# Patient Record
Sex: Female | Born: 1955 | Race: White | Hispanic: No | Smoking: Current some day smoker
Health system: Southern US, Community
[De-identification: ages and names within clinical notes are randomized; demographics above are authoritative.]

## PROBLEM LIST (undated history)

## (undated) DIAGNOSIS — G709 Myoneural disorder, unspecified: Secondary | ICD-10-CM

## (undated) DIAGNOSIS — I2109 ST elevation (STEMI) myocardial infarction involving other coronary artery of anterior wall: Secondary | ICD-10-CM

## (undated) DIAGNOSIS — E8881 Metabolic syndrome: Secondary | ICD-10-CM

## (undated) DIAGNOSIS — E669 Obesity, unspecified: Secondary | ICD-10-CM

## (undated) DIAGNOSIS — M1990 Unspecified osteoarthritis, unspecified site: Secondary | ICD-10-CM

## (undated) DIAGNOSIS — E039 Hypothyroidism, unspecified: Secondary | ICD-10-CM

## (undated) DIAGNOSIS — T7840XA Allergy, unspecified, initial encounter: Secondary | ICD-10-CM

## (undated) DIAGNOSIS — I509 Heart failure, unspecified: Secondary | ICD-10-CM

## (undated) DIAGNOSIS — F17200 Nicotine dependence, unspecified, uncomplicated: Secondary | ICD-10-CM

## (undated) DIAGNOSIS — E785 Hyperlipidemia, unspecified: Secondary | ICD-10-CM

## (undated) DIAGNOSIS — I2510 Atherosclerotic heart disease of native coronary artery without angina pectoris: Secondary | ICD-10-CM

## (undated) DIAGNOSIS — F32A Depression, unspecified: Secondary | ICD-10-CM

## (undated) DIAGNOSIS — R87619 Unspecified abnormal cytological findings in specimens from cervix uteri: Secondary | ICD-10-CM

## (undated) DIAGNOSIS — E119 Type 2 diabetes mellitus without complications: Secondary | ICD-10-CM

## (undated) DIAGNOSIS — I10 Essential (primary) hypertension: Secondary | ICD-10-CM

## (undated) DIAGNOSIS — F329 Major depressive disorder, single episode, unspecified: Secondary | ICD-10-CM

## (undated) HISTORY — DX: Depression, unspecified: F32.A

## (undated) HISTORY — DX: Essential (primary) hypertension: I10

## (undated) HISTORY — DX: Type 2 diabetes mellitus without complications: E11.9

## (undated) HISTORY — PX: TOE SURGERY: SHX1073

## (undated) HISTORY — DX: Unspecified abnormal cytological findings in specimens from cervix uteri: R87.619

## (undated) HISTORY — DX: Hyperlipidemia, unspecified: E78.5

## (undated) HISTORY — DX: Unspecified osteoarthritis, unspecified site: M19.90

## (undated) HISTORY — DX: Metabolic syndrome: E88.810

## (undated) HISTORY — DX: Atherosclerotic heart disease of native coronary artery without angina pectoris: I25.10

## (undated) HISTORY — DX: Major depressive disorder, single episode, unspecified: F32.9

## (undated) HISTORY — PX: CARDIAC CATHETERIZATION: SHX172

## (undated) HISTORY — DX: ST elevation (STEMI) myocardial infarction involving other coronary artery of anterior wall: I21.09

## (undated) HISTORY — PX: OTHER SURGICAL HISTORY: SHX169

## (undated) HISTORY — PX: ACHILLES TENDON SURGERY: SHX542

## (undated) HISTORY — DX: Hypothyroidism, unspecified: E03.9

## (undated) HISTORY — PX: EYE SURGERY: SHX253

## (undated) HISTORY — DX: Nicotine dependence, unspecified, uncomplicated: F17.200

## (undated) HISTORY — PX: HAND SURGERY: SHX662

## (undated) HISTORY — DX: Obesity, unspecified: E66.9

## (undated) HISTORY — DX: Myoneural disorder, unspecified: G70.9

## (undated) HISTORY — DX: Metabolic syndrome: E88.81

## (undated) HISTORY — DX: Allergy, unspecified, initial encounter: T78.40XA

## (undated) HISTORY — PX: TUBAL LIGATION: SHX77

---
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
## 1974-09-28 HISTORY — PX: KNEE ARTHROSCOPY: SUR90

## 1998-03-05 ENCOUNTER — Other Ambulatory Visit: Admission: RE | Admit: 1998-03-05 | Discharge: 1998-03-05 | Payer: Self-pay | Admitting: Gynecology

## 1998-03-19 ENCOUNTER — Other Ambulatory Visit: Admission: RE | Admit: 1998-03-19 | Discharge: 1998-03-19 | Payer: Self-pay | Admitting: Gynecology

## 1998-04-12 ENCOUNTER — Other Ambulatory Visit: Admission: RE | Admit: 1998-04-12 | Discharge: 1998-04-12 | Payer: Self-pay | Admitting: Gynecology

## 1998-05-14 ENCOUNTER — Other Ambulatory Visit: Admission: RE | Admit: 1998-05-14 | Discharge: 1998-05-14 | Payer: Self-pay | Admitting: Gynecology

## 1999-04-16 ENCOUNTER — Other Ambulatory Visit: Admission: RE | Admit: 1999-04-16 | Discharge: 1999-04-16 | Payer: Self-pay | Admitting: Gynecology

## 2000-06-11 ENCOUNTER — Inpatient Hospital Stay (HOSPITAL_COMMUNITY): Admission: EM | Admit: 2000-06-11 | Discharge: 2000-06-21 | Payer: Self-pay | Admitting: Emergency Medicine

## 2000-06-11 ENCOUNTER — Encounter: Payer: Self-pay | Admitting: Emergency Medicine

## 2000-06-11 ENCOUNTER — Encounter: Payer: Self-pay | Admitting: Cardiology

## 2000-06-13 ENCOUNTER — Encounter: Payer: Self-pay | Admitting: *Deleted

## 2000-06-28 ENCOUNTER — Other Ambulatory Visit: Admission: RE | Admit: 2000-06-28 | Discharge: 2000-06-28 | Payer: Self-pay | Admitting: Gynecology

## 2000-07-06 ENCOUNTER — Encounter (HOSPITAL_COMMUNITY): Admission: RE | Admit: 2000-07-06 | Discharge: 2000-10-04 | Payer: Self-pay | Admitting: Cardiology

## 2000-10-05 ENCOUNTER — Encounter (HOSPITAL_COMMUNITY): Admission: RE | Admit: 2000-10-05 | Discharge: 2001-01-03 | Payer: Self-pay | Admitting: Cardiology

## 2001-06-27 ENCOUNTER — Other Ambulatory Visit: Admission: RE | Admit: 2001-06-27 | Discharge: 2001-06-27 | Payer: Self-pay | Admitting: Gynecology

## 2002-02-10 ENCOUNTER — Encounter: Admission: RE | Admit: 2002-02-10 | Discharge: 2002-02-10 | Payer: Self-pay | Admitting: Orthopedic Surgery

## 2002-02-10 ENCOUNTER — Encounter: Payer: Self-pay | Admitting: Orthopedic Surgery

## 2002-07-11 ENCOUNTER — Other Ambulatory Visit: Admission: RE | Admit: 2002-07-11 | Discharge: 2002-07-11 | Payer: Self-pay | Admitting: Gynecology

## 2003-04-30 ENCOUNTER — Encounter: Admission: RE | Admit: 2003-04-30 | Discharge: 2003-07-29 | Payer: Self-pay | Admitting: Cardiology

## 2004-04-08 ENCOUNTER — Encounter (INDEPENDENT_AMBULATORY_CARE_PROVIDER_SITE_OTHER): Payer: Self-pay | Admitting: Specialist

## 2004-04-08 ENCOUNTER — Ambulatory Visit (HOSPITAL_COMMUNITY): Admission: RE | Admit: 2004-04-08 | Discharge: 2004-04-08 | Payer: Self-pay | Admitting: General Surgery

## 2004-08-27 ENCOUNTER — Emergency Department (HOSPITAL_COMMUNITY): Admission: EM | Admit: 2004-08-27 | Discharge: 2004-08-27 | Payer: Self-pay | Admitting: Emergency Medicine

## 2005-02-17 ENCOUNTER — Ambulatory Visit: Payer: Self-pay | Admitting: Cardiology

## 2005-02-17 ENCOUNTER — Ambulatory Visit: Payer: Self-pay

## 2005-05-05 ENCOUNTER — Ambulatory Visit: Payer: Self-pay | Admitting: Cardiology

## 2006-08-11 ENCOUNTER — Ambulatory Visit: Payer: Self-pay | Admitting: Cardiology

## 2007-09-26 ENCOUNTER — Emergency Department (HOSPITAL_COMMUNITY): Admission: EM | Admit: 2007-09-26 | Discharge: 2007-09-26 | Payer: Self-pay | Admitting: Emergency Medicine

## 2009-01-28 ENCOUNTER — Encounter: Payer: Self-pay | Admitting: Cardiology

## 2009-06-24 ENCOUNTER — Encounter: Payer: Self-pay | Admitting: Cardiology

## 2009-12-18 ENCOUNTER — Telehealth: Payer: Self-pay | Admitting: Cardiology

## 2010-02-17 ENCOUNTER — Telehealth: Payer: Self-pay | Admitting: Cardiology

## 2010-02-19 DIAGNOSIS — E119 Type 2 diabetes mellitus without complications: Secondary | ICD-10-CM | POA: Insufficient documentation

## 2010-02-19 DIAGNOSIS — E669 Obesity, unspecified: Secondary | ICD-10-CM | POA: Insufficient documentation

## 2010-02-19 DIAGNOSIS — I2510 Atherosclerotic heart disease of native coronary artery without angina pectoris: Secondary | ICD-10-CM | POA: Insufficient documentation

## 2010-02-19 DIAGNOSIS — I10 Essential (primary) hypertension: Secondary | ICD-10-CM | POA: Insufficient documentation

## 2010-02-19 DIAGNOSIS — E785 Hyperlipidemia, unspecified: Secondary | ICD-10-CM | POA: Insufficient documentation

## 2010-02-19 DIAGNOSIS — I25119 Atherosclerotic heart disease of native coronary artery with unspecified angina pectoris: Secondary | ICD-10-CM | POA: Insufficient documentation

## 2010-02-19 DIAGNOSIS — E8881 Metabolic syndrome: Secondary | ICD-10-CM | POA: Insufficient documentation

## 2010-02-19 DIAGNOSIS — I2109 ST elevation (STEMI) myocardial infarction involving other coronary artery of anterior wall: Secondary | ICD-10-CM | POA: Insufficient documentation

## 2010-02-21 ENCOUNTER — Ambulatory Visit: Payer: Self-pay | Admitting: Cardiology

## 2010-02-21 DIAGNOSIS — F17200 Nicotine dependence, unspecified, uncomplicated: Secondary | ICD-10-CM | POA: Insufficient documentation

## 2010-04-21 ENCOUNTER — Telehealth: Payer: Self-pay | Admitting: Cardiology

## 2010-05-07 ENCOUNTER — Encounter: Payer: Self-pay | Admitting: Cardiology

## 2010-05-07 ENCOUNTER — Telehealth: Payer: Self-pay | Admitting: Cardiology

## 2010-09-01 ENCOUNTER — Telehealth: Payer: Self-pay | Admitting: Cardiology

## 2010-10-30 NOTE — Progress Notes (Signed)
Summary: refill  Phone Note Refill Request Call back at [phone] Message from:  Patient on Feb 17, 2010 1:42 PM  Refills Requested: Medication #1:  PLAVIX 75 MG TABS 1 tab once daily.   Supply Requested: 3 months  Medication #2:  ENALAPRIL MALEATE 2.5 MG TABS 1 tab two times a day   Supply Requested: 3 months  Medication #3:  CARVEDILOL 12.5 MG TABS 1 tab two times a day   Supply Requested: 3 months  Medication #4:  metformin [facility]   Method Requested: Fax to Local Pharmacy Initial call taken by: Migdalia Dk,  Feb 17, 2010 1:43 PM    Prescriptions: ENALAPRIL MALEATE 2.5 MG TABS (ENALAPRIL MALEATE) 1 tab two times a day  #60 Each x 0   Entered by:   Danielle Rankin, CMA   Authorized by:   Gaylord Shih, MD, [facility]   Signed by:   Danielle Rankin, CMA on 02/17/2010   Method used:   Electronically to        [facility] (retail)       [address]       [place], [place]  [address]       Ph: [phone]       Fax: [phone]   RxID:   [id number] PLAVIX 75 MG TABS (CLOPIDOGREL BISULFATE) 1 tab once daily  #30 x 0   Entered by:   Danielle Rankin, CMA   Authorized by:   Gaylord Shih, MD, [facility]   Signed by:   Danielle Rankin, CMA on 02/17/2010   Method used:   Electronically to        [facility] (retail)       [address]       [place], [place]  [address]       Ph: [phone]       Fax: [phone]   RxID:   [id number] CARVEDILOL 12.5 MG TABS (CARVEDILOL) 1 tab two times a day  #60 Each x 0   Entered by:   Danielle Rankin, CMA   Authorized by:   Gaylord Shih, MD, [facility]   Signed by:   Danielle Rankin, CMA on 02/17/2010   Method used:   Electronically to        [facility] (retail)       [address]       [place], [place]  [address]       Ph: [phone]       Fax: [phone]   RxID:   [id number]

## 2010-10-30 NOTE — Progress Notes (Signed)
Summary: NEED SAMPLES OF PLAVIX  Phone Note Call from Patient Call back at [phone]   Caller: Patient Summary of Call: NEED SAMPLES OF PLAVIX Initial call taken by: Judie Grieve,  December 18, 2009 11:10 AM  Follow-up for Phone Call        CMA lmom for pt that samples of plavix at front desk. Danielle Rankin, CMA  December 18, 2009 12:34 PM

## 2010-10-30 NOTE — Progress Notes (Signed)
Summary: pt needs note regarding dental appt sent today  Phone Note Call from Patient Call back at [phone]   Caller: Patient Reason for Call: Talk to Nurse, Talk to Doctor Summary of Call: Dr. Harvel Ricks fax# [phone] needs a note today stating pt needs to be off plavix for 5days due to appt being in the morning to have a tooth pulled Initial call taken by: Omer Jack,  September 01, 2010 11:54 AM  Follow-up for Phone Call        Pt has scheduled 2 teeth to be extracted tomorrow.  Dr. Theora Gianotti office needs to know if she has to be off plavix for 5 days prior.  I will forward to Dr. Daleen Squibb.   Mylo Red RN     Appended Document: pt needs note regarding dental appt sent today ok to stop for 5 days, then restart.  Appended Document: pt needs note regarding dental appt sent today LMOM.  Information faxed to Dr. Harvel Ricks Mylo Red RN

## 2010-10-30 NOTE — Assessment & Plan Note (Signed)
Summary: Anna Noble   Visit Type:  3 yr f/u Primary Provider:  [facility]   CC:  no cardiac complaints today.  History of Present Illness: Miss Otho Ket times a day for evaluation and management of her history of coronary artery disease She has not been to our practice and over 3 and half years.  Her history significant for an anteroapical myocardial infarction A. by V. fib arrest and shock in 2001. At that time she had a stent placed to the LAD.  She had excellent recovery of LV function with her last EF being 58% with apical hypokinesia with mild peri-infarct ischemia. We are treating her medically.  Since I last saw her, she is really not had any symptoms of angina or ischemic equivalence. She has lost her insurance is not working. He is having a lot of trouble affording her medications.  Unfortunately, she's allergic to aspirin with hives. For that reason, she's been on Plavix long-term.  She's able to get her Vytorin by taking samples. She would like me to change her Actos plus to just plain metformin.  I just spoke to Dr. Alwyn Ren at urgent med and her hemoglobin A1c was 6.3% in September. Her creatinine was normal.  She denies orthopnea, PND or peripheral edema. She is gaining substantial weight. She still occasionally smokes but infrequently.    Current Medications (verified): 1)  Vytorin 10-40 Mg Tabs (Ezetimibe-Simvastatin) .Marland Kitchen.. 1 Tab At Bedtime 2)  Enalapril Maleate 2.5 Mg Tabs (Enalapril Maleate) .Marland Kitchen.. 1 Tab Two Times A Day 3)  Carvedilol 12.5 Mg Tabs (Carvedilol) .Marland Kitchen.. 1 Tab Two Times A Day 4)  Plavix 75 Mg Tabs (Clopidogrel Bisulfate) .Marland Kitchen.. 1 Tab Once Daily 5)  Torsemide 10 Mg Tabs (Torsemide) .... As Needed 6)  Levoxyl 75 Mcg Tabs (Levothyroxine Sodium) .... 1/2 Tab Once Daily 7)  Celexa 40 Mg Tabs (Citalopram Hydrobromide) .Marland Kitchen.. 1 Tab Once Daily 8)  Actoplus Met 15-500 Mg Tabs (Pioglitazone Hcl-Metformin Hcl) .Marland Kitchen.. 1 Tab Once Daily 9)  Nitrolingual 0.4 Mg/spray Soln  (Nitroglycerin) .... One Spray Under Tongue Every 5 Minutes As Needed For Chest Pain---May Repeat Times Three  Allergies: 1)  ! Asa 2)  ! Pcn 3)  ! Sulfa 4)  ! Nsaids  Past History:  Past Medical History: Last updated: 02/19/2010 s/p Anterior apical MI hypertension mixed hyperlipidemia type II diabetes obesity metabolic syndrome  Past Surgical History: Last updated: 02/19/2010 Tendinitis surgery Knee Arthroscopy Hand surgery  Family History: Last updated: 02/19/2010 Family History of Coronary Artery Disease:   Social History: Last updated: 02/19/2010 Married  2 children 1 w/Down's syndrome Tobacco Use - Yes.   Risk Factors: Smoking Status: current (02/19/2010)  Review of Systems       negative other than history of present illness  Vital Signs:  Patient profile:   55 year old female Height:      68 inches Weight:      219 pounds BMI:     33.42 Pulse rate:   71 / minute Pulse rhythm:   irregular BP sitting:   156 / 90  (left arm) Cuff size:   large  Vitals Entered By: Danielle Rankin, CMA (Feb 21, 2010 10:33 AM)  Physical Exam  General:  obese.  obese.   Head:  normocephalic and atraumatic Eyes:  PERRLA/EOM intact; conjunctiva and lids normal. Neck:  Neck supple, no JVD. No masses, thyromegaly or abnormal cervical nodes. Chest Timber [place]man:  no deformities or breast masses noted Lungs:  Clear bilaterally to auscultation and percussion.  Heart:  Non-displaced PMI, chest non-tender; regular rate and rhythm, S1, S2 without murmurs, rubs or gallops. Carotid upstroke normal, no bruit. Normal abdominal aortic size, no bruits. Femorals normal pulses, no bruits. Pedals normal pulses. No edema, no varicosities. Abdomen:  Bowel sounds positive; abdomen soft and non-tender without masses, organomegaly, or hernias noted. No hepatosplenomegaly. Msk:  Back normal, normal gait. Muscle strength and tone normal. Pulses:  pulses normal in all 4 extremities Extremities:  No clubbing  or cyanosis. Neurologic:  Alert and oriented x 3. Skin:  Intact without lesions or rashes. Psych:  Normal affect.   Impression & Recommendations:  Problem # 1:  CAD, NATIVE VESSEL (ICD-414.01) Assessment Unchanged  she is asymptomatic. Continue medical therapy. Her updated medication list for this problem includes:    Enalapril Maleate 5 Mg Tabs (Enalapril maleate) .Marland Kitchen... 1 two times a day    Carvedilol 12.5 Mg Tabs (Carvedilol) .Marland Kitchen... 1 tab two times a day    Plavix 75 Mg Tabs (Clopidogrel bisulfate) .Marland Kitchen... 1 tab once daily    Nitrostat 0.4 Mg Subl (Nitroglycerin) .Marland Kitchen... As directed  Orders: EKG w/ Interpretation (93000)  Her updated medication list for this problem includes:    Enalapril Maleate 2.5 Mg Tabs (Enalapril maleate) .Marland Kitchen... 1 tab two times a day    Carvedilol 12.5 Mg Tabs (Carvedilol) .Marland Kitchen... 1 tab two times a day    Plavix 75 Mg Tabs (Clopidogrel bisulfate) .Marland Kitchen... 1 tab once daily    Nitrolingual 0.4 Mg/spray Soln (Nitroglycerin) ..... One spray under tongue every 5 minutes as needed for chest pain---may repeat times three  Problem # 2:  MYOCARDIAL INFARCTION, ANTERIOR Toyna Erisman (ICD-410.10) Assessment: Unchanged  Her updated medication list for this problem includes:    Enalapril Maleate 5 Mg Tabs (Enalapril maleate) .Marland Kitchen... 1 two times a day    Carvedilol 12.5 Mg Tabs (Carvedilol) .Marland Kitchen... 1 tab two times a day    Plavix 75 Mg Tabs (Clopidogrel bisulfate) .Marland Kitchen... 1 tab once daily    Nitrostat 0.4 Mg Subl (Nitroglycerin) .Marland Kitchen... As directed  Her updated medication list for this problem includes:    Enalapril Maleate 2.5 Mg Tabs (Enalapril maleate) .Marland Kitchen... 1 tab two times a day    Carvedilol 12.5 Mg Tabs (Carvedilol) .Marland Kitchen... 1 tab two times a day    Plavix 75 Mg Tabs (Clopidogrel bisulfate) .Marland Kitchen... 1 tab once daily    Nitrolingual 0.4 Mg/spray Soln (Nitroglycerin) ..... One spray under tongue every 5 minutes as needed for chest pain---may repeat times three  Problem # 3:  HYPERTENSION  (ICD-401.9) Assessment: Deteriorated I will increase her enalapril 5 mg p.o. b.i.d. Followup blood work at urgent care per Dr. Alwyn Ren Her updated medication list for this problem includes:    Enalapril Maleate 5 Mg Tabs (Enalapril maleate) .Marland Kitchen... 1 two times a day    Carvedilol 12.5 Mg Tabs (Carvedilol) .Marland Kitchen... 1 tab two times a day    Torsemide 10 Mg Tabs (Torsemide) .Marland Kitchen... As needed  Problem # 4:  HYPERLIPIDEMIA-MIXED (ICD-272.4)  Her updated medication list for this problem includes:    Vytorin 10-40 Mg Tabs (Ezetimibe-simvastatin) .Marland Kitchen... 1 tab at bedtime  Her updated medication list for this problem includes:    Vytorin 10-40 Mg Tabs (Ezetimibe-simvastatin) .Marland Kitchen... 1 tab at bedtime  Problem # 5:  DIABETES MELLITUS, TYPE II (ICD-250.00) Her Hemoglobin A1c was 6.3%. She cannot afford her active plus. After talking to Dr. Alwyn Ren, I changed her metformin 500 mg p.o. b.i.d. Her updated medication list for this problem includes:  Enalapril Maleate 5 Mg Tabs (Enalapril maleate) .Marland Kitchen... 1 two times a day    Metformin Hcl 500 Mg Tabs (Metformin hcl) .Marland Kitchen... 1 two times a day  Problem # 6:  OBESITY (ICD-278.00) Assessment: Deteriorated I have asked her to lose weight. This is hurting her blood pressure control.  Problem # 7:  TOBACCO USER (ICD-305.1) Assessment: Unchanged advised to quit  Patient Instructions: 1)  Your physician recommends that you schedule a follow-up appointment in: YEAR WITH DR Ashaki Frosch 2)  Your physician recommends that you return for lab work in:6 WEEKS URGENT CARE FASTING BMET HGBA1C 250.00 3)  Your physician has recommended you make the following change in your medication: START METFORMIN 500 MG two times a day  4)  INCREASE ENALAPRIL TO 5 MG two times a day 5)  STOP ACTOPLUS Prescriptions: NITROSTAT 0.4 MG SUBL (NITROGLYCERIN) as directed  #25 x 4   Entered by:   Scherrie Bateman, LPN   Authorized by:   Gaylord Shih, MD, [facility]   Signed by:   Scherrie Bateman, LPN on  16/06/9603   Method used:   Electronically to        [facility] (retail)       [address]       [place], [place]  [address]       Ph: [phone]       Fax: [phone]   RxID:   [id number] METFORMIN HCL 500 MG TABS (METFORMIN HCL) 1 two times a day  #60 x 2   Entered by:   Scherrie Bateman, LPN   Authorized by:   Gaylord Shih, MD, [facility]   Signed by:   Scherrie Bateman, LPN on 07/25/2535   Method used:   Electronically to        [facility] (retail)       [address]       [place], [place]  [address]       Ph: [phone]       Fax: [phone]   RxID:   [id number] ENALAPRIL MALEATE 5 MG TABS (ENALAPRIL MALEATE) 1 two times a day  #60 x 11   Entered by:   Scherrie Bateman, LPN   Authorized by:   Gaylord Shih, MD, [facility]   Signed by:   Scherrie Bateman, LPN on 32/35/5732   Method used:   Electronically to        [facility] (retail)       [address]       [place], [place]  [address]       Ph: [phone]       Fax: [phone]   RxID:   [id number]

## 2010-10-30 NOTE — Progress Notes (Signed)
Summary: talk to Dr Daleen Squibb  Phone Note From Other Clinic   Caller: [facility] Summary of Call: Dr Patsy Lager would like to speak to Dr Daleen Squibb concerning pt medication. please call [phone] or [phone]. 1:00 until 4:30 will be in clinic. after that she will be in walk-in clinic Initial call taken by: Edman Circle,  May 07, 2010 11:26 AM  Follow-up for Phone Call        Let try to call Dr Patsy Lager today. Follow-up by: Gaylord Shih, MD, [facility],  May 08, 2010 9:25 AM

## 2010-10-30 NOTE — Progress Notes (Signed)
Summary: rx plavix  Phone Note Refill Request Message from:  Patient on April 21, 2010 4:27 PM  Refills Requested: Medication #1:  PLAVIX 75 MG TABS 1 tab once daily Send [facility]  [phone]  Initial call taken by: Judie Grieve,  April 21, 2010 4:28 PM    Prescriptions: PLAVIX 75 MG TABS (CLOPIDOGREL BISULFATE) 1 tab once daily  #30 x 11   Entered by:   Danielle Rankin, CMA   Authorized by:   Gaylord Shih, MD, [facility]   Signed by:   Danielle Rankin, CMA on 04/21/2010   Method used:   Electronically to        [facility] (retail)       [address]       [place], [place]  [address]       Ph: [phone]       Fax: [phone]   RxID:   [id number]

## 2011-02-13 NOTE — Discharge Summary (Signed)
[place]  Patient:    Anna Noble, Anna Noble                          MRN: [id number] Adm. Date:  [id number] Disc. Date: 06/21/00 Attending:  Mirian Mo Dictator:   Delton See, P.A. CC:         Feliciana Rossetti, M.D.  Esperanza Richters, M.D.   Discharge Summary  HISTORY OF PRESENT ILLNESS:  [name] is a 55 year old married female, mother of two, one with down syndrome who has a history of hypertension and hyperlipidemia, positive family history of coronary artery disease, and a history of tobacco abuse.  She had acute onset of chest pain into both arms around 11 p.m. on the evening of admission when she was lying down to go to bed.  Her husband called EMS.  Prehospital EKG showed an acute anterolateral MI.  The patient was brought to [facility].  She was hypertensive and tachycardic.  She subsequently went into ventricular tachycardia and fibrillated in the emergency room.  She apparently was shocked twice per the emergency room verbal report.  She was given amiodarone a 300 mg bolus and subsequently placed on a drip by Dr. Daleen Squibb.  The patient remained extremely tachycardic in sinus tachycardia with rates 130 to 150 with systolic pressure of 180 to 220 in the emergency room.  She received intravenous Lopressor and was placed on a nitroglycerin drip.  The patient has a history of aspirin allergy which caused hives and a coma according to her husband. She was placed on Plavix in lieu of aspirin therapy.  She is also allergic to penicillin and sulfa.  The patient is placed in the comma study.  Baseline labs showed most studies to be within normal limits except for an elevated white count of 16.8 thousand.  The patient was taken to the cardiac catheterization lab and her cath revealed severe anterior apical and inferior apical hypokinesis.  Her ejection fraction was estimated to be 35 to 40%.  She had a high grade mid  LAD lesion and underwent PTCA stenting of the LAD performed by Dr. Gerri Spore.  PAST MEDICAL HISTORY:  Please see above, as noted, the patient has a history of hypertension and hyperlipidemia as well as a history of tobacco use and positive family history of coronary artery disease.  She also has a true aspirin allergy.  She is status post minor orthopedic surgeries of the right foot and hand as well as knee surgery.  ALLERGIES:  ASPIRIN, PENICILLIN, SULFA.  MEDICATIONS: 1. Lipitor 10 mg daily. 2. Ziac dosage unknown. 3. Hormone replacement therapy.  HOSPITAL COURSE:  As noted this patient presented with an acute anterior MI and was taken emergently to the cardiac catheterization laboratory where she underwent PTCA stenting of the LAD.  She was noted to have some left ventricular dysfunction with an EF of 35 to 40%.  There were no other high grade lesions.  Please see Dr. Wanita Chamberlain dictated report for full details. The patient did require an intra-aortic balloon pump following the procedure. This was discontinued on June 12, 2000.  The patients medications were adjusted and she gradually improved.  She did have a fever which was felt to be due to an IV site infection that was treated with Vancomycin.  The patient was placed on Coumadin therapy secondary to her large MI.  Arrangements were eventually made to discharge the patient on June 21, 2000, in improved condition.  LABORATORY DATA:  On the day of discharge her INR was 2.2.  A CBC on September 23, revealed hemoglobin 13.4, hematocrit 39, WBC 14.5 thousand, platelets 520,000.  Cardiac enzymes peaked on September 14, with a CK of 4740, MB 483, relative index 10.2.  A lipid profile on September 14 revealed cholesterol 169, triglycerides 279, HDL 35, and LDL 78.  A urine culture revealed greater than 100,000 colonies of Enterococcus sensitive to all medications tested. A chest x-ray showed significant improvement in  pulmonary edema with only a minimal interstitial edema persisting.  DISCHARGE MEDICATIONS: 1. Altace 2.5 mg b.i.d. 2. Lipitor 10 mg each day. 3. Plavix 75 mg daily. 4. Coumadin 5 mg each day. 5. Coreg 6.25 mg b.i.d. 6. Lanoxin 0.25 mg daily. 7. Sublingual nitroglycerin p.r.n. for chest pain.  FOLLOW-UP:  The patient is to follow up with Dr. Daleen Squibb.  She is to follow up with Dr. Esperanza Richters in approximately two weeks.  She is also to follow up in the Coumadin Clinic.  DISCHARGE DIAGNOSES:  1. Status post anterior myocardial infarction complicated by ventricular     fibrillation and arrest.  2. History of hypertension with hypotension this admission.  3. Elevated lipids.  4. Positive family history of coronary artery disease.  5. History of tobacco use.  6. Coumadin therapy secondary to the myocardial infarction.  7. Status post percutaneous transluminal coronary angioplasty stenting of     the left anterior descending and subsequent balloon pump therapy.  8. Left ventricular dysfunction with ejection fraction estimated to be     33% at time of catheterization.  9. History of aspirin allergy. 10. Mild anemia. DD:  06/21/00 TD:  06/21/00 Job: [id number]

## 2011-02-13 NOTE — Assessment & Plan Note (Signed)
[facility]                              CARDIOLOGY OFFICE NOTE   NAME:BENNER, LESSIE                          MRN:          [id number]  DATE:08/11/2006                            DOB:          07-Mar-1956    Mrs. Otho Ket returns today for management of the following issues:  1. Coronary artery disease status post anterior apical MI with good      recovery of LV function.  The last stress Myoview on Feb 17, 2006      revealed an EF of 58%, apical hypokinesia, mild peri-infarct ischemia      at the base and also towards the apex.  She is asymptomatic, having no      angina.  2. Hypertension, under good control.  3. Mixed hyperlipidemia.  Blood work April 30, 2006 showed a total      cholesterol of 162, triglycerides of 283, HDL 42, VLDL 57, LDL 63.      Hemoglobin A1C was 6.1%.  TSH normal.  CMP normal.  4. Type 2 diabetes.  5. Obesity.  She has lost 15 pounds in 2 years.   Her exercise is limited by her right knee arthritis.   MEDICATIONS:  1. Plavix 75 mg a day.  2. Foltx one daily.  3. Torsemide 10 mg a day.  4. Enalapril 2.5 b.i.d.  5. Coreg 12.5 b.i.d.  6. Levoxyl 25 mcg two tablets Monday through Friday and one tablet on      Saturday and Sunday.  7. Lexapro 20 mg a day.  8. Actoplus/Met 15/500 b.i.d.  9. Vytorin 10/40 daily.   PHYSICAL EXAMINATION:  VITAL SIGNS:  Her blood pressure is 108/67.  Her  pulse is 70.  EKG shows sinus rhythm, low voltage, no changes.  Her weight  is 215.  HEENT:  Normocephalic and atraumatic.  Pupils equal, round and reactive to  light and accommodation.  Extraocular movements intact.  Facial symmetry is  normal.  Dentition is satisfactory.  NECK:  Carotid upstrokes are equal bilaterally without bruits.  There is no  JVD.  Thyroid is not enlarged.  Trachea is midline.  LUNGS:  Clear.  HEART:  Soft S1 and S2 without gallop, rub or murmur.  ABDOMEN:  Soft with good bowel sounds.  EXTREMITIES:  No edema.   Pulses are intact.  SKIN:  No ecchymosis and no worrisome areas.  MUSCULOSKELETAL:  Fairly benign.  NEUROLOGIC:  Intact.   ASSESSMENT AND PLAN:  Itzell is doing well from a cardiovascular standpoint.  I have encouraged her to continue to lose weight.  I think this will improve  her HDL, VLDL and triglycerides.  I have made no adjustments in her  medicines.  I have renewed her carvedilol and sublingual nitroglycerin.  Will see her back in May of 2008 for a stress Myoview and clinical followup.     Thomas C. Daleen Squibb, MD, [facility]  Electronically Signed    TCW/MedQ  DD: 08/11/2006  DT: 08/11/2006  Job #: [id number]   cc:   Tracey Harries, M.D.

## 2011-02-13 NOTE — H&P (Signed)
[place]  Patient:    Anna Noble, Anna Noble                          MRN: [id number] Adm. Date:  [id number] Attending:  Mirian Mo CC:         Feliciana Rossetti, M.D.             Florinda Marker, [facility]             [facility]                         History and Physical  CHIEF COMPLAINT:  Sudden onset of chest tightness radiating to both arms.  HISTORY OF PRESENT ILLNESS:  [name] is a 55 year old married white female, mother of two, one with Downs syndrome, who has a history of hypertension, hyperlipidemia, family history of coronary disease, and tobacco use.  She had the acute onset of chest pain going into both arms around 11 p.m. when she was lying down to go to bed.  Her husband called EMS. Prehospital EKG diagnosed acute anterolateral MI.  Upon arrival to [facility], she was hypertensive and tachycardic.  She subsequently went into ventricular tachycardia and fibrillated in the emergency room.  She was apparently shocked twice per the emergency room verbal report.  She was given amiodarone 300 mg bolus and subsequently placed on a drip by myself.  She remained extremely tachycardic with sinus tachycardia, rates of 130-150, with systolic pressures of 180-220 in the emergency room.  We gave her intravenous Lopressor 15 mg.  We increased her nitroglycerin drip to 15 drops. She is allergic to ASPIRIN, which causes hives and caused a coma, per her husband, in the past.  We gave her Plavix 375 mg p.o.  She is also apparently allergic to PENICILLIN and SULFA.  She was placed in the COMMA study.  Baseline laboratory data was within normal limits except for a white count of 16.8 thousand.  Platelets were adequate. Serum electrolytes were stable.  Creatinine was 1.0.  Chest x-ray showed no acute disease and no cardiomegaly.  Upon arrival to the catheterization lab, she complained of being  short of breath.  Lasix 40 mg IV was given.  After informed consent, she underwent emergency cardiac catheterization.  This showed severe anterior apical and inferior apical hypokinesia.  Ejection fraction in the range of 35-40%.  In addition, she had a high-grade mid-LAD lesion with TIMI 1 to TIMI 2 flow.  She is in the process of getting intervention at present.  ALLERGIES:  PENICILLIN, SULFA, and ASPIRIN.  Aspirin causes hives and subsequently caused a coma, per the husband, on one occasion.  Therefore, it was avoided by EMS and by [place].  Plavix was substituted.  PAST MEDICAL HISTORY:  There is no previous history of stroke, MI, previous coronary disease, hepatic or renal disease.  She denies any history of bleeding disorders.  PAST SURGICAL HISTORY:  Surgical procedures include minor orthopedic surgeries of her right foot and right hand.  She has had her knee operated on as well.  MEDICATIONS:  Lipitor 10 mg a day, Ziac unknown dose one p.o. q.d., and Activella, which is a hormonal replacement pill.  CARDIAC RISK FACTORS:  Family history with her father having coronary disease in his 72s, hypertension, hyperlipidemia, tobacco use of a pack per day.  REVIEW OF SYSTEMS:  Otherwise noncontributory.  PHYSICAL EXAMINATION:  GENERAL:  She was in acute distress at the time of my arrival in the emergency room.  Skin was mildly clammy.  She was very tachycardic, as mentioned above, and her blood pressure was 180/100.  Saturations were 98% on nonrebreather.  NECK:  There was no obvious JVD.  Carotids could not be heard but were very brisk and full bilaterally.  HEART:  A summation gallop.  There was no obvious murmur or rub.  LUNGS:  Crackles in the bases.  ABDOMEN:  Soft with good bowel sounds.  There was no hepatomegaly.  EXTREMITIES:  Good femoral pulses, with good distal pulses.  There were no obvious femoral bruits.  There was no edema.  There were no venous  cords.  NEUROLOGIC:  Grossly intact.  ASSESSMENT: 1. Acute anterolateral myocardial infarction with electrical instability with    ventricular tachycardia with subsequent ventricular fibrillation arrest.    The patient was cardioverted x 2 and successfully treated with IV    amiodarone. 2. Extreme sinus tachycardia with hypertension. 3. Hyperlipidemia. 4. Tobacco use. 5. History of hypertension. 6. Family history of coronary disease. 7. PENICILLIN, SULFA, and ASPIRIN allergy.  PLAN: 1. Acute catheterization as mentioned above. 2. Cardiac risk factor modification, including no smoking.  As stated above, the patient is in the COMMA study. DD:  06/11/00 TD:  06/11/00 Job: [id number]

## 2011-02-13 NOTE — Cardiovascular Report (Signed)
. [facility]  Patient:    Anna Noble, Anna Noble                          MRN: [id number] Proc. Date: 06/11/00 Adm. Date:  [id number] Attending:  Mirian Mo CC:         Feliciana Rossetti, M.D.  Thomas C. Wall, M.D. [facility]  Cardiac Catheterization Laboratory   Cardiac Catheterization  PROCEDURES PERFORMED: 1. Left heart catheterization with coronary angiography, left    ventriculography, and abdominal aortography. 2. Intra-aortic balloon pump insertion. 3. Percutaneous transluminal coronary angioplasty with stent placement in the    mid left anterior descending artery.  INDICATIONS:  Ms. Otho Ket is a 55 year old woman who presented to the emergency room with an acute extensive anterolateral myocardial infarction.  She suffered ventricular fibrillation in the emergency room requiring defibrillation.  She was stabilized on amiodarone drip and started on intravenous heparin.  She was brought emergently to the catheterization laboratory.  DESCRIPTION OF PROCEDURE:  A 7 French sheath was placed in the right femoral artery.  Standard Judkins 6 French catheters were utilized.  Contrast was Hexabrix.  There were no complications.  RESULTS:  HEMODYNAMICS:  Left ventricular pressure 114/25.  Aortic pressure 116/86. There was no aortic valve gradient.  LEFT VENTRICULOGRAM:  There is severe akinesis of the anterior and apical walls as well as akinesis of the inferior apical wall.  Ejection fraction calculated at 33%.  No mitral regurgitation.  ABDOMINAL AORTOGRAM:  Abdominal aortogram reveals mild atherosclerotic disease of the distal abdominal aorta at the bifurcation, otherwise the abdominal aorta, renal arteries and iliac arteries were normal.  CORONARY ARTERIOGRAPHY:  (Right dominant).  Left main:  Normal.  Left anterior descending:  The left anterior descending has a 99% stenosis in the mid vessel with a linear filling defect consistent with  thrombus.  Beyond this lesion just after a large second diagonal is a diffuse 50% stenosis. There is a normal sized diagonal rising from the proximal LAD which has a 40% stenosis.  There is a large second diagonal arising from the mid LAD just distal to the 99% stenosis.  There is a small third diagonal.  Left circumflex:  The left circumflex gives rise to a large branching ramus intermedius and a small obtuse marginal branch.  The ramus intermedius has a 30% stenosis proximally.  Right coronary artery:  The right coronary artery has a 30% stenosis in the proximal vessel and a 20% stenosis in the distal vessel.  It gives rise to a normal sized posterior descending artery and a normal sized posterolateral branch.  IMPRESSION: 1. Significantly decreased left ventricular systolic function secondary to    extensive apical lateral myocardial infarction. 2. One-vessel coronary artery disease with the culprit lesion being the    99% stenosis in the mid left anterior descending with TIMI-2 flow.  PLAN:  Percutaneous intervention with intra-aortic balloon pump support.  PERCUTANEOUS TRANSLUMINAL CORONARY ANGIOPLASTY PROCEDURE:  Following completion of diagnostic catheterization, we preceded directly with coronary intervention.  We initially placed an 8 French 40 cc intra-aortic balloon pump via the left femoral artery for hemodynamic support.  Heparin and ReoPro were administered per protocol.  We used a 7 [place] JL 3.5 guiding catheter and BMW wire.  The lesion was initially dilated with a 2.5 x 15 mm Maverick balloon, inflated to 6 atmospheres.  We then deployed a 2.5 x 13 mm Bx Velocity stent at 18 atmospheres.  After stent  deployment there is some mild haziness just distal to the stent and we used the stent balloon across this area of haziness distal to the stent and inflated it to 4 atmospheres.  Final angiographic images revealed patency of the LAD with 0% residual stenosis at the  primary lesion site and TIMI-3 flow into the distal vessel.  COMPLICATIONS:  None.  RESULTS:  Successful percutaneous transluminal coronary angioplasty with stent placement in the mid left anterior descending reducing a 99% stenosis with thrombus with TIMI-2 flow to 0% residual with TIMI-3 flow.  PLAN:  ReoPro will be continued for 12 hours.  The patient will be continued in intra-aortic balloon pump support for approximately 36 hours as dictated by her hemodynamics.  We will resume heparin after sheath removal and anticipate starting short-term Coumadin.  The patient will be treated with Plavix indefinitely as she is allergic to ASPIRIN. DD:  06/11/00 TD:  06/11/00 Job: [id number]

## 2011-03-16 ENCOUNTER — Other Ambulatory Visit: Payer: Self-pay | Admitting: *Deleted

## 2011-03-16 ENCOUNTER — Telehealth: Payer: Self-pay | Admitting: Cardiology

## 2011-03-16 MED ORDER — ENALAPRIL MALEATE 5 MG PO TABS: 5.0000 mg | ORAL_TABLET | Freq: Two times a day (BID) | ORAL | Status: DC

## 2011-03-16 NOTE — Telephone Encounter (Signed)
Enalapril 5 mg. [facility] on [address]

## 2011-04-02 ENCOUNTER — Other Ambulatory Visit: Payer: Self-pay | Admitting: Cardiology

## 2011-06-05 ENCOUNTER — Other Ambulatory Visit: Payer: Self-pay | Admitting: Cardiology

## 2011-10-19 ENCOUNTER — Ambulatory Visit: Payer: Self-pay

## 2011-10-19 ENCOUNTER — Other Ambulatory Visit: Payer: Self-pay | Admitting: *Deleted

## 2011-10-19 DIAGNOSIS — IMO0001 Reserved for inherently not codable concepts without codable children: Secondary | ICD-10-CM

## 2011-10-19 DIAGNOSIS — R52 Pain, unspecified: Secondary | ICD-10-CM

## 2011-10-19 DIAGNOSIS — F339 Major depressive disorder, recurrent, unspecified: Secondary | ICD-10-CM

## 2011-10-19 MED ORDER — CARVEDILOL 12.5 MG PO TABS: 12.5000 mg | ORAL_TABLET | Freq: Two times a day (BID) | ORAL | Status: DC

## 2011-10-19 MED ORDER — ENALAPRIL MALEATE 5 MG PO TABS: 5.0000 mg | ORAL_TABLET | Freq: Two times a day (BID) | ORAL | Status: DC

## 2011-10-28 ENCOUNTER — Telehealth: Payer: Self-pay | Admitting: Cardiology

## 2011-10-28 ENCOUNTER — Other Ambulatory Visit: Payer: Self-pay | Admitting: *Deleted

## 2011-10-28 MED ORDER — ENALAPRIL MALEATE 5 MG PO TABS: 5.0000 mg | ORAL_TABLET | Freq: Two times a day (BID) | ORAL | Status: DC

## 2011-10-28 NOTE — Telephone Encounter (Signed)
Pt at pharmacy [facility] [address] needs refill of enalapril

## 2011-10-28 NOTE — Telephone Encounter (Signed)
Pt waiting at pharmacy needing refill enalapril

## 2011-11-05 ENCOUNTER — Other Ambulatory Visit: Payer: Self-pay | Admitting: *Deleted

## 2011-12-30 ENCOUNTER — Other Ambulatory Visit: Payer: Self-pay | Admitting: Cardiology

## 2012-02-02 ENCOUNTER — Other Ambulatory Visit: Payer: Self-pay | Admitting: Cardiology

## 2012-02-03 ENCOUNTER — Telehealth: Payer: Self-pay | Admitting: Cardiology

## 2012-02-03 MED ORDER — CARVEDILOL 12.5 MG PO TABS: 12.5000 mg | ORAL_TABLET | Freq: Two times a day (BID) | ORAL | Status: DC

## 2012-02-03 NOTE — Telephone Encounter (Signed)
Carvedilol and enalapril 90 day supply with refills [facility] [address]

## 2012-03-02 ENCOUNTER — Other Ambulatory Visit: Payer: Self-pay | Admitting: Cardiology

## 2012-03-08 ENCOUNTER — Other Ambulatory Visit: Payer: Self-pay | Admitting: Cardiology

## 2012-03-09 ENCOUNTER — Other Ambulatory Visit: Payer: Self-pay | Admitting: *Deleted

## 2012-03-09 ENCOUNTER — Other Ambulatory Visit: Payer: Self-pay | Admitting: Cardiology

## 2012-03-09 MED ORDER — ENALAPRIL MALEATE 5 MG PO TABS: 5.0000 mg | ORAL_TABLET | Freq: Two times a day (BID) | ORAL | Status: DC

## 2012-03-09 NOTE — Telephone Encounter (Signed)
I spoke with Anna Noble and she is aware enalapril called in. Appt confirmed with Dr. Daleen Squibb  I have placed assistance form at the front desk for Anna Noble to pick up tomorrow. She states she is self pay and worried she could not afford office visit with Dr. Daleen Squibb. She could not see her PCP today and is out of her enalapril. Mylo Red RN

## 2012-03-09 NOTE — Telephone Encounter (Signed)
I spoke with pt about her enalapril. She has not been seen in office in over 2 years. Per Dr. Daleen Squibb refill request, pt needs to be seen before anymore refills can be made or pt needs to see pcp to prescribe enalapril (02/03/12 telephone refill note) Pt is aware. She will see pcp today.  Pt agrees to make appt also with Dr. Daleen Squibb I have made her an appt with Dr. Daleen Squibb for 04/12/12 10:30 am.  LMOVM about appt. Date & time  Mylo Red RN

## 2012-03-09 NOTE — Telephone Encounter (Signed)
New Problem:    Patient called in needing a refill of her enalapril (VASOTEC) 5 MG tablet and it has been 2+ years since her last visit with Dr. Daleen Squibb and would like to know how to proceed.  Please call back.

## 2012-03-09 NOTE — Telephone Encounter (Signed)
Fu call Pt called back and said appt with dr wall would be fine

## 2012-03-09 NOTE — Telephone Encounter (Signed)
Out of pills and she has appt with dr wall on [phone]

## 2012-04-01 ENCOUNTER — Encounter: Payer: Self-pay | Admitting: *Deleted

## 2012-04-02 ENCOUNTER — Emergency Department (HOSPITAL_COMMUNITY)
Admission: EM | Admit: 2012-04-02 | Discharge: 2012-04-02 | Disposition: A | Discharge status: Home / Self Care | Payer: Self-pay | Attending: Emergency Medicine | Admitting: Emergency Medicine

## 2012-04-02 ENCOUNTER — Emergency Department (HOSPITAL_COMMUNITY): Payer: Self-pay

## 2012-04-02 ENCOUNTER — Encounter (HOSPITAL_COMMUNITY): Payer: Self-pay | Admitting: *Deleted

## 2012-04-02 DIAGNOSIS — S99921A Unspecified injury of right foot, initial encounter: Secondary | ICD-10-CM

## 2012-04-02 DIAGNOSIS — I10 Essential (primary) hypertension: Secondary | ICD-10-CM | POA: Insufficient documentation

## 2012-04-02 DIAGNOSIS — E669 Obesity, unspecified: Secondary | ICD-10-CM | POA: Insufficient documentation

## 2012-04-02 DIAGNOSIS — S8990XA Unspecified injury of unspecified lower leg, initial encounter: Secondary | ICD-10-CM | POA: Insufficient documentation

## 2012-04-02 DIAGNOSIS — I252 Old myocardial infarction: Secondary | ICD-10-CM | POA: Insufficient documentation

## 2012-04-02 DIAGNOSIS — F17200 Nicotine dependence, unspecified, uncomplicated: Secondary | ICD-10-CM | POA: Insufficient documentation

## 2012-04-02 DIAGNOSIS — W108XXA Fall (on) (from) other stairs and steps, initial encounter: Secondary | ICD-10-CM | POA: Insufficient documentation

## 2012-04-02 DIAGNOSIS — E785 Hyperlipidemia, unspecified: Secondary | ICD-10-CM | POA: Insufficient documentation

## 2012-04-02 DIAGNOSIS — E119 Type 2 diabetes mellitus without complications: Secondary | ICD-10-CM | POA: Insufficient documentation

## 2012-04-02 DIAGNOSIS — I2510 Atherosclerotic heart disease of native coronary artery without angina pectoris: Secondary | ICD-10-CM | POA: Insufficient documentation

## 2012-04-02 DIAGNOSIS — E8881 Metabolic syndrome: Secondary | ICD-10-CM | POA: Insufficient documentation

## 2012-04-02 MED ORDER — OXYCODONE-ACETAMINOPHEN 5-325 MG PO TABS
2.0000 | ORAL_TABLET | Freq: Once | ORAL | Status: AC
  Administered 2012-04-02: 2 via ORAL
  Filled 2012-04-02 (×2): qty 1

## 2012-04-02 MED ORDER — OXYCODONE-ACETAMINOPHEN 5-325 MG PO TABS: 1.0000 | ORAL_TABLET | Freq: Four times a day (QID) | ORAL | Status: DC | PRN

## 2012-04-02 NOTE — ED Notes (Signed)
Pt fell on steps last night, sustaining injury to R foot. Pt presents w/ bruising, redness, abrasions, and swelling to R great toe.

## 2012-04-02 NOTE — ED Provider Notes (Signed)
History     CSN: [id number]  Arrival date & time 04/02/12  4098   First MD Initiated Contact with Patient 04/02/12 [phone]      Chief Complaint  Patient presents with  . Foot Injury    (Consider location/radiation/quality/duration/timing/severity/associated sxs/prior treatment) HPI Comments: Patient reports that yesterday afternoon she fell walking down a flight of concrete stairs and twisted her right foot and bent her 1st and 2nd toes backwards.  She is currently having pain and swelling of the 1st and 2nd toes.  She is also having pain over the 1st and 2nd metatarsals.   Patient reports that she has fractured her right great toe three times in the past.  She denies any numbness or tingling.  She has not taken anything for pain.    Patient is a 56 y.o. female presenting with foot injury. The history is provided by the patient.  Foot Injury  The incident occurred yesterday. The pain is present in the right foot and right toes. The quality of the pain is described as throbbing. The pain is moderate. The pain has been constant since onset. Pertinent negatives include no numbness, no inability to bear weight, no loss of sensation and no tingling. She reports no foreign bodies present. The symptoms are aggravated by bearing weight and palpation. She has tried nothing for the symptoms.    Past Medical History  Diagnosis Date  . Coronary atherosclerosis of native coronary artery   . Type II or unspecified type diabetes mellitus without mention of complication, not stated as uncontrolled   . Other and unspecified hyperlipidemia   . Unspecified essential hypertension   . Dysmetabolic syndrome X   . Acute myocardial infarction of other anterior wall, episode of care unspecified   . Obesity, unspecified   . Tobacco use disorder     Past Surgical History  Procedure Date  . Tendinitis surgery   . Knee arthroscopy   . Hand surgery     Family History  Problem Relation Age of Onset  .  Coronary artery disease    . Down syndrome      child    History  Substance Use Topics  . Smoking status: Current Everyday Smoker    Types: Cigarettes  . Smokeless tobacco: Never Used  . Alcohol Use: Yes     twice a week.    OB History    Grav Para Term Preterm Abortions TAB SAB Ect Mult Living                  Review of Systems  Constitutional: Negative for fever and chills.  Gastrointestinal: Negative for nausea and vomiting.  Musculoskeletal: Positive for joint swelling.  Skin: Positive for color change and wound.  Neurological: Negative for tingling and numbness.  All other systems reviewed and are negative.    Allergies  Aspirin; Latex; Nsaids; Penicillins; and Sulfonamide derivatives  Home Medications   Current Outpatient Rx  Name Route Sig Dispense Refill  . CARVEDILOL 12.5 MG PO TABS Oral Take 1 tablet (12.5 mg total) by mouth 2 (two) times daily with a meal. 180 tablet 0    Per physician there will be no more refills until  ...  . CITALOPRAM HYDROBROMIDE 40 MG PO TABS Oral Take 40 mg by mouth daily.    . ENALAPRIL MALEATE 5 MG PO TABS  TAKE ONE TABLET BY MOUTH TWICE DAILY 60 tablet 6  . EZETIMIBE-SIMVASTATIN 10-40 MG PO TABS Oral Take 1 tablet by mouth at  bedtime.    Marland Kitchen LEVOTHYROXINE SODIUM 75 MCG PO TABS Oral Take 75 mcg by mouth daily.    Marland Kitchen METFORMIN HCL 500 MG PO TABS Oral Take 1,000 mg by mouth 2 (two) times daily with a meal.     . PLAVIX 75 MG PO TABS  TAKE ONE TABLET BY MOUTH EVERY DAY 90 each 3  . NITROGLYCERIN 0.4 MG SL SUBL Sublingual Place 0.4 mg under the tongue every 5 (five) minutes as needed.      BP 152/76  Pulse 81  Temp 98.1 F (36.7 C) (Oral)  Resp 16  SpO2 97%  Physical Exam  Nursing note and vitals reviewed. Constitutional: She appears well-developed and well-nourished. No distress.  HENT:  Head: Normocephalic and atraumatic.  Neck: Normal range of motion. Neck supple.  Cardiovascular: Normal rate, regular rhythm and normal  heart sounds.   Pulses:      Dorsalis pedis pulses are 2+ on the right side, and 2+ on the left side.  Pulmonary/Chest: Effort normal and breath sounds normal.  Musculoskeletal:       Left shoulder: She exhibits normal range of motion, no tenderness and no bony tenderness.       Left elbow: She exhibits normal range of motion, no swelling and no deformity. no tenderness found.       Left wrist: She exhibits normal range of motion, no bony tenderness, no swelling and no deformity.       Right ankle: Normal. She exhibits normal range of motion, no swelling, no deformity and normal pulse. no tenderness. Achilles tendon normal.       Tenderness to palpation of the dorsal right foot.  Decreased ROM of the 1st and 2nd toe.  Mild swelling of the great toe.  Neurological: She is alert. No sensory deficit.  Skin: She is not diaphoretic.       Small abrasions of the dorsal aspect of the right great toe.  Several superficial abrasions of the left forearm.    Psychiatric: She has a normal mood and affect.    ED Course  Procedures (including critical care time)  Labs Reviewed - No data to display Dg Foot Complete Right  04/02/2012  *RADIOLOGY REPORT*  Clinical Data: Fall.  Mid foot pain.  Great toe pain.  Abrasions.  RIGHT FOOT COMPLETE - 3+ VIEW  Comparison: None.  Findings: Severe degenerative changes with possible ankylosis at the great toe IP joint.  Dystrophic calcifications are present in the calcaneal fat pad.  Large plantar spurs are present.  Soft tissues appear within normal limits.  On the frontal view, there is some lucency in the medial aspect of the proximal phalanx of the great toe base however this is not seen on other views.  This lucency extends beyond the margins of the proximal phalanx and probably represents artifact and summation shadows however clinically correlate for tenderness in this region.  Mild first MTP joint osteoarthritis.  IMPRESSION:  1.  No displaced fracture identified.   Lucency in the base of the proximal phalanx of the great toe medially is favored to be artifactual, associated with osteophyte. 2.  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  Original Report Authenticated By: Andreas Newport, M.D.     1. Injury of foot, right       MDM  No acute findings on foot xray.  Neurovascularly intact.  Patient given post op boot for comfort.  Patient also given short course of pain medication.  Pascal Lux [place], PA-C 04/02/12 [phone]

## 2012-04-03 NOTE — ED Provider Notes (Signed)
Medical screening examination/treatment/procedure(s) were performed by non-physician practitioner and as supervising physician I was immediately available for consultation/collaboration.  Arnol Mcgibbon, MD 04/03/12 0747 

## 2012-04-12 ENCOUNTER — Ambulatory Visit (INDEPENDENT_AMBULATORY_CARE_PROVIDER_SITE_OTHER): Payer: Self-pay | Admitting: Cardiology

## 2012-04-12 ENCOUNTER — Encounter: Payer: Self-pay | Admitting: Cardiology

## 2012-04-12 VITALS — BP 134/78 | HR 74 | Ht 67.0 in | Wt 213.0 lb

## 2012-04-12 DIAGNOSIS — I2109 ST elevation (STEMI) myocardial infarction involving other coronary artery of anterior wall: Secondary | ICD-10-CM

## 2012-04-12 DIAGNOSIS — I2510 Atherosclerotic heart disease of native coronary artery without angina pectoris: Secondary | ICD-10-CM

## 2012-04-12 DIAGNOSIS — E119 Type 2 diabetes mellitus without complications: Secondary | ICD-10-CM

## 2012-04-12 DIAGNOSIS — E785 Hyperlipidemia, unspecified: Secondary | ICD-10-CM

## 2012-04-12 DIAGNOSIS — F17200 Nicotine dependence, unspecified, uncomplicated: Secondary | ICD-10-CM

## 2012-04-12 DIAGNOSIS — E669 Obesity, unspecified: Secondary | ICD-10-CM

## 2012-04-12 DIAGNOSIS — I10 Essential (primary) hypertension: Secondary | ICD-10-CM

## 2012-04-12 MED ORDER — ATORVASTATIN CALCIUM 80 MG PO TABS: 80.0000 mg | ORAL_TABLET | Freq: Every day | ORAL | Status: DC

## 2012-04-12 NOTE — Assessment & Plan Note (Signed)
Check hemoglobin A1c on return.

## 2012-04-12 NOTE — Assessment & Plan Note (Signed)
Check labs in 6 weeks on atorvastatin.

## 2012-04-12 NOTE — Assessment & Plan Note (Signed)
Stable. I've asked her to take atorvastatin 80 mg for secondary prevention. I've asked her stop the fish oil and the garlic tablets.

## 2012-04-12 NOTE — Progress Notes (Signed)
HPI Anna Noble comes in today after 2 years absence from the practice. The time befor she was 3-1/2 years out from her last visit.  She is not taking her Vytorin. She's taking fish oil and garlic tablets. She does not have a primary care physician. She lost her insurance. She does have a job now.  She denies any angina or ischemic symptoms. She denies orthopnea, PND or edema.  She still smoking.  She's not had any blood work in several years.    Past Medical History  Diagnosis Date  . Coronary atherosclerosis of native coronary artery   . Type II or unspecified type diabetes mellitus without mention of complication, not stated as uncontrolled   . Other and unspecified hyperlipidemia   . Unspecified essential hypertension   . Dysmetabolic syndrome X   . Acute myocardial infarction of other anterior Anna Noble, episode of care unspecified   . Obesity, unspecified   . Tobacco use disorder     Current Outpatient Prescriptions  Medication Sig Dispense Refill  . carvedilol (COREG) 12.5 MG tablet Take 1 tablet (12.5 mg total) by mouth 2 (two) times daily with a meal.  180 tablet  0  . citalopram (CELEXA) 40 MG tablet Take 40 mg by mouth daily.      . enalapril (VASOTEC) 5 MG tablet TAKE ONE TABLET BY MOUTH TWICE DAILY  60 tablet  6  . gabapentin (NEURONTIN) 300 MG capsule Take 600 mg by mouth at bedtime.      . Garlic 1250 MG TABS Take by mouth daily.      Marland Kitchen levothyroxine (SYNTHROID, LEVOTHROID) 75 MCG tablet Take 37.5 mcg by mouth daily.       . metFORMIN (GLUCOPHAGE) 500 MG tablet Take 1,000 mg by mouth 2 (two) times daily with a meal.       . nitroGLYCERIN (NITROSTAT) 0.4 MG SL tablet Place 0.4 mg under the tongue every 5 (five) minutes as needed.      . Omega-3 Fatty Acids (FISH OIL) 1200 MG CAPS Take 3,600 mg by mouth daily.      Marland Kitchen PLAVIX 75 MG tablet TAKE ONE TABLET BY MOUTH EVERY DAY  90 each  3    Allergies  Allergen Reactions  . Aspirin   . Latex   . Nsaids   . Penicillins   .  Sulfonamide Derivatives     Family History  Problem Relation Age of Onset  . Coronary artery disease    . Down syndrome      child    History   Social History  . Marital Status: Divorced    Spouse Name: N/A    Number of Children: N/A  . Years of Education: N/A   Occupational History  . Not on file.   Social History Main Topics  . Smoking status: Current Everyday Smoker    Types: Cigarettes  . Smokeless tobacco: Never Used  . Alcohol Use: Yes     twice a week.  . Drug Use: No  . Sexually Active: Yes    Birth Control/ Protection: None   Other Topics Concern  . Not on file   Social History Narrative  . No narrative on file    ROS ALL NEGATIVE EXCEPT THOSE NOTED IN HPI  PE  General Appearance: well developed, well nourished in no acute distress, obese HEENT: symmetrical face, PERRLA, good dentition  Neck: no JVD, thyromegaly, or adenopathy, trachea midline Chest: symmetric without deformity Cardiac: PMI non-displaced, RRR, normal S1, S2, no  gallop or murmur Lung: clear to ausculation and percussion Vascular: all pulses full without bruits  Abdominal: nondistended, nontender, good bowel sounds, no HSM, no bruits Extremities: no cyanosis, clubbing or edema, no sign of DVT, no varicosities  Skin: normal color, no rashes Neuro: alert and oriented x 3, non-focal Pysch: normal affect  EKG Normal sinus rhythm, low-voltage, old anterior septal infarct. BMET No results found for this basename: na, k, cl, co2, glucose, bun, creatinine, calcium, gfrnonaa, gfraa    Lipid Panel  No results found for this basename: chol, trig, hdl, cholhdl, vldl, ldlcalc    CBC No results found for this basename: wbc, rbc, hgb, hct, plt, mcv, mch, mchc, rdw, neutrabs, lymphsabs, monoabs, eosabs, basosabs

## 2012-04-12 NOTE — Assessment & Plan Note (Signed)
Advised once again to quit. 

## 2012-04-12 NOTE — Patient Instructions (Addendum)
Your physician wants you to follow-up in: 1 year with Dr. Daleen Squibb.  You will receive a reminder letter in the mail two months in advance. If you don't receive a letter, please call our office to schedule the follow-up appointment.  Start Atorvastatin 80mg  daily.  Return for fasting lab work in 6 weeks.  Lipids, CMP, TSH, HgbA1c

## 2012-04-26 ENCOUNTER — Telehealth: Payer: Self-pay

## 2012-04-26 NOTE — Telephone Encounter (Signed)
Last seen here 12/2010, last labs 10/2010---can we rx ?  Looks like we should not without labs/OV... Please advise.

## 2012-04-26 NOTE — Telephone Encounter (Signed)
Pt is out of metphormine for and she would like to get a refill for 30 days until she is able to make an appt and do the a1c she doesn't have insurance and her cardiologist want her to do lab work and cant get it done until 6 weeks.

## 2012-04-26 NOTE — Telephone Encounter (Signed)
No she would need an office visit with labs before we can authorize a refill.

## 2012-04-26 NOTE — Telephone Encounter (Signed)
Patient notified and will RTC--was not wanting to come in so soon to have lab work done, but since we have not done this recently--advised she would need this first.

## 2012-04-27 ENCOUNTER — Ambulatory Visit: Payer: Self-pay | Admitting: Emergency Medicine

## 2012-04-27 VITALS — BP 147/64 | HR 77 | Temp 97.8°F | Resp 18 | Ht 67.5 in | Wt 210.0 lb

## 2012-04-27 DIAGNOSIS — I10 Essential (primary) hypertension: Secondary | ICD-10-CM

## 2012-04-27 DIAGNOSIS — E782 Mixed hyperlipidemia: Secondary | ICD-10-CM

## 2012-04-27 DIAGNOSIS — E119 Type 2 diabetes mellitus without complications: Secondary | ICD-10-CM

## 2012-04-27 MED ORDER — CYCLOBENZAPRINE HCL 5 MG PO TABS: 5.0000 mg | ORAL_TABLET | Freq: Three times a day (TID) | ORAL | Status: DC | PRN

## 2012-04-27 MED ORDER — METFORMIN HCL 500 MG PO TABS: 1000.0000 mg | ORAL_TABLET | Freq: Two times a day (BID) | ORAL | Status: DC

## 2012-04-27 MED ORDER — CITALOPRAM HYDROBROMIDE 40 MG PO TABS: 40.0000 mg | ORAL_TABLET | Freq: Every day | ORAL | Status: DC

## 2012-04-27 MED ORDER — GABAPENTIN 300 MG PO CAPS: 600.0000 mg | ORAL_CAPSULE | Freq: Every day | ORAL | Status: DC

## 2012-04-27 NOTE — Progress Notes (Signed)
Date:  04/27/2012   Name:  Anna Noble   DOB:  [birth year]   MRN:  [id number]  PCP:  No primary provider on file.    Chief Complaint: Medication Refill   History of Present Illness:  Anna Noble is a 56 y.o. very pleasant female patient who presents with the following:  Needs refill on meds.  BS running 70-130 fasting.  Last checked today.  Pending labs next month via cardiology.    Patient Active Problem List  Diagnosis  . DIABETES MELLITUS, TYPE II  . HYPERLIPIDEMIA-MIXED  . METABOLIC SYNDROME X  . OBESITY  . TOBACCO USER  . HYPERTENSION  . MYOCARDIAL INFARCTION, ANTERIOR WALL  . CAD, NATIVE VESSEL    Past Medical History  Diagnosis Date  . Coronary atherosclerosis of native coronary artery   . Type II or unspecified type diabetes mellitus without mention of complication, not stated as uncontrolled   . Other and unspecified hyperlipidemia   . Unspecified essential hypertension   . Dysmetabolic syndrome X   . Acute myocardial infarction of other anterior wall, episode of care unspecified   . Obesity, unspecified   . Tobacco use disorder     Past Surgical History  Procedure Date  . Tendinitis surgery   . Knee arthroscopy   . Hand surgery     History  Substance Use Topics  . Smoking status: Current Everyday Smoker    Types: Cigarettes  . Smokeless tobacco: Never Used  . Alcohol Use: Yes     twice a week.    Family History  Problem Relation Age of Onset  . Coronary artery disease    . Down syndrome      child    Allergies  Allergen Reactions  . Aspirin   . Latex   . Nsaids   . Penicillins   . Sulfonamide Derivatives     Medication list has been reviewed and updated.  Current Outpatient Prescriptions on File Prior to Visit  Medication Sig Dispense Refill  . atorvastatin (LIPITOR) 80 MG tablet Take 1 tablet (80 mg total) by mouth daily.  90 tablet  3  . carvedilol (COREG) 12.5 MG tablet Take 1 tablet (12.5 mg total) by mouth 2 (two) times  daily with a meal.  180 tablet  0  . citalopram (CELEXA) 40 MG tablet Take 40 mg by mouth daily.      . enalapril (VASOTEC) 5 MG tablet TAKE ONE TABLET BY MOUTH TWICE DAILY  60 tablet  6  . gabapentin (NEURONTIN) 300 MG capsule Take 600 mg by mouth at bedtime.      Marland Kitchen levothyroxine (SYNTHROID, LEVOTHROID) 75 MCG tablet Take 37.5 mcg by mouth daily.       . metFORMIN (GLUCOPHAGE) 500 MG tablet Take 1,000 mg by mouth 2 (two) times daily with a meal.       . nitroGLYCERIN (NITROSTAT) 0.4 MG SL tablet Place 0.4 mg under the tongue every 5 (five) minutes as needed.      Marland Kitchen PLAVIX 75 MG tablet TAKE ONE TABLET BY MOUTH EVERY DAY  90 each  3  . Garlic 1250 MG TABS Take by mouth daily.      . Omega-3 Fatty Acids (FISH OIL) 1200 MG CAPS Take 3,600 mg by mouth daily.        Review of Systems:  As per HPI, otherwise negative.    Physical Examination: Filed Vitals:   04/27/12 0932  BP: 147/64  Pulse: 77  Temp: 97.8  F (36.6 C)  Resp: 18   Filed Vitals:   04/27/12 0932  Height: 5' 7.5" (1.715 m)  Weight: 210 lb (95.255 kg)   Body mass index is 32.41 kg/(m^2). Ideal Body Weight: Weight in (lb) to have BMI = 25: 161.7    GEN: WDWN, NAD, Non-toxic, Alert & Oriented x 3 HEENT: Atraumatic, Normocephalic.  Ears and Nose: No external deformity. EXTR: No clubbing/cyanosis/edema NEURO: Normal gait.  PSYCH: Normally interactive. Conversant. Not depressed or anxious appearing.  Calm demeanor.  Chest;  CTA BS= COR RRR  Assessment and Plan: NIDDM Hyperlipidemia Hypertension CAD One months meds   Carmelina Dane, MD

## 2012-05-03 ENCOUNTER — Other Ambulatory Visit: Payer: Self-pay | Admitting: Cardiology

## 2012-05-04 ENCOUNTER — Telehealth: Payer: Self-pay

## 2012-05-04 MED ORDER — LEVOTHYROXINE SODIUM 75 MCG PO TABS: 37.5000 ug | ORAL_TABLET | Freq: Every day | ORAL | Status: DC

## 2012-05-04 NOTE — Telephone Encounter (Signed)
Pt states that her thyroid medication was not called in to her pharmacy during her last visit, pt would like to know if this could be sent over to the [facility] on [address].  Best#[phone] Pharmacy: [facility]

## 2012-05-04 NOTE — Telephone Encounter (Signed)
Patient advised.

## 2012-05-04 NOTE — Telephone Encounter (Signed)
Done and sent in 

## 2012-05-31 ENCOUNTER — Other Ambulatory Visit: Payer: Self-pay | Admitting: Emergency Medicine

## 2012-06-01 ENCOUNTER — Ambulatory Visit (INDEPENDENT_AMBULATORY_CARE_PROVIDER_SITE_OTHER): Payer: Self-pay | Admitting: Family Medicine

## 2012-06-01 VITALS — BP 178/82 | HR 80 | Temp 98.1°F | Resp 16 | Ht 67.0 in | Wt 213.0 lb

## 2012-06-01 DIAGNOSIS — I2510 Atherosclerotic heart disease of native coronary artery without angina pectoris: Secondary | ICD-10-CM

## 2012-06-01 DIAGNOSIS — E785 Hyperlipidemia, unspecified: Secondary | ICD-10-CM

## 2012-06-01 DIAGNOSIS — E7800 Pure hypercholesterolemia, unspecified: Secondary | ICD-10-CM

## 2012-06-01 DIAGNOSIS — E119 Type 2 diabetes mellitus without complications: Secondary | ICD-10-CM

## 2012-06-01 DIAGNOSIS — E039 Hypothyroidism, unspecified: Secondary | ICD-10-CM

## 2012-06-01 DIAGNOSIS — J0100 Acute maxillary sinusitis, unspecified: Secondary | ICD-10-CM

## 2012-06-01 DIAGNOSIS — F32A Depression, unspecified: Secondary | ICD-10-CM

## 2012-06-01 DIAGNOSIS — M797 Fibromyalgia: Secondary | ICD-10-CM

## 2012-06-01 DIAGNOSIS — I10 Essential (primary) hypertension: Secondary | ICD-10-CM

## 2012-06-01 DIAGNOSIS — F329 Major depressive disorder, single episode, unspecified: Secondary | ICD-10-CM

## 2012-06-01 LAB — COMPREHENSIVE METABOLIC PANEL
ALT: 9 U/L (ref 0–35)
AST: 16 U/L (ref 0–37)
Alkaline Phosphatase: 117 U/L (ref 39–117)
BUN: 7 mg/dL (ref 6–23)
Calcium: 9.5 mg/dL (ref 8.4–10.5)
Chloride: 104 mEq/L (ref 96–112)
Creat: 0.66 mg/dL (ref 0.50–1.10)
Potassium: 4.5 mEq/L (ref 3.5–5.3)

## 2012-06-01 LAB — POCT CBC
Hemoglobin: 13 g/dL (ref 12.2–16.2)
MCHC: 30.6 g/dL — AB (ref 31.8–35.4)
MID (cbc): 0.7 (ref 0–0.9)
MPV: 7.7 fL (ref 0–99.8)
POC Granulocyte: 6.3 (ref 2–6.9)
POC MID %: 5.7 %M (ref 0–12)
Platelet Count, POC: 213 10*3/uL (ref 142–424)
RBC: 4.53 M/uL (ref 4.04–5.48)

## 2012-06-01 LAB — LIPID PANEL
HDL: 39 mg/dL — ABNORMAL LOW (ref >39)
LDL Cholesterol: 61 mg/dL (ref 0–99)
Total CHOL/HDL Ratio: 3.8 Ratio
VLDL: 48 mg/dL — ABNORMAL HIGH (ref 0–40)

## 2012-06-01 LAB — POCT GLYCOSYLATED HEMOGLOBIN (HGB A1C): Hemoglobin A1C: 7.1

## 2012-06-01 LAB — TSH: TSH: 0.518 u[IU]/mL (ref 0.350–4.500)

## 2012-06-01 MED ORDER — CITALOPRAM HYDROBROMIDE 40 MG PO TABS: 40.0000 mg | ORAL_TABLET | Freq: Every day | ORAL | Status: DC

## 2012-06-01 MED ORDER — AZITHROMYCIN 250 MG PO TABS: ORAL_TABLET | ORAL | Status: AC

## 2012-06-01 MED ORDER — METFORMIN HCL 1000 MG PO TABS: 1000.0000 mg | ORAL_TABLET | Freq: Two times a day (BID) | ORAL | Status: DC

## 2012-06-01 MED ORDER — LEVOTHYROXINE SODIUM 75 MCG PO TABS: 37.5000 ug | ORAL_TABLET | Freq: Every day | ORAL | Status: DC

## 2012-06-01 MED ORDER — CYCLOBENZAPRINE HCL 5 MG PO TABS: 5.0000 mg | ORAL_TABLET | Freq: Three times a day (TID) | ORAL | Status: DC | PRN

## 2012-06-01 MED ORDER — GABAPENTIN 300 MG PO CAPS: 600.0000 mg | ORAL_CAPSULE | Freq: Every day | ORAL | Status: DC

## 2012-06-01 NOTE — Patient Instructions (Addendum)
1. Type II or unspecified type diabetes mellitus without mention of complication, not stated as uncontrolled  POCT glycosylated hemoglobin (Hb A1C), Lipid panel, metFORMIN (GLUCOPHAGE) 1000 MG tablet  2. Hyperlipidemia  Comprehensive metabolic panel  3. Hypothyroid  POCT CBC, TSH, levothyroxine (SYNTHROID, LEVOTHROID) 75 MCG tablet  4. Coronary artery disease    5. Depression  citalopram (CELEXA) 40 MG tablet  6. Sinusitis, acute, maxillary  azithromycin (ZITHROMAX Z-PAK) 250 MG tablet

## 2012-06-01 NOTE — Progress Notes (Signed)
Subjective:    Patient ID: Anna Noble, female    DOB: 04/12/1956, 56 y.o.   MRN: [id number]  HPIThis 56 y.o. female presents for evaluation of multiple medical problems.  1.  Sinus congestion:  Onset 1 -2 weeks; no fever/chills/sweats; no headache; no ear pain or sore throat; +some rhinorrhea; +PND and cough; +nasal congestion.  No sputum production.  Congestion yellow green.  No SOB.  Taking Zyrtec 10mg  daily.  No other medications. No sinus pressure at this time.   Zpack works well for patient in the past.  No recent antibiotics.  2.  Hyperlipidemia:  Started on Atorvastatin 80mg  daily at visit with cardiology on 2012/05/10; no side effects to medication; taking medication daily.  Atorvastatin was $23 dollars.  Denies CP/palp/SOB/leg swelling.  3.  CAD:  S/p AMI 06/11/2000; s/p stenting.  Last stress test 3-4 years ago; last catheterization 2001.  Recent cardiology appointment May 10, 2012.  Codes with AMI; took 4 shots to revive.  Compliance with Carvedilol and Enalapril and Plavix.  Allergic to ASA.  Still smoking but has decreased to 1/2ppd.  Denies CP/palp/SOB/leg swelling.   4.  Hypothyroidism:  Last labs 2012.  Compliance with Synthroid.  No side effects to medications.  No recent fatigue, skin or hair changes.    5.  DMII:  Diagnosed 8 years ago.  Taking Metformin twice daily.  Requesting for 1000mg  one twice daily.  Fasting sugars running 177; has been out of medication for 5 days.  Rare nocturia, polydipsia, polyuria.  Last eye exam 57month ago; [facility].  No diabetic retinopathy.  Small cataract.    6.  Depression:  Needs refill on Citalopram 40mg  daily; maintained on medication x 2-3 years.  Previous other SSRI for past 2001.  No excessive worry, anger, irritability.  Sleeping well.   Father committed suicide.  7. Fibromyalgia?:  Started on Gabapentin 300mg  two at bedtime.  Benny Lennert felt that patient had fibromyalgias.  Also helps back problems.      Review of Systems  Constitutional:  Negative for chills, diaphoresis, fatigue and unexpected weight change.  HENT: Positive for congestion, sneezing and postnasal drip. Negative for rhinorrhea, neck pain and neck stiffness.   Eyes: Negative for pain, discharge and itching.  Respiratory: Positive for cough. Negative for shortness of breath and stridor.   Cardiovascular: Negative for chest pain, palpitations and leg swelling.  Gastrointestinal: Negative for nausea, vomiting, diarrhea and constipation.  Musculoskeletal: Positive for back pain and arthralgias.  Neurological: Negative for dizziness, syncope, facial asymmetry, speech difficulty, weakness, light-headedness, numbness and headaches.  Psychiatric/Behavioral: Positive for dysphoric mood. Negative for suicidal ideas and disturbed wake/sleep cycle. The patient is not nervous/anxious.     Past Medical History  Diagnosis Date  . Coronary atherosclerosis of native coronary artery   . Type II or unspecified type diabetes mellitus without mention of complication, not stated as uncontrolled   . Other and unspecified hyperlipidemia   . Unspecified essential hypertension   . Dysmetabolic syndrome X   . Acute myocardial infarction of other anterior wall, episode of care unspecified   . Obesity, unspecified   . Tobacco use disorder   . Depression   . Thyroid disease     Past Surgical History  Procedure Date  . Tendinitis surgery   . Knee arthroscopy   . Hand surgery   . Eye surgery     Strabismus L eye  . Toe surgery   . Cardiac catheterization   . Cardiac stenting   . Foot  surgery     Achille's repair    Prior to Admission medications   Medication Sig Start Date End Date Taking? Authorizing Provider  atorvastatin (LIPITOR) 80 MG tablet Take 1 tablet (80 mg total) by mouth daily. 04/12/12 04/12/13 Yes Gaylord Shih, MD  carvedilol (COREG) 12.5 MG tablet TAKE ONE TABLET BY MOUTH TWICE DAILY WITH MEALS 05/03/12  Yes Gaylord Shih, MD  citalopram (CELEXA) 40 MG tablet  Take 1 tablet (40 mg total) by mouth daily. 04/27/12  Yes Phillips Odor, MD  enalapril (VASOTEC) 5 MG tablet TAKE ONE TABLET BY MOUTH TWICE DAILY 05/03/12  Yes Gaylord Shih, MD  gabapentin (NEURONTIN) 300 MG capsule Take 2 capsules (600 mg total) by mouth at bedtime. 04/27/12  Yes Phillips Odor, MD  Garlic 1250 MG TABS Take by mouth daily.   Yes Historical Provider, MD  levothyroxine (SYNTHROID, LEVOTHROID) 75 MCG tablet Take 0.5 tablets (37.5 mcg total) by mouth daily. 05/04/12  Yes Ryan M Dunn, PA-C  metFORMIN (GLUCOPHAGE) 500 MG tablet TAKE TWO TABLETS BY MOUTH TWICE DAILY WITH MEALS 05/31/12  Yes Collene Gobble, MD  nitroGLYCERIN (NITROSTAT) 0.4 MG SL tablet Place 0.4 mg under the tongue every 5 (five) minutes as needed.   Yes Historical Provider, MD  Omega-3 Fatty Acids (FISH OIL) 1200 MG CAPS Take 3,600 mg by mouth daily.   Yes Historical Provider, MD  PLAVIX 75 MG tablet TAKE ONE TABLET BY MOUTH EVERY DAY 06/05/11  Yes Gaylord Shih, MD  cyclobenzaprine (FLEXERIL) 5 MG tablet Take 1 tablet (5 mg total) by mouth 3 (three) times daily as needed. 04/27/12   Phillips Odor, MD    Allergies  Allergen Reactions  . Aspirin   . Latex   . Nsaids   . Penicillins   . Sulfonamide Derivatives     History   Social History  . Marital Status: Divorced    Spouse Name: N/A    Number of Children: N/A  . Years of Education: N/A   Occupational History  . Not on file.   Social History Main Topics  . Smoking status: Current Everyday Smoker -- 0.5 packs/day for 15 years    Types: Cigarettes  . Smokeless tobacco: Never Used  . Alcohol Use: 1.2 oz/week    2 Cans of beer per week     twice a week.  . Drug Use: No  . Sexually Active: Yes    Birth Control/ Protection: None   Other Topics Concern  . Not on file   Social History Narrative   Marital status: Divorced after 30 years of marriage in 2007.  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 Coronary artery disease    . Down syndrome      child  . Dementia Mother   . Hypertension Mother   . Diabetes Mother   . Depression Father   . Heart disease Father 45    AMI age 34  . Hypertension Brother   . Arthritis Brother        Objective:   Physical Exam  Nursing note and vitals reviewed. Constitutional: She is oriented to person, place, and time. She appears well-developed and well-nourished. No distress.  HENT:  Head: Normocephalic and atraumatic.  Right Ear:  External ear normal.  Left Ear: External ear normal.  Nose: Nose normal.  Mouth/Throat: Oropharynx is clear and moist. No oropharyngeal exudate.       +TTP MAXILLARY SINUS B L>R.  Eyes: Conjunctivae and EOM are normal. Pupils are equal, round, and reactive to light.  Neck: Normal range of motion. Neck supple. No thyromegaly present.  Cardiovascular: Normal rate, regular rhythm and normal heart sounds.   No murmur heard. Pulmonary/Chest: Effort normal and breath sounds normal. No respiratory distress. She has no wheezes. She has no rales.  Abdominal: Soft. Bowel sounds are normal. She exhibits no distension. There is no tenderness. There is no rebound and no guarding.  Musculoskeletal: Normal range of motion.  Lymphadenopathy:    She has no cervical adenopathy.  Neurological: She is alert and oriented to person, place, and time. No cranial nerve deficit or sensory deficit. She exhibits normal muscle tone.       MONOFILAMENT INTACT.  Skin: Skin is warm, dry and intact. She is not diaphoretic.       THICKENED SKIN B FEET.  Psychiatric: She has a normal mood and affect. Her behavior is normal. Judgment and thought content normal.      Results for orders placed in visit on 06/01/12    POCT CBC      Component Value Range   WBC 11.7 (*) 4.6 - 10.2 K/uL   Lymph, poc 4.8 (*) 0.6 - 3.4   POC LYMPH PERCENT 40.6  10 - 50 %L   MID (cbc) 0.7  0 - 0.9   POC MID % 5.7  0 - 12 %M   POC Granulocyte 6.3  2 - 6.9   Granulocyte percent 53.7  37 - 80 %G   RBC 4.53  4.04 - 5.48 M/uL   Hemoglobin 13.0  12.2 - 16.2 g/dL   HCT, POC 11.9  14.7 - 47.9 %   MCV 93.9  80 - 97 fL   MCH, POC 28.7  27 - 31.2 pg   MCHC 30.6 (*) 31.8 - 35.4 g/dL   RDW, POC 82.9     Platelet Count, POC 213  142 - 424 K/uL   MPV 7.7  0 - 99.8 fL  POCT GLYCOSYLATED HEMOGLOBIN (HGB A1C)      Component Value Range   Hemoglobin A1C 7.1      Assessment & Plan:   1. Type II or unspecified type diabetes mellitus without mention of complication, not stated as uncontrolled  POCT glycosylated hemoglobin (Hb A1C), Lipid panel  2. Hyperlipidemia  Comprehensive metabolic panel  3. Hypothyroid  POCT CBC, TSH  4. Coronary artery disease    5. Depression    6. Sinusitis, acute, maxillary    7.  Possible Fibromyalgia  1.  DMII:  Moderately controlled with HgbA1c of 7.7; refills of medication provided.  Monofilament intact.  Eye exam UTD.  Will warrant flu vaccine this fall.  F/u 3 months. 2. Hyperlipidemia: uncontrolled; goal LDL< 70 due to CAD.  Obtain labs.  Continue current medication. 3.  Hypothyroidism: controlled; no change in medications; refill provided.  Obtain labs. 4.  CAD: stable; asymptomatic; s/p recent evaluation by cardiology. 5.  Depression: stable; refill of Citalopram provided. 6. Acute maxillary sinusitis: New.  Rx for Zithromax provided. 7.  Possible fibromyalgia: stable on Neurontin, Flexeril.    Meds ordered this encounter  Medications  . azithromycin (ZITHROMAX Z-PAK) 250 MG tablet    Sig: 2 tablets daily x 1 day then 1 tablet daily  x 4 days    Dispense:  6 each    Refill:  0  . levothyroxine (SYNTHROID, LEVOTHROID) 75 MCG tablet    Sig: Take 0.5 tablets (37.5 mcg total) by mouth daily.     Dispense:  30 tablet    Refill:  1  . metFORMIN (GLUCOPHAGE) 1000 MG tablet    Sig: Take 1 tablet (1,000 mg total) by mouth 2 (two) times daily with a meal.    Dispense:  60 tablet    Refill:  5    Needs office visit  . citalopram (CELEXA) 40 MG tablet    Sig: Take 1 tablet (40 mg total) by mouth daily.    Dispense:  30 tablet    Refill:  5  . cyclobenzaprine (FLEXERIL) 5 MG tablet    Sig: Take 1 tablet (5 mg total) by mouth 3 (three) times daily as needed.    Dispense:  60 tablet    Refill:  5  . gabapentin (NEURONTIN) 300 MG capsule    Sig: Take 2 capsules (600 mg total) by mouth at bedtime.    Dispense:  60 capsule    Refill:  5

## 2012-06-03 ENCOUNTER — Encounter: Payer: Self-pay | Admitting: Family Medicine

## 2012-06-18 ENCOUNTER — Other Ambulatory Visit: Payer: Self-pay | Admitting: Cardiology

## 2012-07-08 NOTE — Progress Notes (Signed)
Reviewed and agree.

## 2012-07-21 ENCOUNTER — Ambulatory Visit (INDEPENDENT_AMBULATORY_CARE_PROVIDER_SITE_OTHER): Payer: Self-pay | Admitting: Internal Medicine

## 2012-07-21 VITALS — BP 156/86 | HR 106 | Temp 99.7°F | Resp 17 | Ht 67.5 in | Wt 209.0 lb

## 2012-07-21 DIAGNOSIS — L0201 Cutaneous abscess of face: Secondary | ICD-10-CM

## 2012-07-21 DIAGNOSIS — R519 Headache, unspecified: Secondary | ICD-10-CM

## 2012-07-21 DIAGNOSIS — L03211 Cellulitis of face: Secondary | ICD-10-CM

## 2012-07-21 DIAGNOSIS — R51 Headache: Secondary | ICD-10-CM

## 2012-07-21 MED ORDER — CLINDAMYCIN HCL 300 MG PO CAPS: 300.0000 mg | ORAL_CAPSULE | Freq: Three times a day (TID) | ORAL | Status: DC

## 2012-07-21 MED ORDER — HYDROCODONE-ACETAMINOPHEN 7.5-325 MG PO TABS: 1.0000 | ORAL_TABLET | Freq: Three times a day (TID) | ORAL | Status: DC | PRN

## 2012-07-21 NOTE — Patient Instructions (Addendum)
Abscess An abscess is an infected area that contains a collection of pus and debris. It can occur in almost any part of the body. An abscess is also known as a furuncle or boil. CAUSES   An abscess occurs when tissue gets infected. This can occur from blockage of oil or sweat glands, infection of hair follicles, or a minor injury to the skin. As the body tries to fight the infection, pus collects in the area and creates pressure under the skin. This pressure causes pain. People with weakened immune systems have difficulty fighting infections and get certain abscesses more often.   SYMPTOMS Usually an abscess develops on the skin and becomes a painful mass that is red, warm, and tender. If the abscess forms under the skin, you may feel a moveable soft area under the skin. Some abscesses break open (rupture) on their own, but most will continue to get worse without care. The infection can spread deeper into the body and eventually into the bloodstream, causing you to feel ill.   DIAGNOSIS   Your caregiver will take your medical history and perform a physical exam. A sample of fluid may also be taken from the abscess to determine what is causing your infection. TREATMENT   Your caregiver may prescribe antibiotic medicines to fight the infection. However, taking antibiotics alone usually does not cure an abscess. Your caregiver may need to make a small cut (incision) in the abscess to drain the pus. In some cases, gauze is packed into the abscess to reduce pain and to continue draining the area. HOME CARE INSTRUCTIONS    Only take over-the-counter or prescription medicines for pain, discomfort, or fever as directed by your caregiver.   If you were prescribed antibiotics, take them as directed. Finish them even if you start to feel better.   If gauze is used, follow your caregiver's directions for changing the gauze.   To avoid spreading the infection:   Keep your draining abscess covered with a  bandage.   Wash your hands well.   Do not share personal care items, towels, or whirlpools with others.   Avoid skin contact with others.   Keep your skin and clothes clean around the abscess.   Keep all follow-up appointments as directed by your caregiver.  SEEK MEDICAL CARE IF:    You have increased pain, swelling, redness, fluid drainage, or bleeding.   You have muscle aches, chills, or a general ill feeling.   You have a fever.  MAKE SURE YOU:    Understand these instructions.   Will watch your condition.   Will get help right away if you are not doing well or get worse.  Document Released: 06/24/2005 Document Revised: 03/15/2012 Document Reviewed: 11/27/2011 Exit[employer] Patient Information 2013 [employer].    

## 2012-07-21 NOTE — Progress Notes (Signed)
  Subjective:    Patient ID: Anna Noble, female    DOB: Aug 19, 1956, 56 y.o.   MRN: [id number]  HPI Has abscess tooth, pain, swelling right face.   Review of Systems Many issues.     Objective:   Physical Exam Red, swollen right lower cheek Abscess tooth right       Assessment & Plan:  See dentist this week Take meds as ordered/ SED

## 2012-07-25 ENCOUNTER — Telehealth: Payer: Self-pay | Admitting: Cardiology

## 2012-07-25 NOTE — Telephone Encounter (Signed)
Spoke with pt. Pt currently on antibiotics and anticipates having a tooth extracted. She is currently not scheduled for an extraction.  She states the dentist is requesting a note from Dr Daleen Squibb stating how long Dr Daleen Squibb recommends she hold Plavix prior to extraction, 3 or 5 days.  I will forward to Dr Daleen Squibb for review and recommendations. Pt is aware Dr Daleen Squibb will be in the office on Wednesday.

## 2012-07-25 NOTE — Telephone Encounter (Signed)
Pt needs to be off plavix for 5 days to get a tooth extracted she has been off since Friday 10/25 and will be going for dental work on this Friday 11/1 and she needs a letter faxed to her stating it is ok to be off the plavix for dentist

## 2012-08-03 NOTE — Telephone Encounter (Signed)
New problem:   Status of letter to dentist - regarding plavix. Dr. Lorin Picket - [facility].

## 2012-08-04 NOTE — Telephone Encounter (Signed)
LMOVM that Dr. Daleen Squibb has reviewed and pt can hold her plavix for 5 days prior to her dental extraction. Mylo Red RN

## 2012-08-08 NOTE — Telephone Encounter (Signed)
Attempted to call pt at number listed to make sure she is aware to hold plavix as instructed.  Was told she was not in and didn't know when she would be in.  Will have to continue to contact pt as this is the only number we have for the pt.

## 2012-08-09 NOTE — Telephone Encounter (Signed)
**Note De-Identified Jossie Smoot Obfuscation** Pt. advised that per Dr. Daleen Squibb she may hold Plavix for 5 days prior to extraction and to resume the evening of extraction, she verbalized understanding. Letter faxed to Dr. Geryl Councilman, pt's dentist, @ [phone].

## 2012-10-14 ENCOUNTER — Telehealth: Payer: Self-pay | Admitting: Cardiology

## 2012-10-14 NOTE — Telephone Encounter (Signed)
New problem:    Discuss statin drug

## 2012-10-14 NOTE — Telephone Encounter (Signed)
Pt returns call after speaking with [facility]. Stated that her card would not pay for it.  Searched the Lipitor web site for pt and gave her a # to call to see if she qualifies for a $4.00 prescription card. She will call today  Mylo Red RN

## 2012-10-14 NOTE — Telephone Encounter (Signed)
Pt calls today b/c her generic lipitor at [facility] is $150.00/month She has a discount card and does not think they used it.   If her atorvastatin is this expensive then, she states she cannot afford it. Would like another less expensive generic statin. Mylo Red RN

## 2012-10-14 NOTE — Telephone Encounter (Signed)
New Problem:    Patient called in wanting a cheaper alternative to for her atorvastatin (LIPITOR) 80 MG tablet because it has become too expensive.  Please call back.

## 2012-10-15 NOTE — Telephone Encounter (Signed)
Have her take simvastatin 40 mg each bedtime. Blood work in 6 weeks

## 2012-10-20 MED ORDER — SIMVASTATIN 40 MG PO TABS: 40.0000 mg | ORAL_TABLET | Freq: Every day | ORAL | Status: DC

## 2012-10-20 NOTE — Telephone Encounter (Signed)
LMOVM about switching to simvastatin.  Asked pt to call back if she would like to switch. Needs repeat labs in 6 weeks Mylo Red RN

## 2012-10-20 NOTE — Addendum Note (Signed)
Addended by: Freddi Starr on: 10/20/2012 04:29 PM   Modules accepted: Orders

## 2012-10-20 NOTE — Telephone Encounter (Signed)
Spoke with pt, simvastatin called into the pharm at pt request. She will call if too expensive. She is aware she will need labs in 6 weeks.

## 2012-11-24 ENCOUNTER — Telehealth: Payer: Self-pay

## 2012-11-24 DIAGNOSIS — M797 Fibromyalgia: Secondary | ICD-10-CM

## 2012-11-24 NOTE — Telephone Encounter (Signed)
Gabapentin rx indicates 2 at night quantity of  #60, please review patient states she is to take 3/day

## 2012-11-24 NOTE — Telephone Encounter (Signed)
PATIENT TAKES GENERIC  NEURONTIN.  SAYS SHE IS SUPPOSED TO TAKE TWO AT NIGHT AND ONE DURING THE DAY.  WHEN SHE WENT TO GET SCRIPT IT ONLY HAD 30 PILLS AND SHE DOESN'T KNOW WHAT IS GOING ON.  PLEASE CALL [phone]

## 2012-11-25 MED ORDER — GABAPENTIN 300 MG PO CAPS: ORAL_CAPSULE | ORAL | Status: DC

## 2012-11-25 NOTE — Telephone Encounter (Signed)
Pt advised she is due for f/u appt

## 2012-11-25 NOTE — Telephone Encounter (Signed)
I sent the increase dose to the pharmacy but pt needs to be told that it is time for her visit.

## 2012-12-16 ENCOUNTER — Telehealth: Payer: Self-pay

## 2012-12-16 DIAGNOSIS — E119 Type 2 diabetes mellitus without complications: Secondary | ICD-10-CM

## 2012-12-16 MED ORDER — METFORMIN HCL 1000 MG PO TABS: 1000.0000 mg | ORAL_TABLET | Freq: Two times a day (BID) | ORAL | Status: DC

## 2012-12-16 NOTE — Telephone Encounter (Signed)
Patient will have insurance after may 1st,  She is asking for metFORMIN (GLUCOPHAGE) 1000 MG tablet  till that time    CBN:  [phone]

## 2012-12-16 NOTE — Telephone Encounter (Signed)
Refill given. Please have please come in for her diabetes visit before she runs out.

## 2012-12-16 NOTE — Telephone Encounter (Signed)
Please advise, we have told patient since September she is due for office visit.

## 2012-12-16 NOTE — Telephone Encounter (Signed)
Called her to advise, she states she can not come in until May. She is advised to make appt in may

## 2013-01-08 ENCOUNTER — Other Ambulatory Visit: Payer: Self-pay | Admitting: Physician Assistant

## 2013-02-14 ENCOUNTER — Telehealth: Payer: Self-pay

## 2013-02-14 MED ORDER — GABAPENTIN 300 MG PO CAPS: ORAL_CAPSULE | ORAL | Status: DC

## 2013-02-14 NOTE — Telephone Encounter (Signed)
Pt is out of neurotin and is requesting a refill Pt uses [facility] on [address]

## 2013-02-14 NOTE — Telephone Encounter (Signed)
Rx sent to pharmacy   

## 2013-02-25 ENCOUNTER — Telehealth: Payer: Self-pay

## 2013-02-25 DIAGNOSIS — E119 Type 2 diabetes mellitus without complications: Secondary | ICD-10-CM

## 2013-02-25 DIAGNOSIS — M797 Fibromyalgia: Secondary | ICD-10-CM

## 2013-02-25 NOTE — Telephone Encounter (Signed)
Patient would like refill of metformin and cyclobenzaprine until her appointment next month.

## 2013-02-26 MED ORDER — CYCLOBENZAPRINE HCL 5 MG PO TABS: 5.0000 mg | ORAL_TABLET | Freq: Three times a day (TID) | ORAL | Status: DC | PRN

## 2013-02-26 MED ORDER — METFORMIN HCL 1000 MG PO TABS: 1000.0000 mg | ORAL_TABLET | Freq: Two times a day (BID) | ORAL | Status: DC

## 2013-02-26 NOTE — Telephone Encounter (Signed)
Rx's authorized x 1 month.  Meds ordered this encounter  Medications  . cyclobenzaprine (FLEXERIL) 5 MG tablet    Sig: Take 1 tablet (5 mg total) by mouth 3 (three) times daily as needed.    Dispense:  60 tablet    Refill:  0    Order Specific Question:  Supervising Provider    Answer:  DOOLITTLE, ROBERT P [[id number]]  . metFORMIN (GLUCOPHAGE) 1000 MG tablet    Sig: Take 1 tablet (1,000 mg total) by mouth 2 (two) times daily with a meal.    Dispense:  60 tablet    Refill:  0    Needs office visit    Order Specific Question:  Supervising Provider    Answer:  DOOLITTLE, ROBERT P [[id number]]

## 2013-02-27 NOTE — Telephone Encounter (Signed)
Pt notified that rx's was sent in

## 2013-04-20 ENCOUNTER — Ambulatory Visit (INDEPENDENT_AMBULATORY_CARE_PROVIDER_SITE_OTHER): Payer: BC Managed Care – PPO | Admitting: Cardiology

## 2013-04-20 ENCOUNTER — Encounter: Payer: Self-pay | Admitting: Cardiology

## 2013-04-20 VITALS — BP 170/82 | HR 68 | Ht 68.0 in | Wt 224.8 lb

## 2013-04-20 DIAGNOSIS — I2109 ST elevation (STEMI) myocardial infarction involving other coronary artery of anterior wall: Secondary | ICD-10-CM

## 2013-04-20 DIAGNOSIS — E7800 Pure hypercholesterolemia, unspecified: Secondary | ICD-10-CM

## 2013-04-20 DIAGNOSIS — E119 Type 2 diabetes mellitus without complications: Secondary | ICD-10-CM

## 2013-04-20 DIAGNOSIS — I2510 Atherosclerotic heart disease of native coronary artery without angina pectoris: Secondary | ICD-10-CM

## 2013-04-20 MED ORDER — ENALAPRIL MALEATE 10 MG PO TABS: ORAL_TABLET | ORAL | Status: DC

## 2013-04-20 MED ORDER — CLOPIDOGREL BISULFATE 75 MG PO TABS: ORAL_TABLET | ORAL | Status: DC

## 2013-04-20 MED ORDER — SIMVASTATIN 40 MG PO TABS: 40.0000 mg | ORAL_TABLET | Freq: Every day | ORAL | Status: DC

## 2013-04-20 MED ORDER — METFORMIN HCL 1000 MG PO TABS: 1000.0000 mg | ORAL_TABLET | Freq: Two times a day (BID) | ORAL | Status: DC

## 2013-04-20 MED ORDER — CARVEDILOL 12.5 MG PO TABS: ORAL_TABLET | ORAL | Status: DC

## 2013-04-20 MED ORDER — NITROGLYCERIN 0.4 MG SL SUBL: 0.4000 mg | SUBLINGUAL_TABLET | SUBLINGUAL | Status: DC | PRN

## 2013-04-20 NOTE — Patient Instructions (Addendum)
Please increase your Enalapril to 10 mg twice a day. Continue all other medications as listed  Please have blood work drawn at your primary care doctor's office.  Please be fasting (Lipid, hepatic and basic metabolic panel)  Orders in EPIC  Follow up in 6 months with Dr Antoine Poche.  You will receive a letter in the mail 2 months before you are due.  Please call Anna Noble when you receive this letter to schedule your follow up appointment.

## 2013-04-20 NOTE — Progress Notes (Signed)
HPI The patient presents to me. She was previously followed by Dr. Daleen Squibb. She has a history of coronary disease at catheterization 2001 demonstrating an EF of 33%. The LAD at 90% stenosis in the mid vessel. There is diffuse 50% stenosis after this. Second diagonal had mid 99% stenosis. Ramus intermediate had 30% stenosis. Right coronary artery 30% stenosis. The patient had stenting of the LAD and required a balloon pump at that time. The last stress test apparently was in 2007. Her EF was about 50%. She had apical hypokinesis and mild ischemia.  Since she was last seen she's had no chest pain. She does get some heartburn which is different than her previous angina. She does not describe substernal chest discomfort, neck or arm discomfort. She does not describe shortness of breath, PND or orthopnea. She walks for exercise. She unfortunately can't stop smoking. She has a lot of stress at work. She is fatigued.   Allergies  Allergen Reactions  . Aspirin   . Latex   . Nsaids   . Penicillins   . Sulfonamide Derivatives     Current Outpatient Prescriptions  Medication Sig Dispense Refill  . carvedilol (COREG) 12.5 MG tablet TAKE ONE TABLET BY MOUTH TWICE DAILY WITH MEALS  180 tablet  3  . citalopram (CELEXA) 40 MG tablet Take 1 tablet (40 mg total) by mouth daily.  30 tablet  5  . clopidogrel (PLAVIX) 75 MG tablet TAKE ONE TABLET BY MOUTH EVERY DAY  90 tablet  2  . cyclobenzaprine (FLEXERIL) 5 MG tablet Take 1 tablet (5 mg total) by mouth 3 (three) times daily as needed.  60 tablet  0  . enalapril (VASOTEC) 5 MG tablet TAKE ONE TABLET BY MOUTH TWICE DAILY  60 tablet  11  . gabapentin (NEURONTIN) 300 MG capsule Take one capsule by mouth in the morning and two at bedtime  90 capsule  1  . levothyroxine (SYNTHROID, LEVOTHROID) 75 MCG tablet Take 0.5 tablets (37.5 mcg total) by mouth daily.  30 tablet  1  . metFORMIN (GLUCOPHAGE) 1000 MG tablet Take 1 tablet (1,000 mg total) by mouth 2 (two) times  daily with a meal.  60 tablet  0  . nitroGLYCERIN (NITROSTAT) 0.4 MG SL tablet Place 0.4 mg under the tongue every 5 (five) minutes as needed.      . simvastatin (ZOCOR) 40 MG tablet Take 1 tablet (40 mg total) by mouth at bedtime.  30 tablet  12   No current facility-administered medications for this visit.    Past Medical History  Diagnosis Date  . Coronary atherosclerosis of native coronary artery   . Type II or unspecified type diabetes mellitus without mention of complication, not stated as uncontrolled   . Other and unspecified hyperlipidemia   . Unspecified essential hypertension   . Dysmetabolic syndrome X   . Acute myocardial infarction of other anterior wall, episode of care unspecified   . Obesity, unspecified   . Tobacco use disorder   . Depression   . Thyroid disease   . Allergy   . Arthritis   . Neuromuscular disorder     Past Surgical History  Procedure Laterality Date  . Tendinitis surgery    . Knee arthroscopy    . Hand surgery    . Eye surgery      Strabismus L eye  . Toe surgery    . Cardiac catheterization    . Cardiac stenting    . Foot surgery  Achille's repair  . Tubal ligation      ROS:  As stated in the HPI and negative for all other systems.  PHYSICAL EXAM BP 170/82  Pulse 68  Ht 5\' 8"  (1.727 m)  Wt 224 lb 12.8 oz (101.969 kg)  BMI 34.19 kg/m2 GENERAL:  Well appearing HEENT:  Pupils equal round and reactive, fundi not visualized, oral mucosa unremarkable NECK:  No jugular venous distention, waveform within normal limits, carotid upstroke brisk and symmetric, no bruits, no thyromegaly LYMPHATICS:  No cervical, inguinal adenopathy LUNGS:  Clear to auscultation bilaterally BACK:  No CVA tenderness CHEST:  Unremarkable HEART:  PMI not displaced or sustained,S1 and S2 within normal limits, no S3, no S4, no clicks, no rubs, no murmurs ABD:  Flat, positive bowel sounds normal in frequency in pitch, no bruits, no rebound, no guarding, no  midline pulsatile mass, no hepatomegaly, no splenomegaly EXT:  2 plus pulses throughout, no edema, no cyanosis no clubbing SKIN:  No rashes no nodules NEURO:  Cranial nerves II through XII grossly intact, motor grossly intact throughout PSYCH:  Cognitively intact, oriented to person place and time  EKG:   Sinus rhythm, rate 68, axis within normal limits, intervals within normal limits, nonspecific inferior T-wave inversion which were not present on the most recent EKG for comparison. 04/20/2013  ASSESSMENT AND PLAN  CAD:  I would like to I agree with a stress test. I will bring the patient back for a POET (Plain Old Exercise Test). This will allow me to screen for obstructive coronary disease, risk stratify and very importantly provide a prescription for exercise.  If her knee bothers her she may need to be converted to a [employer].  CARDIOMYOPATHY:  The last ejection fraction was 55% apparently. No change in therapy is indicated.  HYPERLIPIDEMIA:  I will check a lipid level when she returns fasting.  TOBACCO ABUSE:   She is unable to quit and we did discuss this today.  HTN:  Her blood pressure is not controlled. I will increase her enalapril to 10 mg twice a day.

## 2013-04-22 ENCOUNTER — Ambulatory Visit (INDEPENDENT_AMBULATORY_CARE_PROVIDER_SITE_OTHER): Payer: BC Managed Care – PPO | Admitting: Emergency Medicine

## 2013-04-22 VITALS — BP 140/80 | HR 76 | Temp 98.0°F | Resp 16 | Ht 67.5 in | Wt 212.8 lb

## 2013-04-22 DIAGNOSIS — E119 Type 2 diabetes mellitus without complications: Secondary | ICD-10-CM

## 2013-04-22 DIAGNOSIS — IMO0001 Reserved for inherently not codable concepts without codable children: Secondary | ICD-10-CM

## 2013-04-22 DIAGNOSIS — M797 Fibromyalgia: Secondary | ICD-10-CM

## 2013-04-22 DIAGNOSIS — E039 Hypothyroidism, unspecified: Secondary | ICD-10-CM

## 2013-04-22 LAB — POCT URINALYSIS DIPSTICK
Bilirubin, UA: NEGATIVE
Glucose, UA: NEGATIVE
Leukocytes, UA: NEGATIVE
Nitrite, UA: NEGATIVE

## 2013-04-22 LAB — COMPREHENSIVE METABOLIC PANEL
Albumin: 3.8 g/dL (ref 3.5–5.2)
Alkaline Phosphatase: 114 U/L (ref 39–117)
BUN: 10 mg/dL (ref 6–23)
Creat: 0.76 mg/dL (ref 0.50–1.10)
Glucose, Bld: 166 mg/dL — ABNORMAL HIGH (ref 70–99)
Total Bilirubin: 0.5 mg/dL (ref 0.3–1.2)

## 2013-04-22 LAB — POCT CBC
HCT, POC: 42.9 % (ref 37.7–47.9)
Lymph, poc: 3.2 (ref 0.6–3.4)
MCH, POC: 29.7 pg (ref 27–31.2)
MCHC: 31.9 g/dL (ref 31.8–35.4)
MCV: 93.1 fL (ref 80–97)
MID (cbc): 0.7 (ref 0–0.9)
POC LYMPH PERCENT: 32.4 %L (ref 10–50)
Platelet Count, POC: 249 10*3/uL (ref 142–424)
RDW, POC: 13.9 %

## 2013-04-22 LAB — POCT UA - MICROSCOPIC ONLY
Casts, Ur, LPF, POC: NEGATIVE
Crystals, Ur, HPF, POC: NEGATIVE

## 2013-04-22 LAB — LIPID PANEL
HDL: 36 mg/dL — ABNORMAL LOW (ref >39)
LDL Cholesterol: 88 mg/dL (ref 0–99)
Total CHOL/HDL Ratio: 5.5 Ratio
Triglycerides: 371 mg/dL — ABNORMAL HIGH (ref <150)
VLDL: 74 mg/dL — ABNORMAL HIGH (ref 0–40)

## 2013-04-22 LAB — POCT GLYCOSYLATED HEMOGLOBIN (HGB A1C): Hemoglobin A1C: 7.1

## 2013-04-22 MED ORDER — LEVOTHYROXINE SODIUM 75 MCG PO TABS: 37.5000 ug | ORAL_TABLET | Freq: Every day | ORAL | Status: DC

## 2013-04-22 MED ORDER — METFORMIN HCL 1000 MG PO TABS: 1000.0000 mg | ORAL_TABLET | Freq: Two times a day (BID) | ORAL | Status: DC

## 2013-04-22 MED ORDER — GABAPENTIN 300 MG PO CAPS: ORAL_CAPSULE | ORAL | Status: DC

## 2013-04-22 MED ORDER — CYCLOBENZAPRINE HCL 5 MG PO TABS: 5.0000 mg | ORAL_TABLET | Freq: Three times a day (TID) | ORAL | Status: DC | PRN

## 2013-04-22 NOTE — Progress Notes (Signed)
[facility] [address], [place] [address] [phone]  Date:  04/22/2013   Name:  Anna Noble   DOB:  01/10/1956   MRN:  [id number]  PCP:  Nilda Simmer, MD    Chief Complaint: Medication Refill   History of Present Illness:  Anna Noble is a 57 y.o. very pleasant female patient who presents with the following:  NIDDM with CAD history and still smokes. Has been out of her meds for at least two weeks due to financial issues.  Not symptomatic.  Not very compliant nor motivated it seems.  No improvement with over the counter medications or other home remedies. Denies other complaint or health concern today.   Patient Active Problem List   Diagnosis Date Noted  . TOBACCO USER 02/21/2010  . DIABETES MELLITUS, TYPE II 02/19/2010  . HYPERLIPIDEMIA-MIXED 02/19/2010  . METABOLIC SYNDROME X 02/19/2010  . OBESITY 02/19/2010  . HYPERTENSION 02/19/2010  . MYOCARDIAL INFARCTION, ANTERIOR WALL 02/19/2010  . CAD, NATIVE VESSEL 02/19/2010    Past Medical History  Diagnosis Date  . Coronary atherosclerosis of native coronary artery   . Type II or unspecified type diabetes mellitus without mention of complication, not stated as uncontrolled   . Other and unspecified hyperlipidemia   . Unspecified essential hypertension   . Dysmetabolic syndrome X   . Acute myocardial infarction of other anterior wall, episode of care unspecified   . Obesity, unspecified   . Tobacco use disorder   . Depression   . Thyroid disease   . Allergy   . Arthritis   . Neuromuscular disorder     Past Surgical History  Procedure Laterality Date  . Tendinitis surgery    . Knee arthroscopy    . Hand surgery    . Eye surgery      Strabismus L eye  . Toe surgery    . Cardiac catheterization    . Cardiac stenting    . Foot surgery      Achille's repair  . Tubal ligation      History  Substance Use Topics  . Smoking status: Current Every Day Smoker -- 0.20 packs/day for 20  years    Types: Cigarettes  . Smokeless tobacco: Never Used  . Alcohol Use: 1.2 oz/week    2 Cans of beer per week     Comment: twice a week.    Family History  Problem Relation Age of Onset  . Coronary artery disease    . Down syndrome      child  . Dementia Mother   . Hypertension Mother   . Diabetes Mother   . Depression Father   . Heart disease Father 81    AMI age 62  . Hypertension Brother   . Arthritis Brother     Allergies  Allergen Reactions  . Aspirin   . Latex   . Nsaids   . Penicillins   . Sulfonamide Derivatives     Medication list has been reviewed and updated.  Current Outpatient Prescriptions on File Prior to Visit  Medication Sig Dispense Refill  . carvedilol (COREG) 12.5 MG tablet TAKE ONE TABLET BY MOUTH TWICE DAILY WITH MEALS  180 tablet  3  . citalopram (CELEXA) 40 MG tablet Take 1 tablet (40 mg total) by mouth daily.  30 tablet  5  . clopidogrel (PLAVIX) 75 MG tablet TAKE ONE TABLET BY MOUTH EVERY DAY  90 tablet  3  . enalapril (VASOTEC) 10 MG  tablet TAKE ONE TABLET BY MOUTH TWICE DAILY  60 tablet  11  . nitroGLYCERIN (NITROSTAT) 0.4 MG SL tablet Place 1 tablet (0.4 mg total) under the tongue every 5 (five) minutes as needed.  25 tablet  11  . simvastatin (ZOCOR) 40 MG tablet Take 1 tablet (40 mg total) by mouth at bedtime.  90 tablet  3   No current facility-administered medications on file prior to visit.    Review of Systems:  As per HPI, otherwise negative.    Physical Examination: Filed Vitals:   04/22/13 0928  BP: 140/80  Pulse: 76  Temp: 98 F (36.7 C)  Resp: 16   Filed Vitals:   04/22/13 0928  Height: 5' 7.5" (1.715 m)  Weight: 212 lb 12.8 oz (96.525 kg)   Body mass index is 32.82 kg/(m^2). Ideal Body Weight: Weight in (lb) to have BMI = 25: 161.7  GEN: WDWN, NAD, Non-toxic, A & O x 3 HEENT: Atraumatic, Normocephalic. Neck supple. No masses, No LAD. Ears and Nose: No external deformity. CV: RRR, No M/G/R. No JVD. No  thrill. No extra heart sounds. PULM: CTA B, no wheezes, crackles, rhonchi. No retractions. No resp. distress. No accessory muscle use. ABD: S, NT, ND, +BS. No rebound. No HSM. EXTR: No c/c/e NEURO Normal gait.  PSYCH: Normally interactive. Conversant. Not depressed or anxious appearing.  Calm demeanor.    Assessment and Plan: NIDDM obesity HBP Hyperlipidemia Smoker Encouraged to stop smoking Labs Follow up based on labs.   Signed,  Phillips Odor, MD   Results for orders placed in visit on 04/22/13  POCT CBC      Result Value Range   WBC 9.9  4.6 - 10.2 K/uL   Lymph, poc 3.2  0.6 - 3.4   POC LYMPH PERCENT 32.4  10 - 50 %L   MID (cbc) 0.7  0 - 0.9   POC MID % 6.7  0 - 12 %M   POC Granulocyte 6.0  2 - 6.9   Granulocyte percent 60.9  37 - 80 %G   RBC 4.61  4.04 - 5.48 M/uL   Hemoglobin 13.7  12.2 - 16.2 g/dL   HCT, POC 16.1  09.6 - 47.9 %   MCV 93.1  80 - 97 fL   MCH, POC 29.7  27 - 31.2 pg   MCHC 31.9  31.8 - 35.4 g/dL   RDW, POC 04.5     Platelet Count, POC 249  142 - 424 K/uL   MPV 8.0  0 - 99.8 fL  POCT UA - MICROSCOPIC ONLY      Result Value Range   WBC, Ur, HPF, POC 0-1     RBC, urine, microscopic 0-4     Bacteria, U Microscopic neg     Mucus, UA neg     Epithelial cells, urine per micros 0-5     Crystals, Ur, HPF, POC neg     Casts, Ur, LPF, POC neg     Yeast, UA neg    POCT URINALYSIS DIPSTICK      Result Value Range   Color, UA yellow     Clarity, UA clear     Glucose, UA neg     Bilirubin, UA neg     Ketones, UA neg     Spec Grav, UA >=1.030     Blood, UA moderate     pH, UA 6.0     Protein, UA neg     Urobilinogen, UA 0.2  Nitrite, UA neg     Leukocytes, UA Negative    POCT GLYCOSYLATED HEMOGLOBIN (HGB A1C)      Result Value Range   Hemoglobin A1C 7.1

## 2013-04-25 ENCOUNTER — Telehealth: Payer: Self-pay

## 2013-04-25 MED ORDER — SIMVASTATIN 40 MG PO TABS: 40.0000 mg | ORAL_TABLET | Freq: Every day | ORAL | Status: DC

## 2013-04-25 NOTE — Addendum Note (Signed)
Addended by: Carmelina Dane on: 04/25/2013 08:29 AM   Modules accepted: Orders

## 2013-04-25 NOTE — Telephone Encounter (Signed)
Notes Recorded by Cheri Kearns, CMA on 04/25/2013 at 9:17 AM Pt notified of lab finding, states she will make appt for a follow up . Notes Recorded by Phillips Odor, MD on 04/25/2013 at 8:30 AM Labs are crappy but he was off meds for two weeks at least. He should follow up in a month for recheck  Patient asking if she can have lab only or needs office visit, can this be lab only? Please advise.

## 2013-04-25 NOTE — Telephone Encounter (Signed)
Patient is wanting to speak with someone regarding if she needs a follow up appointment or if she just needs labs.   Best#: [phone]

## 2013-05-03 ENCOUNTER — Telehealth: Payer: Self-pay

## 2013-05-03 NOTE — Telephone Encounter (Signed)
Patient saw Dr. Dareen Piano recently and was told to return within 30 days so that they could do her labs again due to the results of the last ones.  She wants to know is this going to be labs only or will it require an OV?  I did not see an order for labs only.  Please call [phone]

## 2013-05-04 NOTE — Addendum Note (Signed)
Addended by: Carmelina Dane on: 05/04/2013 10:06 AM   Modules accepted: Orders

## 2013-05-04 NOTE — Telephone Encounter (Signed)
Future orders placed 

## 2013-05-04 NOTE — Telephone Encounter (Signed)
Spoke with pt advised to RTC for labs

## 2013-05-05 ENCOUNTER — Ambulatory Visit: Payer: BC Managed Care – PPO | Admitting: Family Medicine

## 2013-06-12 ENCOUNTER — Other Ambulatory Visit: Payer: Self-pay

## 2013-06-12 DIAGNOSIS — F32A Depression, unspecified: Secondary | ICD-10-CM

## 2013-06-12 DIAGNOSIS — F329 Major depressive disorder, single episode, unspecified: Secondary | ICD-10-CM

## 2013-06-12 MED ORDER — CITALOPRAM HYDROBROMIDE 40 MG PO TABS: 40.0000 mg | ORAL_TABLET | Freq: Every day | ORAL | Status: DC

## 2013-07-21 ENCOUNTER — Telehealth: Payer: Self-pay

## 2013-07-21 DIAGNOSIS — F32A Depression, unspecified: Secondary | ICD-10-CM

## 2013-07-21 DIAGNOSIS — F329 Major depressive disorder, single episode, unspecified: Secondary | ICD-10-CM

## 2013-07-21 NOTE — Telephone Encounter (Signed)
Patient needs refill on generic Celexa (Citalopram). Uses [facility] on [address]. CB# [phone]

## 2013-07-21 NOTE — Telephone Encounter (Signed)
Patient needs follow up. What is the plan? She states she can come in tomorrow.

## 2013-07-27 MED ORDER — CITALOPRAM HYDROBROMIDE 40 MG PO TABS: 40.0000 mg | ORAL_TABLET | Freq: Every day | ORAL | Status: DC

## 2013-07-27 NOTE — Addendum Note (Signed)
Addended by: Sheppard Plumber A on: 07/27/2013 11:50 AM   Modules accepted: Orders

## 2013-07-27 NOTE — Telephone Encounter (Addendum)
I received a RF req from pharm for Celexa. I'm not sure why this message didn't get routed anywhere after Amy spoke w/pt, but I called pt to plan f/up. Pt stated that she didn't think it had been that long since she has been in. When advised that the last time this medication/depression was discussed was >year, pt stated that when she was in a few months ago it was for med RFs and told the MD what meds needed to be filled and not all of them were. She wants to get in to see Dr Katrinka Blazing who is her PCP and I advised her that she is not working any Sats. In Nov, but that I would check w/appt center for appt openings. We got cut off but I called her back and LMOM for her to call appt center and sch, then have them call me so I can get her med filled until then.

## 2013-07-27 NOTE — Telephone Encounter (Signed)
Pt has appt sch for 07/31/13 and I have sent in 1 mos of medication.

## 2013-07-31 ENCOUNTER — Encounter: Payer: Self-pay | Admitting: Family Medicine

## 2013-07-31 ENCOUNTER — Ambulatory Visit (INDEPENDENT_AMBULATORY_CARE_PROVIDER_SITE_OTHER): Payer: BC Managed Care – PPO | Admitting: Family Medicine

## 2013-07-31 VITALS — BP 130/68 | HR 84 | Temp 98.2°F | Resp 16 | Ht 67.0 in | Wt 206.2 lb

## 2013-07-31 DIAGNOSIS — R1011 Right upper quadrant pain: Secondary | ICD-10-CM

## 2013-07-31 DIAGNOSIS — E782 Mixed hyperlipidemia: Secondary | ICD-10-CM

## 2013-07-31 DIAGNOSIS — E119 Type 2 diabetes mellitus without complications: Secondary | ICD-10-CM

## 2013-07-31 DIAGNOSIS — I2510 Atherosclerotic heart disease of native coronary artery without angina pectoris: Secondary | ICD-10-CM

## 2013-07-31 DIAGNOSIS — F32A Depression, unspecified: Secondary | ICD-10-CM

## 2013-07-31 DIAGNOSIS — F329 Major depressive disorder, single episode, unspecified: Secondary | ICD-10-CM

## 2013-07-31 DIAGNOSIS — E039 Hypothyroidism, unspecified: Secondary | ICD-10-CM

## 2013-07-31 DIAGNOSIS — M797 Fibromyalgia: Secondary | ICD-10-CM

## 2013-07-31 DIAGNOSIS — Z23 Encounter for immunization: Secondary | ICD-10-CM

## 2013-07-31 DIAGNOSIS — I10 Essential (primary) hypertension: Secondary | ICD-10-CM

## 2013-07-31 LAB — COMPREHENSIVE METABOLIC PANEL
ALT: <8 U/L (ref 0–35)
AST: 11 U/L (ref 0–37)
Albumin: 3.9 g/dL (ref 3.5–5.2)
BUN: 7 mg/dL (ref 6–23)
CO2: 28 mEq/L (ref 19–32)
Calcium: 9.3 mg/dL (ref 8.4–10.5)
Chloride: 106 mEq/L (ref 96–112)
Creat: 0.58 mg/dL (ref 0.50–1.10)
Potassium: 4.3 mEq/L (ref 3.5–5.3)
Total Protein: 7 g/dL (ref 6.0–8.3)

## 2013-07-31 LAB — CBC WITH DIFFERENTIAL/PLATELET
Eosinophils Absolute: 0.2 10*3/uL (ref 0.0–0.7)
Lymphs Abs: 2.8 10*3/uL (ref 0.7–4.0)
MCH: 29.1 pg (ref 26.0–34.0)
Neutro Abs: 4.5 10*3/uL (ref 1.7–7.7)
Neutrophils Relative %: 54 % (ref 43–77)
Platelets: 284 10*3/uL (ref 150–400)
RBC: 4.7 MIL/uL (ref 3.87–5.11)
WBC: 8.1 10*3/uL (ref 4.0–10.5)

## 2013-07-31 LAB — LIPID PANEL
LDL Cholesterol: 76 mg/dL (ref 0–99)
VLDL: 49 mg/dL — ABNORMAL HIGH (ref 0–40)

## 2013-07-31 LAB — HEMOGLOBIN A1C: Hgb A1c MFr Bld: 7.5 % — ABNORMAL HIGH (ref <5.7)

## 2013-07-31 LAB — CK: Total CK: 78 U/L (ref 7–177)

## 2013-07-31 MED ORDER — CYCLOBENZAPRINE HCL 5 MG PO TABS: 5.0000 mg | ORAL_TABLET | Freq: Three times a day (TID) | ORAL | Status: DC | PRN

## 2013-07-31 MED ORDER — GABAPENTIN 300 MG PO CAPS: ORAL_CAPSULE | ORAL | Status: DC

## 2013-07-31 MED ORDER — METFORMIN HCL 1000 MG PO TABS: 1000.0000 mg | ORAL_TABLET | Freq: Two times a day (BID) | ORAL | Status: DC

## 2013-07-31 MED ORDER — LEVOTHYROXINE SODIUM 75 MCG PO TABS: 37.5000 ug | ORAL_TABLET | Freq: Every day | ORAL | Status: DC

## 2013-07-31 MED ORDER — CITALOPRAM HYDROBROMIDE 40 MG PO TABS: 40.0000 mg | ORAL_TABLET | Freq: Every day | ORAL | Status: DC

## 2013-07-31 NOTE — Progress Notes (Signed)
[address]   [place], [place]  [address]   [phone]  Subjective:    Patient ID: Anna Noble, female    DOB: 01-11-1956, 57 y.o.   MRN: [id number]  HPI This 57 y.o. female presents for three month follow-up of the following:  1.  DMII:  Sugars running 102-200.  Checking once per day.  Compliance with Metformin 1000mg  bid.  Last HgbA1c of 7.1 in 03/2013; previous use of Actos.  S/p Pneumovax after AMI in 2001.  2. HTN: cardiology increased Enalapril to 10mg  bid in 03/2013.  Checking BP at home; running 130s/70s.    3. Hyperlipidemia: fasting today; had a small amount of soda.    4. Fibromyalgia:  Boyfriend had workman's compensation injury 09/2012; has been out of work since.  Son in horrible MVA 06/28/2013 with cervical compression fracture.  Boyfriend's son is living with patient and boyfriend.  Son out of work still; should be out of work for 3-4 months.  Chronic back pain and knee pain; Neurontin very helpful for fibromyalgia.  Mood is stable despite multiple stressors.    5.  CAD: scheduled for stress testing in 03/2013.  Has not received appointment for stress testing.  Followed every 12 months by cardiology; previously followed by Dr. Daleen Squibb.  Coded after AMI.    6. RUQ pain: severe episode of tightening pain  five days ago.  Intermittent episodes for past two months but mild.  +Diarrhea stools after greasy meals.  No previous colonoscopy.  No bloody or melanotic stools.  Pain usually occurs after a greasy or high fatty meal.  No indigestion, heartburn, reflux but takes Pepcid daily.  No fever/chills/sweats.  No exertional induced pain.  No diaphoresis with pain. +nausea with pain but no vomiting.     Review of Systems  Constitutional: Negative for fever, chills, diaphoresis and fatigue.  Respiratory: Negative for cough, shortness of breath, wheezing and stridor.   Cardiovascular: Negative for chest pain, palpitations and leg swelling.  Gastrointestinal: Positive for nausea, abdominal  pain, diarrhea and abdominal distention. Negative for vomiting, constipation, blood in stool, anal bleeding and rectal pain.  Musculoskeletal: Positive for arthralgias, back pain and myalgias. Negative for gait problem, joint swelling, neck pain and neck stiffness.  Neurological: Negative for dizziness, tremors, seizures, syncope, facial asymmetry, speech difficulty, weakness, light-headedness, numbness and headaches.   Past Medical History  Diagnosis Date  . Coronary atherosclerosis of native coronary artery   . Type II or unspecified type diabetes mellitus without mention of complication, not stated as uncontrolled   . Other and unspecified hyperlipidemia   . Unspecified essential hypertension   . Dysmetabolic syndrome X   . Acute myocardial infarction of other anterior wall, episode of care unspecified   . Obesity, unspecified   . Tobacco use disorder   . Depression   . Thyroid disease   . Allergy   . Arthritis   . Neuromuscular disorder    Past Surgical History  Procedure Laterality Date  . Tendinitis surgery    . Knee arthroscopy    . Hand surgery    . Eye surgery      Strabismus L eye  . Toe surgery    . Cardiac catheterization    . Cardiac stenting    . Foot surgery      Achille's repair  . Tubal ligation     Allergies  Allergen Reactions  . Aspirin   . Latex   . Nsaids   . Penicillins   .  Sulfonamide Derivatives    Current Outpatient Prescriptions on File Prior to Visit  Medication Sig Dispense Refill  . carvedilol (COREG) 12.5 MG tablet TAKE ONE TABLET BY MOUTH TWICE DAILY WITH MEALS  180 tablet  3  . clopidogrel (PLAVIX) 75 MG tablet TAKE ONE TABLET BY MOUTH EVERY DAY  90 tablet  3  . enalapril (VASOTEC) 10 MG tablet TAKE ONE TABLET BY MOUTH TWICE DAILY  60 tablet  11  . nitroGLYCERIN (NITROSTAT) 0.4 MG SL tablet Place 1 tablet (0.4 mg total) under the tongue every 5 (five) minutes as needed.  25 tablet  11  . simvastatin (ZOCOR) 40 MG tablet Take 1 tablet  (40 mg total) by mouth at bedtime.  90 tablet  3   No current facility-administered medications on file prior to visit.   History   Social History  . Marital Status: Divorced    Spouse Name: N/A    Number of Children: N/A  . Years of Education: N/A   Occupational History  . Not on file.   Social History Main Topics  . Smoking status: Current Every Day Smoker -- 0.50 packs/day for 20 years    Types: Cigarettes  . Smokeless tobacco: Never Used  . Alcohol Use: 1.2 oz/week    2 Cans of beer per week     Comment: twice a week.  . Drug Use: No  . Sexual Activity: Yes    Birth Control/ Protection: None   Other Topics Concern  . Not on file   Social History Narrative   Marital status: Divorced after 30 years of marriage in 2007.  Dating same gentleman x 5 years.   Children: 2 children, no grandchildren.   Living: with boyfriend.   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   Tobacco: 1/2 ppd x 15 years.   Alcohol: beer socially.   Drugs: none   Exercise: walking three days per week       Objective:   Physical Exam  Nursing note and vitals reviewed. Constitutional: She is oriented to person, place, and time. She appears well-developed and well-nourished. No distress.  HENT:  Head: Normocephalic and atraumatic.  Eyes: Conjunctivae are normal. Pupils are equal, round, and reactive to light.  Neck: Normal range of motion. Neck supple.  Cardiovascular: Normal rate, regular rhythm and normal heart sounds.  Exam reveals no gallop and no friction rub.   No murmur heard. Pulmonary/Chest: Effort normal and breath sounds normal. She has no wheezes. She has no rales.  Abdominal: Soft. Bowel sounds are normal. She exhibits no distension. There is tenderness in the right upper quadrant. There is no rebound and no guarding.  Neurological: She is alert and oriented to person, place, and time. No cranial nerve deficit. She exhibits normal muscle tone.  Coordination normal.  Skin: Skin is warm and dry. No rash noted. She is not diaphoretic.  Psychiatric: She has a normal mood and affect. Her behavior is normal. Judgment and thought content normal.    Unable to provide urine sample during visit.      Assessment & Plan:  Need for prophylactic vaccination and inoculation against influenza - Plan: Flu Vaccine QUAD 36+ mos IM  Type II or unspecified type diabetes mellitus without mention of complication, not stated as uncontrolled - Plan: HM Diabetes Foot Exam, CBC with Differential, Comprehensive metabolic panel, Hemoglobin A1c, metFORMIN (GLUCOPHAGE) 1000 MG tablet  Mixed hyperlipidemia - Plan: CBC with Differential, CK, Comprehensive metabolic panel, Lipid  panel  Essential hypertension, benign - Plan: CBC with Differential, CK, Comprehensive metabolic panel  RUQ abdominal pain - Plan: US Abdomen Complete, EKG 12-Lead  Coronary artery disease - Plan: EKG 12-Lead  Depression - Plan: citalopram (CELEXA) 40 MG tablet  Fibromyalgia - Plan: cyclobenzaprine (FLEXERIL) 5 MG tablet  Hypothyroid - Plan: levothyroxine (SYNTHROID, LEVOTHROID) 75 MCG tablet  1.  DMII: moderately controlled; obtain labs; refills provided; s/p flu vaccine.  2.  Dyslipidemia:  Stable; goal LDL < 70 due to CAD, DMII.  Obtain labs.   3.  HTN: improved with recent increase in Enalapril by cardiology; obtain labs. 4.  CAD: stable; EKG stable; to undergo stress testing in the upcoming three months. 5.  RUQ abdominal pain:  New.  Concerning for gallbladder pathology; obtain labs; obtain abdominal u/s.  Avoid high fatty and greasy foods.  If abdominal u/s negative, consider HIDA scan and referral to GI. 6. S/p flu vaccine. 7. Tobacco abuse: continues to smoke but contemplative at this time; using electronic cigarette intermittently.   Meds ordered this encounter  Medications  . citalopram (CELEXA) 40 MG tablet    Sig: Take 1 tablet (40 mg total) by mouth daily.     Dispense:  90 tablet    Refill:  1  . cyclobenzaprine (FLEXERIL) 5 MG tablet    Sig: Take 1 tablet (5 mg total) by mouth 3 (three) times daily as needed.    Dispense:  90 tablet    Refill:  5    Order Specific Question:  Supervising Provider    Answer:  DOOLITTLE, ROBERT P [[id number]]  . gabapentin (NEURONTIN) 300 MG capsule    Sig: Take one capsule by mouth in the morning and two at bedtime    Dispense:  270 capsule    Refill:  1    Order Specific Question:  Supervising Provider    Answer:  DOOLITTLE, ROBERT P [[id number]]  . levothyroxine (SYNTHROID, LEVOTHROID) 75 MCG tablet    Sig: Take 0.5 tablets (37.5 mcg total) by mouth daily.    Dispense:  45 tablet    Refill:  3  . metFORMIN (GLUCOPHAGE) 1000 MG tablet    Sig: Take 1 tablet (1,000 mg total) by mouth 2 (two) times daily with a meal.    Dispense:  180 tablet    Refill:  1    Order Specific Question:  Supervising Provider    Answer:  DOOLITTLE, ROBERT P [[id number]]

## 2013-08-04 ENCOUNTER — Ambulatory Visit
Admission: RE | Admit: 2013-08-04 | Discharge: 2013-08-04 | Disposition: A | Discharge status: Home / Self Care | Payer: PRIVATE HEALTH INSURANCE | Source: Ambulatory Visit | Attending: Family Medicine | Admitting: Family Medicine

## 2013-08-04 DIAGNOSIS — R1011 Right upper quadrant pain: Secondary | ICD-10-CM

## 2013-08-05 ENCOUNTER — Telehealth: Payer: Self-pay

## 2013-08-05 NOTE — Telephone Encounter (Signed)
Anna Noble called to get her imaging results that she had done on her abdomen yesterday. Anna Noble was told to call our office to get results, Anna Noble saw Dr. Katrinka Blazing. Since she will be out of town this weekend can someone review these results. Thank you!  Best: [phone]

## 2013-08-07 NOTE — Telephone Encounter (Signed)
Please call this patient. Anna Noble reveals no cause for the RUQ abdominal pain. Dr. Katrinka Blazing will review the report and determine the next step.  Her note indicates that if the Anna Noble did not identify the cause, additional studies or referral would be next.

## 2013-08-07 NOTE — Telephone Encounter (Signed)
Called patient and she feels better. To Dr Katrinka Blazing, Lorain Childes

## 2013-08-07 NOTE — Telephone Encounter (Signed)
Noted  

## 2013-08-09 ENCOUNTER — Other Ambulatory Visit: Payer: Self-pay | Admitting: Family Medicine

## 2013-08-09 ENCOUNTER — Encounter: Payer: Self-pay | Admitting: Family Medicine

## 2013-08-09 DIAGNOSIS — R1011 Right upper quadrant pain: Secondary | ICD-10-CM

## 2013-08-09 MED ORDER — SITAGLIPTIN PHOSPHATE 50 MG PO TABS: 50.0000 mg | ORAL_TABLET | Freq: Every day | ORAL | Status: DC

## 2013-08-09 NOTE — Addendum Note (Signed)
Addended by: Ethelda Chick on: 08/09/2013 08:53 AM   Modules accepted: Orders

## 2013-08-14 ENCOUNTER — Other Ambulatory Visit: Payer: Self-pay | Admitting: Radiology

## 2013-08-14 DIAGNOSIS — R109 Unspecified abdominal pain: Secondary | ICD-10-CM

## 2013-08-30 ENCOUNTER — Encounter (HOSPITAL_COMMUNITY)
Admission: RE | Admit: 2013-08-30 | Discharge: 2013-08-30 | Disposition: A | Discharge status: Home / Self Care | Payer: BC Managed Care – PPO | Source: Ambulatory Visit | Attending: Family Medicine | Admitting: Family Medicine

## 2013-08-30 DIAGNOSIS — R109 Unspecified abdominal pain: Secondary | ICD-10-CM | POA: Insufficient documentation

## 2013-08-30 MED ORDER — TECHNETIUM TC 99M MEBROFENIN IV KIT
5.0000 | PACK | Freq: Once | INTRAVENOUS | Status: AC | PRN
Start: 2013-08-30 — End: 2013-08-30
  Administered 2013-08-30: 5 via INTRAVENOUS

## 2013-09-01 ENCOUNTER — Encounter: Payer: Self-pay | Admitting: Family Medicine

## 2013-10-31 ENCOUNTER — Encounter: Payer: BC Managed Care – PPO | Admitting: Family Medicine

## 2013-11-07 ENCOUNTER — Telehealth: Payer: Self-pay

## 2013-11-07 DIAGNOSIS — E039 Hypothyroidism, unspecified: Secondary | ICD-10-CM

## 2013-11-07 DIAGNOSIS — F32A Depression, unspecified: Secondary | ICD-10-CM

## 2013-11-07 DIAGNOSIS — E119 Type 2 diabetes mellitus without complications: Secondary | ICD-10-CM

## 2013-11-07 DIAGNOSIS — M797 Fibromyalgia: Secondary | ICD-10-CM

## 2013-11-07 DIAGNOSIS — F329 Major depressive disorder, single episode, unspecified: Secondary | ICD-10-CM

## 2013-11-07 NOTE — Telephone Encounter (Signed)
SMITH - Pt had to reschedule her CPE appointment with you due to her work schedule being changed.  She will now be coming in March 30.  She wants to get refills on "all her medicine" to last her until then.  [phone]

## 2013-11-08 ENCOUNTER — Encounter: Payer: BC Managed Care – PPO | Admitting: Family Medicine

## 2013-11-09 MED ORDER — CITALOPRAM HYDROBROMIDE 40 MG PO TABS: 40.0000 mg | ORAL_TABLET | Freq: Every day | ORAL | Status: DC

## 2013-11-09 MED ORDER — GABAPENTIN 300 MG PO CAPS: ORAL_CAPSULE | ORAL | Status: DC

## 2013-11-09 MED ORDER — METFORMIN HCL 1000 MG PO TABS: 1000.0000 mg | ORAL_TABLET | Freq: Two times a day (BID) | ORAL | Status: DC

## 2013-11-09 MED ORDER — CYCLOBENZAPRINE HCL 5 MG PO TABS: 5.0000 mg | ORAL_TABLET | Freq: Three times a day (TID) | ORAL | Status: DC | PRN

## 2013-11-09 MED ORDER — LEVOTHYROXINE SODIUM 75 MCG PO TABS: 37.5000 ug | ORAL_TABLET | Freq: Every day | ORAL | Status: DC

## 2013-11-09 NOTE — Telephone Encounter (Signed)
Recommend patient starting Januvia now; I want fasting sugars to range 100-120.

## 2013-11-09 NOTE — Telephone Encounter (Signed)
Pt CB and stated she needs RFs for 2 mos on citalopram, metformin, flexeril, gabapentin and levothyroxine. I am sending these in for pt. I asked her about Januvia that Dr Katrinka BlazingSmith ordered at last OV. She stated that she never knew anything about taking that and has not been. Pt reported that her fasting BS has been running 130-140s. Dr Katrinka BlazingSmith, did you want pt on Januvia and should she begin it now or wait until her appt 12/25/13?

## 2013-11-09 NOTE — Telephone Encounter (Signed)
LMOM for pt to CB to clarify the meds she needs RFs on.

## 2013-11-10 NOTE — Telephone Encounter (Signed)
Called pharm and verified that they do have the Januvia on file for pt and will get ready for her. Called pt and LMOM w/instr's and goal fasting BSs, asked for CB w/any ?s.

## 2013-11-14 ENCOUNTER — Ambulatory Visit: Payer: BC Managed Care – PPO | Admitting: Cardiology

## 2013-12-07 ENCOUNTER — Telehealth: Payer: Self-pay

## 2013-12-07 DIAGNOSIS — E119 Type 2 diabetes mellitus without complications: Secondary | ICD-10-CM

## 2013-12-07 NOTE — Telephone Encounter (Signed)
There are several drugs in the same class as Januvia that we could change to.  Pt needs to call ins company and find out what is there preferred drug

## 2013-12-07 NOTE — Telephone Encounter (Signed)
PT STATES HER INSURANCE WON'T PAY FOR HER JANUVIA ANYMORE AND WANTED TO KNOW IF THE DR COULD SWITCH TO SOMETHING ELSE. ALSO IN NEED OF HER METFORMIN PLEASE CALL [phone]   [facility] ON [address]

## 2013-12-07 NOTE — Telephone Encounter (Signed)
Please advise 

## 2013-12-08 NOTE — Telephone Encounter (Signed)
Insurance company will cover metformin  glipiside glimepride   [phone]

## 2013-12-08 NOTE — Telephone Encounter (Signed)
Left message for patient to contact insurance company to find out what medication they will except.

## 2013-12-12 MED ORDER — METFORMIN HCL 1000 MG PO TABS: 1000.0000 mg | ORAL_TABLET | Freq: Two times a day (BID) | ORAL | Status: DC

## 2013-12-12 NOTE — Telephone Encounter (Signed)
Pt has changed insurance companies. Her ins will cover glimepiride and glipizide. Is this an option to fill instead of the [place]? Please advise

## 2013-12-12 NOTE — Telephone Encounter (Signed)
Call --- patient has a sulfa allergy; thus, Glipizide and Glimepiride are not options for her.  Can we check on Onglyza?

## 2013-12-12 NOTE — Telephone Encounter (Signed)
PT CALLED AGAIN TODAY TO REQUEST ANOTHER KIND OF THE JANUVIA SINCE HER INSURANCE WON'T COVER IT. PLEASE CALL [phone]

## 2013-12-12 NOTE — Telephone Encounter (Signed)
I have sent in RF of metformin. Dr Katrinka BlazingSmith, I have completed a PA for Januvia. When Januvia is denied, usually ins prefers the Onglyza. I will get that info w/denial if ins denies the PA for Januvia. Also, see below that pt called ins and they told her cover glipizide and glimepiride. I am sending this to you since you are pt's PCP.

## 2013-12-13 MED ORDER — SAXAGLIPTIN HCL 2.5 MG PO TABS: 2.5000 mg | ORAL_TABLET | Freq: Every day | ORAL | Status: DC

## 2013-12-13 NOTE — Telephone Encounter (Signed)
[facility].5[facility]  one tablet daily. Please advise patient.

## 2013-12-13 NOTE — Telephone Encounter (Signed)
Lm new rx at her pharmacy.

## 2013-12-13 NOTE — Telephone Encounter (Signed)
PA was denied for Januvia d/t preferred alternatives not having been tried/failed previously. Covered alternatives are Onglyza/Kombiglyze XR and Tradjenta/Jentadeuto. Dr Katrinka BlazingSmith please Rx one of these if appropriate.

## 2013-12-18 ENCOUNTER — Encounter: Payer: Self-pay | Admitting: Cardiology

## 2013-12-18 ENCOUNTER — Ambulatory Visit (INDEPENDENT_AMBULATORY_CARE_PROVIDER_SITE_OTHER): Payer: PRIVATE HEALTH INSURANCE | Admitting: Cardiology

## 2013-12-18 VITALS — BP 160/84 | HR 74 | Ht 67.5 in | Wt 209.0 lb

## 2013-12-18 DIAGNOSIS — I2109 ST elevation (STEMI) myocardial infarction involving other coronary artery of anterior wall: Secondary | ICD-10-CM

## 2013-12-18 DIAGNOSIS — I10 Essential (primary) hypertension: Secondary | ICD-10-CM

## 2013-12-18 DIAGNOSIS — I2510 Atherosclerotic heart disease of native coronary artery without angina pectoris: Secondary | ICD-10-CM

## 2013-12-18 NOTE — Patient Instructions (Signed)
The current medical regimen is effective;  continue present plan and medications.  Follow up in 1 year with Dr Hochrein.  You will receive a letter in the mail 2 months before you are due.  Please call us when you receive this letter to schedule your follow up appointment.  

## 2013-12-18 NOTE — Progress Notes (Signed)
HPI The patient presents to me. She was previously followed by Dr. Daleen Squibb. She has a history of coronary disease at catheterization 2001 demonstrating an EF of 33%. The LAD at 90% stenosis in the mid vessel. There is diffuse 50% stenosis after this. Second diagonal had mid 99% stenosis. Ramus intermediate had 30% stenosis. Right coronary artery 30% stenosis. The patient had stenting of the LAD and required a balloon pump at that time. The last stress test apparently was in 2007. Her EF was about 50%. She had apical hypokinesis and mild ischemia.  Since she was last seen she's had no chest pain. She does get some heartburn which is different than her previous angina and a very stable pattern.. She does not describe substernal chest discomfort, neck or arm discomfort. She does not describe shortness of breath, PND or orthopnea. She walks constantly at work.. She interested in stopping smoking. She has a lot of stress at work.    Allergies  Allergen Reactions  . Aspirin   . Latex   . Nsaids   . Penicillins   . Sulfonamide Derivatives     Current Outpatient Prescriptions  Medication Sig Dispense Refill  . carvedilol (COREG) 12.5 MG tablet TAKE ONE TABLET BY MOUTH TWICE DAILY WITH MEALS  180 tablet  3  . citalopram (CELEXA) 40 MG tablet Take 1 tablet (40 mg total) by mouth daily.  30 tablet  1  . clopidogrel (PLAVIX) 75 MG tablet TAKE ONE TABLET BY MOUTH EVERY DAY  90 tablet  3  . cyclobenzaprine (FLEXERIL) 5 MG tablet Take 1 tablet (5 mg total) by mouth 3 (three) times daily as needed.  90 tablet  1  . enalapril (VASOTEC) 10 MG tablet TAKE ONE TABLET BY MOUTH TWICE DAILY  60 tablet  11  . gabapentin (NEURONTIN) 300 MG capsule Take one capsule by mouth in the morning and two at bedtime  90 capsule  1  . levothyroxine (SYNTHROID, LEVOTHROID) 75 MCG tablet Take 0.5 tablets (37.5 mcg total) by mouth daily.  15 tablet  1  . metFORMIN (GLUCOPHAGE) 1000 MG tablet Take 1 tablet (1,000 mg total) by  mouth 2 (two) times daily with a meal.  60 tablet  2  . nitroGLYCERIN (NITROSTAT) 0.4 MG SL tablet Place 1 tablet (0.4 mg total) under the tongue every 5 (five) minutes as needed.  25 tablet  11  . saxagliptin HCl (ONGLYZA) 2.5 MG TABS tablet Take 1 tablet (2.5 mg total) by mouth daily.  30 tablet  5  . simvastatin (ZOCOR) 40 MG tablet Take 1 tablet (40 mg total) by mouth at bedtime.  90 tablet  3   No current facility-administered medications for this visit.    Past Medical History  Diagnosis Date  . Coronary atherosclerosis of native coronary artery   . Type II or unspecified type diabetes mellitus without mention of complication, not stated as uncontrolled   . Other and unspecified hyperlipidemia   . Unspecified essential hypertension   . Dysmetabolic syndrome X   . Acute myocardial infarction of other anterior wall, episode of care unspecified   . Obesity, unspecified   . Tobacco use disorder   . Depression   . Thyroid disease   . Allergy   . Arthritis   . Neuromuscular disorder     Past Surgical History  Procedure Laterality Date  . Tendinitis surgery    . Knee arthroscopy    . Hand surgery    . Eye surgery  Strabismus L eye  . Toe surgery    . Cardiac catheterization    . Cardiac stenting    . Foot surgery      Achille's repair  . Tubal ligation      ROS:  As stated in the HPI and negative for all other systems.  PHYSICAL EXAM BP 160/84  Pulse 74  Ht 5' 7.5" (1.715 m)  Wt 209 lb (94.802 kg)  BMI 32.23 kg/m2 GENERAL:  Well appearing HEENT:  Pupils equal round and reactive, fundi not visualized, oral mucosa unremarkable NECK:  No jugular venous distention, waveform within normal limits, carotid upstroke brisk and symmetric, no bruits, no thyromegaly LYMPHATICS:  No cervical, inguinal adenopathy LUNGS:  Clear to auscultation bilaterally BACK:  No CVA tenderness CHEST:  Unremarkable HEART:  PMI not displaced or sustained,S1 and S2 within normal limits, no  S3, no S4, no clicks, no rubs, no murmurs ABD:  Flat, positive bowel sounds normal in frequency in pitch, no bruits, no rebound, no guarding, no midline pulsatile mass, no hepatomegaly, no splenomegaly EXT:  2 plus pulses throughout, no edema, no cyanosis no clubbing SKIN:  No rashes no nodules NEURO:  Cranial nerves II through XII grossly intact, motor grossly intact throughout PSYCH:  Cognitively intact, oriented to person place and time  EKG:   Sinus rhythm, rate 68, axis within normal limits, intervals within normal limits, nonspecific inferior T-wave inversion which were not present on the most recent EKG for comparison. 12/18/2013  ASSESSMENT AND PLAN  CAD:  I had wanted to do a stress test previously but this didn't happen. The patient said she does not want a treadmill test. I don't think a nuclear test is indicated as her symptoms are so atypical and the same as her previous heartburn. She will continue with risk reduction.  CARDIOMYOPATHY:  The last ejection fraction was 55% . No change in therapy is indicated.  HYPERLIPIDEMIA:  Her last LDL was 76. HDL was 35. She will continue on the meds as listed.  TOBACCO ABUSE:   She he is starting and knee cigarettes a day which I cannot endorse but she understands that she needs to quit smoking.  HTN:  Her blood pressure is mildly elevated. However, she says this has not been typical. She will keep a blood pressure diary. She said she felt better after the last appointment I increased her enalapril.

## 2013-12-25 ENCOUNTER — Encounter: Payer: BC Managed Care – PPO | Admitting: Family Medicine

## 2014-01-10 ENCOUNTER — Other Ambulatory Visit: Payer: Self-pay

## 2014-01-10 DIAGNOSIS — M797 Fibromyalgia: Secondary | ICD-10-CM

## 2014-01-10 DIAGNOSIS — F32A Depression, unspecified: Secondary | ICD-10-CM

## 2014-01-10 DIAGNOSIS — E119 Type 2 diabetes mellitus without complications: Secondary | ICD-10-CM

## 2014-01-10 DIAGNOSIS — E039 Hypothyroidism, unspecified: Secondary | ICD-10-CM

## 2014-01-10 DIAGNOSIS — F329 Major depressive disorder, single episode, unspecified: Secondary | ICD-10-CM

## 2014-01-10 MED ORDER — LEVOTHYROXINE SODIUM 75 MCG PO TABS: 37.5000 ug | ORAL_TABLET | Freq: Every day | ORAL | Status: DC

## 2014-01-10 NOTE — Telephone Encounter (Signed)
[facility] reqs Rxs for 90 day supplies of pended meds. I can only OK 30 days since pt is due for f/up in May. She has appt sch 02/14/14. Can we send 90 days of each?

## 2014-01-11 MED ORDER — METFORMIN HCL 1000 MG PO TABS: 1000.0000 mg | ORAL_TABLET | Freq: Two times a day (BID) | ORAL | Status: DC

## 2014-01-11 MED ORDER — GABAPENTIN 300 MG PO CAPS: ORAL_CAPSULE | ORAL | Status: DC

## 2014-01-11 MED ORDER — CITALOPRAM HYDROBROMIDE 40 MG PO TABS: 40.0000 mg | ORAL_TABLET | Freq: Every day | ORAL | Status: DC

## 2014-01-11 MED ORDER — CYCLOBENZAPRINE HCL 5 MG PO TABS: 5.0000 mg | ORAL_TABLET | Freq: Three times a day (TID) | ORAL | Status: DC | PRN

## 2014-01-11 MED ORDER — SIMVASTATIN 40 MG PO TABS: 40.0000 mg | ORAL_TABLET | Freq: Every day | ORAL | Status: DC

## 2014-01-11 NOTE — Telephone Encounter (Signed)
Yes, 90 day supply approved and escribed.

## 2014-01-24 ENCOUNTER — Encounter (INDEPENDENT_AMBULATORY_CARE_PROVIDER_SITE_OTHER): Payer: BC Managed Care – PPO | Admitting: Ophthalmology

## 2014-01-26 ENCOUNTER — Telehealth: Payer: Self-pay | Admitting: Family Medicine

## 2014-01-26 DIAGNOSIS — H319 Unspecified disorder of choroid: Secondary | ICD-10-CM

## 2014-01-26 NOTE — Telephone Encounter (Signed)
LMVM requesting call back from patient advising whether or not she was still in need of referral to the retinal specialist.

## 2014-01-26 NOTE — Telephone Encounter (Signed)
Received consultation note from Dr. Francene BoyersSteven W. Bernstorf, OD, PA requesting that PCP place order for referral to retinal specialist, Dr. Alan MulderJohn Matthews.  Patient unable to see retinal specialist without referral by PCP; this is an urgent nature.  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  Agree to place referral.  Please call patient to confirm that she still needs this referral to Dr. Matthews/retinal specialist.  I have placed an order for the referral.

## 2014-01-29 NOTE — Telephone Encounter (Signed)
LMVM to CB. 

## 2014-01-30 NOTE — Telephone Encounter (Signed)
Anna Noble called, and wanted to let Dr. Katrinka BlazingSmith know that Yes, she does need that referral to the retina specialist.

## 2014-02-05 ENCOUNTER — Encounter (INDEPENDENT_AMBULATORY_CARE_PROVIDER_SITE_OTHER): Payer: PRIVATE HEALTH INSURANCE | Admitting: Ophthalmology

## 2014-02-14 ENCOUNTER — Ambulatory Visit: Payer: BC Managed Care – PPO | Admitting: Family Medicine

## 2014-03-19 ENCOUNTER — Ambulatory Visit (INDEPENDENT_AMBULATORY_CARE_PROVIDER_SITE_OTHER): Payer: Self-pay | Admitting: Family Medicine

## 2014-03-19 ENCOUNTER — Encounter: Payer: Self-pay | Admitting: Family Medicine

## 2014-03-19 VITALS — BP 171/82 | HR 78 | Temp 98.1°F | Resp 16 | Ht 67.25 in | Wt 209.2 lb

## 2014-03-19 DIAGNOSIS — M797 Fibromyalgia: Secondary | ICD-10-CM

## 2014-03-19 DIAGNOSIS — M7660 Achilles tendinitis, unspecified leg: Secondary | ICD-10-CM

## 2014-03-19 DIAGNOSIS — I10 Essential (primary) hypertension: Secondary | ICD-10-CM

## 2014-03-19 DIAGNOSIS — E782 Mixed hyperlipidemia: Secondary | ICD-10-CM

## 2014-03-19 DIAGNOSIS — F329 Major depressive disorder, single episode, unspecified: Secondary | ICD-10-CM

## 2014-03-19 DIAGNOSIS — F3289 Other specified depressive episodes: Secondary | ICD-10-CM

## 2014-03-19 DIAGNOSIS — M7662 Achilles tendinitis, left leg: Secondary | ICD-10-CM

## 2014-03-19 DIAGNOSIS — F32A Depression, unspecified: Secondary | ICD-10-CM

## 2014-03-19 DIAGNOSIS — M79672 Pain in left foot: Secondary | ICD-10-CM

## 2014-03-19 DIAGNOSIS — M79609 Pain in unspecified limb: Secondary | ICD-10-CM

## 2014-03-19 DIAGNOSIS — E119 Type 2 diabetes mellitus without complications: Secondary | ICD-10-CM

## 2014-03-19 DIAGNOSIS — E1165 Type 2 diabetes mellitus with hyperglycemia: Secondary | ICD-10-CM

## 2014-03-19 DIAGNOSIS — E039 Hypothyroidism, unspecified: Secondary | ICD-10-CM

## 2014-03-19 DIAGNOSIS — IMO0001 Reserved for inherently not codable concepts without codable children: Secondary | ICD-10-CM

## 2014-03-19 LAB — COMPLETE METABOLIC PANEL WITH GFR
ALBUMIN: 4 g/dL (ref 3.5–5.2)
ALK PHOS: 95 U/L (ref 39–117)
ALT: <8 U/L (ref 0–35)
AST: 10 U/L (ref 0–37)
BUN: 9 mg/dL (ref 6–23)
CO2: 26 mEq/L (ref 19–32)
Calcium: 9 mg/dL (ref 8.4–10.5)
Chloride: 106 mEq/L (ref 96–112)
Creat: 0.71 mg/dL (ref 0.50–1.10)
GLUCOSE: 135 mg/dL — AB (ref 70–99)
POTASSIUM: 4.6 meq/L (ref 3.5–5.3)
Sodium: 140 mEq/L (ref 135–145)
TOTAL PROTEIN: 6.8 g/dL (ref 6.0–8.3)
Total Bilirubin: 0.3 mg/dL (ref 0.2–1.2)

## 2014-03-19 LAB — LIPID PANEL
Cholesterol: 152 mg/dL (ref 0–200)
HDL: 39 mg/dL — ABNORMAL LOW (ref >39)
LDL Cholesterol: 56 mg/dL (ref 0–99)
Total CHOL/HDL Ratio: 3.9 Ratio
Triglycerides: 287 mg/dL — ABNORMAL HIGH (ref <150)
VLDL: 57 mg/dL — ABNORMAL HIGH (ref 0–40)

## 2014-03-19 LAB — POCT GLYCOSYLATED HEMOGLOBIN (HGB A1C): Hemoglobin A1C: 7.3

## 2014-03-19 MED ORDER — HYDROCHLOROTHIAZIDE 12.5 MG PO TABS: 12.5000 mg | ORAL_TABLET | Freq: Every day | ORAL | Status: DC

## 2014-03-19 MED ORDER — ENALAPRIL MALEATE 10 MG PO TABS: ORAL_TABLET | ORAL | Status: DC

## 2014-03-19 MED ORDER — METFORMIN HCL 1000 MG PO TABS: 1000.0000 mg | ORAL_TABLET | Freq: Two times a day (BID) | ORAL | Status: DC

## 2014-03-19 MED ORDER — CYCLOBENZAPRINE HCL 5 MG PO TABS: 5.0000 mg | ORAL_TABLET | Freq: Three times a day (TID) | ORAL | Status: DC | PRN

## 2014-03-19 MED ORDER — SIMVASTATIN 40 MG PO TABS: 40.0000 mg | ORAL_TABLET | Freq: Every day | ORAL | Status: DC

## 2014-03-19 MED ORDER — CARVEDILOL 12.5 MG PO TABS: ORAL_TABLET | ORAL | Status: DC

## 2014-03-19 MED ORDER — LEVOTHYROXINE SODIUM 75 MCG PO TABS: 37.5000 ug | ORAL_TABLET | Freq: Every day | ORAL | Status: DC

## 2014-03-19 MED ORDER — GABAPENTIN 300 MG PO CAPS: ORAL_CAPSULE | ORAL | Status: DC

## 2014-03-19 MED ORDER — CITALOPRAM HYDROBROMIDE 40 MG PO TABS: 40.0000 mg | ORAL_TABLET | Freq: Every day | ORAL | Status: DC

## 2014-03-19 NOTE — Progress Notes (Signed)
Subjective:    Patient ID: Anna Noble, female    DOB: 1956-02-04, 58 y.o.   MRN: [id number]  03/19/2014  Follow-up, Hyperlipidemia, Hypertension and Diabetes   HPI This 58 y.o. female presents for six month follow-up for the following:  Last visit A/P:   1. DMII: moderately controlled; obtain labs; refills provided; s/p flu vaccine.  2. Dyslipidemia: Stable; goal LDL < 70 due to CAD, DMII. Obtain labs.  3. HTN: improved with recent increase in Enalapril by cardiology; obtain labs.  4. CAD: stable; EKG stable; to undergo stress testing in the upcoming three months.  5. RUQ abdominal pain: New. Concerning for gallbladder pathology; obtain labs; obtain abdominal u/s. Avoid high fatty and greasy foods. If abdominal u/s negative, consider HIDA scan and referral to GI.  6. S/p flu vaccine.  7. Tobacco abuse: continues to smoke but contemplative at this time; using electronic cigarette intermittently.   1.  DMII:  Six month follow-up; sugars running 110-160. Management changes made at last visit included adding Onglyza due to HgbA1c of 7.4.  Not taking Onglyza due to expense.  No insurance currently but should be getting in upcoming weeks.  Taking Metformin twice daily.  Nocturia x 0-1.   No numbness or tingling or burning in feet.  Compliance with Metformin; good tolerance to medication; good symtpom control.  2.  HTN: no changes to management made at last visit.  Checking BP at home sometimes; running high; 145-150/75-80.  At cardiology visit in 11/2013, systolic reading of 160.    3. Heel pain L: history of surgery on Achille's tendon in 2004; now having recurrent pain in the same area.  Ortho.  Wears crocks or flip fops or clogs or mules.   Tennis shoes causes worsening pain.    4.  Teeth extraction: having all teeth extracted in two weeks; needs note saying that can HOLD Plavix for one week.    5.  RUQ pain: no recurrence since last visit; had two severe attacks without  recurrent pain.  S/p abdominal u/s and HIDA scan that were negative.  6. Hyperlipidemia:  No changes to management made at last visit.  Patient reports good compliance with medication, good tolerance to medication, and good symptom control.   Denies HA/dizziness/focal weakness/paresthesias.  Denies CP/palp/SOB/leg swelling.  7. Fibromyalgia: stable; has really good days and then really bad days.  Major stressors at work; required to work 6 days per week.  Compliance with medications.  Needs refills.  Job is mostly a standing job.     Review of Systems  Constitutional: Negative for fever, chills, diaphoresis and fatigue.  HENT: Negative for ear pain, postnasal drip, rhinorrhea, sinus pressure, sore throat and trouble swallowing.   Eyes: Negative for visual disturbance.  Respiratory: Negative for cough and shortness of breath.   Cardiovascular: Negative for chest pain, palpitations and leg swelling.  Gastrointestinal: Negative for nausea, vomiting, abdominal pain, diarrhea and constipation.  Endocrine: Negative for cold intolerance, heat intolerance, polydipsia, polyphagia and polyuria.  Musculoskeletal: Positive for arthralgias, back pain and myalgias. Negative for joint swelling.  Neurological: Negative for dizziness, tremors, seizures, syncope, facial asymmetry, speech difficulty, weakness, light-headedness, numbness and headaches.  Psychiatric/Behavioral: Negative for dysphoric mood. The patient is not nervous/anxious.     Past Medical History  Diagnosis Date  . Coronary atherosclerosis of native coronary artery   . Type II or unspecified type diabetes mellitus without mention of complication, not stated as uncontrolled   . Other and unspecified hyperlipidemia   .  Unspecified essential hypertension   . Dysmetabolic syndrome X   . Acute myocardial infarction of other anterior wall, episode of care unspecified   . Obesity, unspecified   . Tobacco use disorder   . Depression   .  Thyroid disease   . Allergy   . Arthritis   . Neuromuscular disorder    Past Surgical History  Procedure Laterality Date  . Tendinitis surgery    . Knee arthroscopy    . Hand surgery    . Eye surgery      Strabismus L eye  . Toe surgery    . Cardiac catheterization    . Cardiac stenting    . Foot surgery      Achille's repair  . Tubal ligation      Allergies  Allergen Reactions  . Aspirin   . Latex   . Nsaids   . Penicillins   . Sulfonamide Derivatives    Current Outpatient Prescriptions  Medication Sig Dispense Refill  . carvedilol (COREG) 12.5 MG tablet TAKE ONE TABLET BY MOUTH TWICE DAILY WITH MEALS  60 tablet  5  . citalopram (CELEXA) 40 MG tablet Take 1 tablet (40 mg total) by mouth daily.  30 tablet  5  . clopidogrel (PLAVIX) 75 MG tablet TAKE ONE TABLET BY MOUTH EVERY DAY  90 tablet  3  . cyclobenzaprine (FLEXERIL) 5 MG tablet Take 1 tablet (5 mg total) by mouth 3 (three) times daily as needed.  90 tablet  5  . enalapril (VASOTEC) 10 MG tablet TAKE ONE TABLET BY MOUTH TWICE DAILY  60 tablet  11  . gabapentin (NEURONTIN) 300 MG capsule Take one capsule by mouth in the morning and two at bedtime  90 capsule  5  . levothyroxine (SYNTHROID, LEVOTHROID) 75 MCG tablet Take 0.5 tablets (37.5 mcg total) by mouth daily.  30 tablet  5  . metFORMIN (GLUCOPHAGE) 1000 MG tablet Take 1 tablet (1,000 mg total) by mouth 2 (two) times daily with a meal.  60 tablet  5  . nitroGLYCERIN (NITROSTAT) 0.4 MG SL tablet Place 1 tablet (0.4 mg total) under the tongue every 5 (five) minutes as needed.  25 tablet  11  . simvastatin (ZOCOR) 40 MG tablet Take 1 tablet (40 mg total) by mouth at bedtime.  30 tablet  5  . hydrochlorothiazide (HYDRODIURIL) 12.5 MG tablet Take 1 tablet (12.5 mg total) by mouth daily.  30 tablet  5  . saxagliptin HCl (ONGLYZA) 2.5 MG TABS tablet Take 1 tablet (2.5 mg total) by mouth daily.  30 tablet  5   No current facility-administered medications for this visit.    History   Social History  . Marital Status: Divorced    Spouse Name: N/A    Number of Children: 2  . Years of Education: N/A   Occupational History  . 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 Smoking status: Former Smoker -- 0.50 packs/day for 20 years    Types: Cigarettes    Quit date: 12/18/2013  . Smokeless tobacco: Never Used  . Alcohol Use: 1.2 oz/week    2 Cans of beer per week     Comment: twice a week.  . Drug Use: No  . Sexual Activity: Yes    Birth Control/ Protection: None   Other Topics Concern  . Not on file   Social History Narrative   Marital status: Divorced after 30 years of marriage  in 2007.  Dating same gentleman x 5 years.   Children: 2 children, no grandchildren.   Living: with boyfriend.   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   Tobacco: 1/2 ppd x 15 years.   Alcohol: beer socially.   Drugs: none   Exercise: walking three days per week        Objective:    BP 171/82  Pulse 78  Temp(Src) 98.1 F (36.7 C) (Oral)  Resp 16  Ht 5' 7.25" (1.708 m)  Wt 209 lb 3.2 oz (94.892 kg)  BMI 32.53 kg/m2  SpO2 96% Physical Exam  Constitutional: She is oriented to person, place, and time. She appears well-developed and well-nourished. No distress.  HENT:  Head: Normocephalic and atraumatic.  Right Ear: External ear normal.  Left Ear: External ear normal.  Nose: Nose normal.  Mouth/Throat: Oropharynx is clear and moist.  Eyes: Conjunctivae and EOM are normal. Pupils are equal, round, and reactive to light.  Neck: Normal range of motion. Neck supple. Carotid bruit is not present. No thyromegaly present.  Cardiovascular: Normal rate, regular rhythm, normal heart sounds and intact distal pulses.  Exam reveals no gallop and no friction rub.   No murmur heard. Pulmonary/Chest: Effort normal and breath sounds normal. She has no wheezes. She has no rales.  Abdominal: Soft. Bowel sounds are  normal. She exhibits no distension and no mass. There is no tenderness. There is no rebound and no guarding.  Musculoskeletal:       Left ankle: She exhibits normal range of motion, no swelling and no ecchymosis. No tenderness. No lateral malleolus, no medial malleolus, no head of 5th metatarsal and no proximal fibula tenderness found. Achilles tendon exhibits pain. Achilles tendon exhibits no defect and normal Thompson's test results.       Left foot: Normal. She exhibits normal range of motion and no tenderness.  Lymphadenopathy:    She has no cervical adenopathy.  Neurological: She is alert and oriented to person, place, and time. No cranial nerve deficit.  Skin: Skin is warm and dry. No rash noted. She is not diaphoretic. No erythema. No pallor.  Psychiatric: She has a normal mood and affect. Her behavior is normal.   Results for orders placed in visit on 03/19/14  COMPLETE METABOLIC PANEL WITH GFR      Result Value Ref Range   Sodium 140  135 - 145 mEq/L   Potassium 4.6  3.5 - 5.3 mEq/L   Chloride 106  96 - 112 mEq/L   CO2 26  19 - 32 mEq/L   Glucose, Bld 135 (*) 70 - 99 mg/dL   BUN 9  6 - 23 mg/dL   Creat 1.61  0.96 - 0.45 mg/dL   Total Bilirubin 0.3  0.2 - 1.2 mg/dL   Alkaline Phosphatase 95  39 - 117 U/L   AST 10  0 - 37 U/L   ALT <8  0 - 35 U/L   Total Protein 6.8  6.0 - 8.3 g/dL   Albumin 4.0  3.5 - 5.2 g/dL   Calcium 9.0  8.4 - 40.9 mg/dL   GFR, Est African American >89     GFR, Est Non African American >89    LIPID PANEL      Result Value Ref Range   Cholesterol 152  0 - 200 mg/dL   Triglycerides 811 (*) <150 mg/dL   HDL 39 (*) >91 mg/dL   Total CHOL/HDL Ratio 3.9  VLDL 57 (*) 0 - 40 mg/dL   LDL Cholesterol 56  0 - 99 mg/dL  POCT GLYCOSYLATED HEMOGLOBIN (HGB A1C)      Result Value Ref Range   Hemoglobin A1C 7.3         Assessment & Plan:  Mixed hyperlipidemia - Plan: COMPLETE METABOLIC PANEL WITH GFR, Lipid panel, CANCELED: CBC with Differential  Type II  or unspecified type diabetes mellitus without mention of complication, uncontrolled - Plan: POCT glycosylated hemoglobin (Hb A1C), COMPLETE METABOLIC PANEL WITH GFR, Lipid panel, CANCELED: CBC with Differential  Essential hypertension, benign - Plan: POCT glycosylated hemoglobin (Hb A1C), COMPLETE METABOLIC PANEL WITH GFR, Lipid panel, CANCELED: CBC with Differential  Type II or unspecified type diabetes mellitus without mention of complication, not stated as uncontrolled - Plan: metFORMIN (GLUCOPHAGE) 1000 MG tablet  Hypothyroidism, unspecified hypothyroidism type - Plan: levothyroxine (SYNTHROID, LEVOTHROID) 75 MCG tablet  Fibromyalgia - Plan: cyclobenzaprine (FLEXERIL) 5 MG tablet  Depression - Plan: citalopram (CELEXA) 40 MG tablet  Heel pain, left  Tendonitis, Achilles, left  1. DMII: controlled moderately; cannot afford Onglyza.  Continue Metformin.  Obtain labs.  Refill provided. 2.  HTN: uncontrolled; add HCTZ 12.5mg  one daily.  Obtain labs; refills provided. 3.  Hyperlipidemia: controlled; obtain labs; refills provided. 4.  Hypothyroidism: stable; refills provided.   5.  Fibromyalgia:  Stable; refills provided.   6.  L heel pain/achille's tendonitis:  New.  Recommend icing bid for 15-20 minutes, rest, supportive shoes, avoid prolonged walking or standing.  Will avoid NSAIDs due to CAD, elevated BP. 7. CAD:  Stable; s/p cardiology consultation in past 3 months.    Meds ordered this encounter  Medications  . hydrochlorothiazide (HYDRODIURIL) 12.5 MG tablet    Sig: Take 1 tablet (12.5 mg total) by mouth daily.    Dispense:  30 tablet    Refill:  5  . simvastatin (ZOCOR) 40 MG tablet    Sig: Take 1 tablet (40 mg total) by mouth at bedtime.    Dispense:  30 tablet    Refill:  5  . metFORMIN (GLUCOPHAGE) 1000 MG tablet    Sig: Take 1 tablet (1,000 mg total) by mouth 2 (two) times daily with a meal.    Dispense:  60 tablet    Refill:  5    Order Specific Question:  Supervising  Provider    Answer:  DOOLITTLE, ROBERT P [[id number]]  . levothyroxine (SYNTHROID, LEVOTHROID) 75 MCG tablet    Sig: Take 0.5 tablets (37.5 mcg total) by mouth daily.    Dispense:  30 tablet    Refill:  5  . gabapentin (NEURONTIN) 300 MG capsule    Sig: Take one capsule by mouth in the morning and two at bedtime    Dispense:  90 capsule    Refill:  5    Order Specific Question:  Supervising Provider    Answer:  DOOLITTLE, ROBERT P [[id number]]  . enalapril (VASOTEC) 10 MG tablet    Sig: TAKE ONE TABLET BY MOUTH TWICE DAILY    Dispense:  60 tablet    Refill:  11  . cyclobenzaprine (FLEXERIL) 5 MG tablet    Sig: Take 1 tablet (5 mg total) by mouth 3 (three) times daily as needed.    Dispense:  90 tablet    Refill:  5    Order Specific Question:  Supervising Provider    Answer:  DOOLITTLE, ROBERT P [[id number]]  . citalopram (CELEXA) 40 MG tablet    Sig: Take  1 tablet (40 mg total) by mouth daily.    Dispense:  30 tablet    Refill:  5  . carvedilol (COREG) 12.5 MG tablet    Sig: TAKE ONE TABLET BY MOUTH TWICE DAILY WITH MEALS    Dispense:  60 tablet    Refill:  5    Return in about 3 months (around 06/19/2014) for complete physical examiniation.   Nilda Simmer, M.D.  [facility] [address] [place], [place]  [address] [phone] phone [phone] fax

## 2014-03-20 ENCOUNTER — Encounter: Payer: Self-pay | Admitting: Family Medicine

## 2014-03-20 DIAGNOSIS — F32A Depression, unspecified: Secondary | ICD-10-CM | POA: Insufficient documentation

## 2014-03-20 DIAGNOSIS — M797 Fibromyalgia: Secondary | ICD-10-CM | POA: Insufficient documentation

## 2014-03-20 DIAGNOSIS — E039 Hypothyroidism, unspecified: Secondary | ICD-10-CM | POA: Insufficient documentation

## 2014-03-20 DIAGNOSIS — F329 Major depressive disorder, single episode, unspecified: Secondary | ICD-10-CM | POA: Insufficient documentation

## 2014-05-02 ENCOUNTER — Telehealth: Payer: Self-pay

## 2014-05-02 NOTE — Telephone Encounter (Signed)
Clld pt - advsd to sched follow up diabetes mgmt appt at the end of August or beginning of September. Pt stated she would.

## 2014-05-16 ENCOUNTER — Other Ambulatory Visit: Payer: Self-pay | Admitting: Cardiology

## 2014-06-11 ENCOUNTER — Encounter: Payer: Self-pay | Admitting: Family Medicine

## 2014-06-20 ENCOUNTER — Encounter: Payer: Self-pay | Admitting: Family Medicine

## 2014-06-20 ENCOUNTER — Ambulatory Visit (INDEPENDENT_AMBULATORY_CARE_PROVIDER_SITE_OTHER): Payer: Self-pay | Admitting: Family Medicine

## 2014-06-20 VITALS — BP 132/70 | HR 83 | Temp 98.6°F | Resp 16 | Ht 67.0 in | Wt 202.0 lb

## 2014-06-20 DIAGNOSIS — F329 Major depressive disorder, single episode, unspecified: Secondary | ICD-10-CM

## 2014-06-20 DIAGNOSIS — IMO0001 Reserved for inherently not codable concepts without codable children: Secondary | ICD-10-CM

## 2014-06-20 DIAGNOSIS — M797 Fibromyalgia: Secondary | ICD-10-CM

## 2014-06-20 DIAGNOSIS — E1165 Type 2 diabetes mellitus with hyperglycemia: Principal | ICD-10-CM

## 2014-06-20 DIAGNOSIS — E039 Hypothyroidism, unspecified: Secondary | ICD-10-CM

## 2014-06-20 DIAGNOSIS — I10 Essential (primary) hypertension: Secondary | ICD-10-CM

## 2014-06-20 DIAGNOSIS — F3289 Other specified depressive episodes: Secondary | ICD-10-CM

## 2014-06-20 DIAGNOSIS — F32A Depression, unspecified: Secondary | ICD-10-CM

## 2014-06-20 DIAGNOSIS — E119 Type 2 diabetes mellitus without complications: Secondary | ICD-10-CM

## 2014-06-20 LAB — POCT URINALYSIS DIPSTICK
BILIRUBIN UA: NEGATIVE
GLUCOSE UA: NEGATIVE
KETONES UA: NEGATIVE
LEUKOCYTES UA: NEGATIVE
Nitrite, UA: NEGATIVE
PROTEIN UA: NEGATIVE
Urobilinogen, UA: 0.2
pH, UA: 6

## 2014-06-20 LAB — POCT GLYCOSYLATED HEMOGLOBIN (HGB A1C): HEMOGLOBIN A1C: 7

## 2014-06-20 MED ORDER — METFORMIN HCL 1000 MG PO TABS: 1000.0000 mg | ORAL_TABLET | Freq: Two times a day (BID) | ORAL | Status: DC

## 2014-06-20 MED ORDER — CYCLOBENZAPRINE HCL 5 MG PO TABS: 5.0000 mg | ORAL_TABLET | Freq: Three times a day (TID) | ORAL | Status: DC | PRN

## 2014-06-20 MED ORDER — GABAPENTIN 300 MG PO CAPS: ORAL_CAPSULE | ORAL | Status: DC

## 2014-06-20 MED ORDER — LEVOTHYROXINE SODIUM 75 MCG PO TABS: 37.5000 ug | ORAL_TABLET | Freq: Every day | ORAL | Status: DC

## 2014-06-20 MED ORDER — CITALOPRAM HYDROBROMIDE 40 MG PO TABS: 40.0000 mg | ORAL_TABLET | Freq: Every day | ORAL | Status: DC

## 2014-06-20 MED ORDER — HYDROCODONE-ACETAMINOPHEN 5-325 MG PO TABS: 1.0000 | ORAL_TABLET | Freq: Two times a day (BID) | ORAL | Status: DC | PRN

## 2014-06-20 MED ORDER — SIMVASTATIN 40 MG PO TABS: 40.0000 mg | ORAL_TABLET | Freq: Every day | ORAL | Status: DC

## 2014-06-20 MED ORDER — HYDROCHLOROTHIAZIDE 12.5 MG PO TABS: 12.5000 mg | ORAL_TABLET | Freq: Every day | ORAL | Status: DC

## 2014-06-20 NOTE — Progress Notes (Signed)
Subjective:    Patient ID: Anna Noble, female    DOB: Jan 19, 1956, 58 y.o.   MRN: [id number]  06/20/2014  Follow-up, Diabetes, Hypertension and Hyperlipidemia   HPI This 58 y.o. female presents for three month follow-up:  1.  Teeth extraction: had all teeth extracted April 05, 2014.  Has dentures.  Takes exactly one hour.  Pain was close second to childbirth.  Going back tomorrow to have remnants in lower gums.    2.  DMII: presenting for HgbA1c.  Sugars running 101-130 fasting.  Patient reports good compliance with medication, good tolerance to medication, and good symptom control.    3. Fibromyalgia: hurting all over; took hydrocodone of boyfriend's and slept really well.  Taking Celexa, Flexeril, Neurontin.  Having worsening pain all over.  4.  HTN:  No changes to management made at last visit.  Compliance Coreg, Enalapril, HCTZ daily.  Needs refills.  Patient reports good compliance with medication, good tolerance to medication, and good symptom control.    5.  Hyperlipidemia:  Taking Simvastatin daily.  Patient reports good compliance with medication, good tolerance to medication, and good symptom control.    6.  CAD: has not taken NTG since last visit.  Evaluated by cardiology last 11/2013; followed annually.    Review of Systems  Constitutional: Negative for fever, chills, diaphoresis and fatigue.  Eyes: Negative for visual disturbance.  Respiratory: Negative for cough and shortness of breath.   Cardiovascular: Negative for chest pain, palpitations and leg swelling.  Gastrointestinal: Negative for nausea, vomiting, abdominal pain, diarrhea and constipation.  Endocrine: Negative for cold intolerance, heat intolerance, polydipsia, polyphagia and polyuria.  Musculoskeletal: Positive for arthralgias, back pain, myalgias, neck pain and neck stiffness.  Skin: Negative for color change, pallor, rash and wound.  Neurological: Negative for dizziness, tremors, seizures, syncope, facial  asymmetry, speech difficulty, weakness, light-headedness, numbness and headaches.  Psychiatric/Behavioral: Negative for sleep disturbance and dysphoric mood. The patient is not nervous/anxious.     Past Medical History  Diagnosis Date  . Coronary atherosclerosis of native coronary artery   . Type II or unspecified type diabetes mellitus without mention of complication, not stated as uncontrolled   . Other and unspecified hyperlipidemia   . Unspecified essential hypertension   . Dysmetabolic syndrome X   . Acute myocardial infarction of other anterior wall, episode of care unspecified   . Obesity, unspecified   . Tobacco use disorder   . Depression   . Thyroid disease   . Allergy   . Arthritis   . Neuromuscular disorder    Past Surgical History  Procedure Laterality Date  . Tendinitis surgery    . Knee arthroscopy    . Hand surgery    . Eye surgery      Strabismus L eye  . Toe surgery    . Cardiac catheterization    . Cardiac stenting    . Foot surgery      Achille's repair  . Tubal ligation     Allergies  Allergen Reactions  . Aspirin   . Latex   . Nsaids   . Penicillins   . Sulfonamide Derivatives    Current Outpatient Prescriptions  Medication Sig Dispense Refill  . carvedilol (COREG) 12.5 MG tablet TAKE ONE TABLET BY MOUTH TWICE DAILY WITH MEALS  60 tablet  5  . citalopram (CELEXA) 40 MG tablet Take 1 tablet (40 mg total) by mouth daily.  30 tablet  5  . clopidogrel (PLAVIX) 75 MG tablet  TAKE ONE TABLET BY MOUTH ONCE DAILY  90 tablet  1  . cyclobenzaprine (FLEXERIL) 5 MG tablet Take 1 tablet (5 mg total) by mouth 3 (three) times daily as needed.  90 tablet  5  . enalapril (VASOTEC) 10 MG tablet TAKE ONE TABLET BY MOUTH TWICE DAILY  60 tablet  11  . gabapentin (NEURONTIN) 300 MG capsule Take one capsule by mouth in the morning, one by mouth in the afternoon, and two at bedtime  120 capsule  5  . hydrochlorothiazide (HYDRODIURIL) 12.5 MG tablet Take 1 tablet (12.5  mg total) by mouth daily.  30 tablet  5  . levothyroxine (SYNTHROID, LEVOTHROID) 75 MCG tablet Take 0.5 tablets (37.5 mcg total) by mouth daily.  30 tablet  5  . metFORMIN (GLUCOPHAGE) 1000 MG tablet Take 1 tablet (1,000 mg total) by mouth 2 (two) times daily with a meal.  60 tablet  5  . nitroGLYCERIN (NITROSTAT) 0.4 MG SL tablet Place 1 tablet (0.4 mg total) under the tongue every 5 (five) minutes as needed.  25 tablet  11  . simvastatin (ZOCOR) 40 MG tablet Take 1 tablet (40 mg total) by mouth at bedtime.  30 tablet  5  . HYDROcodone-acetaminophen (NORCO/VICODIN) 5-325 MG per tablet Take 1 tablet by mouth 2 (two) times daily as needed for moderate pain.  30 tablet  0   No current facility-administered medications for this visit.       Objective:    BP 132/70  Pulse 83  Temp(Src) 98.6 F (37 C) (Oral)  Resp 16  Ht  (1.702 m)  Wt 202 lb (91.627 kg)  BMI 31.63 kg/m2  SpO2 98% Physical Exam  Constitutional: She is oriented to person, place, and time. She appears well-developed and well-nourished. No distress.  HENT:  Head: Normocephalic and atraumatic.  Right Ear: External ear normal.  Left Ear: External ear normal.  Nose: Nose normal.  Mouth/Throat: Oropharynx is clear and moist.  Eyes: Conjunctivae and EOM are normal. Pupils are equal, round, and reactive to light.  Neck: Normal range of motion. Neck supple. Carotid bruit is not present. No thyromegaly present.  Cardiovascular: Normal rate, regular rhythm, normal heart sounds and intact distal pulses.  Exam reveals no gallop and no friction rub.   No murmur heard. Pulmonary/Chest: Effort normal and breath sounds normal. She has no wheezes. She has no rales.  Abdominal: Soft. Bowel sounds are normal. She exhibits no distension and no mass. There is no tenderness. There is no rebound and no guarding.  Musculoskeletal:       Right shoulder: She exhibits pain.       Left shoulder: She exhibits pain.       Cervical back: She  exhibits pain. She exhibits normal range of motion, no tenderness and no bony tenderness.       Lumbar back: She exhibits pain. She exhibits normal range of motion and no tenderness.  Lymphadenopathy:    She has no cervical adenopathy.  Neurological: She is alert and oriented to person, place, and time. No cranial nerve deficit.  Skin: Skin is warm and dry. No rash noted. She is not diaphoretic. No erythema. No pallor.  Psychiatric: She has a normal mood and affect. Her behavior is normal.   Results for orders placed in visit on 06/20/14  POCT URINALYSIS DIPSTICK      Result Value Ref Range   Color, UA yellow     Clarity, UA clear     Glucose,  UA neg     Bilirubin, UA neg     Ketones, UA neg     Spec Grav, UA <=1.005     Blood, UA small     pH, UA 6.0     Protein, UA neg     Urobilinogen, UA 0.2     Nitrite, UA neg     Leukocytes, UA Negative    POCT GLYCOSYLATED HEMOGLOBIN (HGB A1C)      Result Value Ref Range   Hemoglobin A1C 7.0         Assessment & Plan:   1. Type II or unspecified type diabetes mellitus without mention of complication, uncontrolled   2. Depression   3. Fibromyalgia   4. Hypothyroidism, unspecified hypothyroidism type   5. Type II or unspecified type diabetes mellitus without mention of complication, not stated as uncontrolled   6. Essential hypertension, benign     1. DMII: controlled; obtain labs; refills provided; follow-up in six months due to lack of insurance. 2.  Fibromyalgia: worsening; increase Gabapentin to tid dosing; rx for Hydrocodone small amount provided for sparing use.   3.  Hypothyroidism: stable; due for TSH; will obtain next visit; refill provided. 4.  HTN: controlled; obtain labs; refills provided; follow-up six months. 5.  Hyperlipidemia: controlled; refills provided; will be due for FLP at next visit.    Meds ordered this encounter  Medications  . citalopram (CELEXA) 40 MG tablet    Sig: Take 1 tablet (40 mg total) by mouth  daily.    Dispense:  30 tablet    Refill:  5  . cyclobenzaprine (FLEXERIL) 5 MG tablet    Sig: Take 1 tablet (5 mg total) by mouth 3 (three) times daily as needed.    Dispense:  90 tablet    Refill:  5  . gabapentin (NEURONTIN) 300 MG capsule    Sig: Take one capsule by mouth in the morning, one by mouth in the afternoon, and two at bedtime    Dispense:  120 capsule    Refill:  5  . hydrochlorothiazide (HYDRODIURIL) 12.5 MG tablet    Sig: Take 1 tablet (12.5 mg total) by mouth daily.    Dispense:  30 tablet    Refill:  5  . levothyroxine (SYNTHROID, LEVOTHROID) 75 MCG tablet    Sig: Take 0.5 tablets (37.5 mcg total) by mouth daily.    Dispense:  30 tablet    Refill:  5  . metFORMIN (GLUCOPHAGE) 1000 MG tablet    Sig: Take 1 tablet (1,000 mg total) by mouth 2 (two) times daily with a meal.    Dispense:  60 tablet    Refill:  5  . simvastatin (ZOCOR) 40 MG tablet    Sig: Take 1 tablet (40 mg total) by mouth at bedtime.    Dispense:  30 tablet    Refill:  5  . HYDROcodone-acetaminophen (NORCO/VICODIN) 5-325 MG per tablet    Sig: Take 1 tablet by mouth 2 (two) times daily as needed for moderate pain.    Dispense:  30 tablet    Refill:  0    Return in about 6 months (around 12/19/2014) for recheck.    Nilda Simmer, M.D.  [facility] [address] [place], [place]  [address] [phone] phone [phone] fax

## 2014-06-26 ENCOUNTER — Telehealth: Payer: Self-pay | Admitting: Cardiology

## 2014-06-26 NOTE — Telephone Encounter (Signed)
Pt says she can no longer get the generic for Coreg at [facility].She says that is where she wants to get her medicine.She would like for you to call something else in. Please call ut to [phone]. She need this call in asap,she is taking her last one today.

## 2014-06-27 MED ORDER — CARVEDILOL 6.25 MG PO TABS: 12.5000 mg | ORAL_TABLET | Freq: Two times a day (BID) | ORAL | Status: DC

## 2014-06-27 NOTE — Telephone Encounter (Signed)
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  states she could not get generic anymore from pharmacy- not available  RN WILL HAVE TO CALL AND FIND OUT WHAT IS GOING ON AND CONTACT PATIENT BACK.  SPOKE TO PHARMACY --12.5 MG CARVEDILOL IS ON BACK ORDER FROM MANUFACTURING OKAY TO SWITCH TO 6.25 MG-- 2 TABLETS  TWICE  A DAY   (TOTAL 4 TABLETS A DAY)per BRYAN HAGER PA  PATIENT AWARE.

## 2014-06-27 NOTE — Telephone Encounter (Signed)
This is am Hochrein and we can not change any meds

## 2014-06-27 NOTE — Telephone Encounter (Signed)
Read the previous note,pt wanted to get another medicine in the place of her Coreg. I sent it to [facility],it should have been here.c

## 2014-12-13 ENCOUNTER — Other Ambulatory Visit: Payer: Self-pay

## 2014-12-13 MED ORDER — CLOPIDOGREL BISULFATE 75 MG PO TABS: 75.0000 mg | ORAL_TABLET | Freq: Every day | ORAL | Status: DC

## 2014-12-19 ENCOUNTER — Encounter: Payer: Self-pay | Admitting: Family Medicine

## 2014-12-19 ENCOUNTER — Ambulatory Visit (INDEPENDENT_AMBULATORY_CARE_PROVIDER_SITE_OTHER): Payer: 59

## 2014-12-19 ENCOUNTER — Ambulatory Visit (INDEPENDENT_AMBULATORY_CARE_PROVIDER_SITE_OTHER): Payer: 59 | Admitting: Family Medicine

## 2014-12-19 VITALS — BP 146/80 | HR 77 | Temp 98.9°F | Resp 16 | Ht 68.0 in | Wt 203.6 lb

## 2014-12-19 DIAGNOSIS — Z23 Encounter for immunization: Secondary | ICD-10-CM

## 2014-12-19 DIAGNOSIS — F329 Major depressive disorder, single episode, unspecified: Secondary | ICD-10-CM

## 2014-12-19 DIAGNOSIS — F32A Depression, unspecified: Secondary | ICD-10-CM

## 2014-12-19 DIAGNOSIS — E119 Type 2 diabetes mellitus without complications: Secondary | ICD-10-CM | POA: Diagnosis not present

## 2014-12-19 DIAGNOSIS — E785 Hyperlipidemia, unspecified: Secondary | ICD-10-CM | POA: Diagnosis not present

## 2014-12-19 DIAGNOSIS — I2510 Atherosclerotic heart disease of native coronary artery without angina pectoris: Secondary | ICD-10-CM | POA: Diagnosis not present

## 2014-12-19 DIAGNOSIS — Z1239 Encounter for other screening for malignant neoplasm of breast: Secondary | ICD-10-CM

## 2014-12-19 DIAGNOSIS — E034 Atrophy of thyroid (acquired): Secondary | ICD-10-CM

## 2014-12-19 DIAGNOSIS — E038 Other specified hypothyroidism: Secondary | ICD-10-CM

## 2014-12-19 DIAGNOSIS — M545 Low back pain, unspecified: Secondary | ICD-10-CM

## 2014-12-19 DIAGNOSIS — M797 Fibromyalgia: Secondary | ICD-10-CM | POA: Diagnosis not present

## 2014-12-19 DIAGNOSIS — E039 Hypothyroidism, unspecified: Secondary | ICD-10-CM

## 2014-12-19 DIAGNOSIS — E669 Obesity, unspecified: Secondary | ICD-10-CM | POA: Diagnosis not present

## 2014-12-19 LAB — COMPREHENSIVE METABOLIC PANEL
ALK PHOS: 95 U/L (ref 39–117)
ALT: 12 U/L (ref 0–35)
AST: 22 U/L (ref 0–37)
Albumin: 4 g/dL (ref 3.5–5.2)
BUN: 7 mg/dL (ref 6–23)
CO2: 28 mEq/L (ref 19–32)
CREATININE: 0.79 mg/dL (ref 0.50–1.10)
Calcium: 9.3 mg/dL (ref 8.4–10.5)
Chloride: 99 mEq/L (ref 96–112)
GLUCOSE: 137 mg/dL — AB (ref 70–99)
POTASSIUM: 4.5 meq/L (ref 3.5–5.3)
Sodium: 137 mEq/L (ref 135–145)
Total Bilirubin: 0.4 mg/dL (ref 0.2–1.2)
Total Protein: 7 g/dL (ref 6.0–8.3)

## 2014-12-19 LAB — LIPID PANEL
CHOLESTEROL: 157 mg/dL (ref 0–200)
HDL: 44 mg/dL — AB (ref >=46)
LDL CALC: 47 mg/dL (ref 0–99)
TRIGLYCERIDES: 330 mg/dL — AB (ref <150)
Total CHOL/HDL Ratio: 3.6 Ratio
VLDL: 66 mg/dL — AB (ref 0–40)

## 2014-12-19 LAB — POCT GLYCOSYLATED HEMOGLOBIN (HGB A1C): Hemoglobin A1C: 7.7

## 2014-12-19 LAB — TSH: TSH: 0.958 u[IU]/mL (ref 0.350–4.500)

## 2014-12-19 MED ORDER — CITALOPRAM HYDROBROMIDE 40 MG PO TABS: 40.0000 mg | ORAL_TABLET | Freq: Every day | ORAL | Status: DC

## 2014-12-19 MED ORDER — CYCLOBENZAPRINE HCL 5 MG PO TABS: 5.0000 mg | ORAL_TABLET | Freq: Three times a day (TID) | ORAL | Status: DC | PRN

## 2014-12-19 MED ORDER — METFORMIN HCL 1000 MG PO TABS: 1000.0000 mg | ORAL_TABLET | Freq: Two times a day (BID) | ORAL | Status: DC

## 2014-12-19 MED ORDER — LEVOTHYROXINE SODIUM 75 MCG PO TABS: 37.5000 ug | ORAL_TABLET | Freq: Every day | ORAL | Status: DC

## 2014-12-19 MED ORDER — SIMVASTATIN 40 MG PO TABS: 40.0000 mg | ORAL_TABLET | Freq: Every day | ORAL | Status: DC

## 2014-12-19 MED ORDER — GABAPENTIN 300 MG PO CAPS: ORAL_CAPSULE | ORAL | Status: DC

## 2014-12-19 MED ORDER — ENALAPRIL MALEATE 10 MG PO TABS: ORAL_TABLET | ORAL | Status: DC

## 2014-12-19 MED ORDER — HYDROCODONE-ACETAMINOPHEN 5-325 MG PO TABS: 1.0000 | ORAL_TABLET | Freq: Two times a day (BID) | ORAL | Status: DC | PRN

## 2014-12-19 NOTE — Patient Instructions (Addendum)
Low Back Sprain with Rehab  A sprain is an injury in which a ligament is torn. The ligaments of the lower back are vulnerable to sprains. However, they are strong and require great force to be injured. These ligaments are important for stabilizing the spinal column. Sprains are classified into three categories. Grade 1 sprains cause pain, but the tendon is not lengthened. Grade 2 sprains include a lengthened ligament, due to the ligament being stretched or partially ruptured. With grade 2 sprains there is still function, although the function may be decreased. Grade 3 sprains involve a complete tear of the tendon or muscle, and function is usually impaired. SYMPTOMS   Severe pain in the lower back.  Sometimes, a feeling of a "pop," "snap," or tear, at the time of injury.  Tenderness and sometimes swelling at the injury site.  Uncommonly, bruising (contusion) within 48 hours of injury.  Muscle spasms in the back. CAUSES  Low back sprains occur when a force is placed on the ligaments that is greater than they can handle. Common causes of injury include:  Performing a stressful act while off-balance.  Repetitive stressful activities that involve movement of the lower back.  Direct hit (trauma) to the lower back. RISK INCREASES WITH:  Contact sports (football, wrestling).  Collisions (major skiing accidents).  Sports that require throwing or lifting (baseball, weightlifting).  Sports involving twisting of the spine (gymnastics, diving, tennis, golf).  Poor strength and flexibility.  Inadequate protection.  Previous back injury or surgery (especially fusion). PREVENTION  Wear properly fitted and padded protective equipment.  Warm up and stretch properly before activity.  Allow for adequate recovery between workouts.  Maintain physical fitness:  Strength, flexibility, and endurance.  Cardiovascular fitness.  Maintain a healthy body weight. PROGNOSIS  If treated  properly, low back sprains usually heal with non-surgical treatment. The length of time for healing depends on the severity of the injury.  RELATED COMPLICATIONS   Recurring symptoms, resulting in a chronic problem.  Chronic inflammation and pain in the low back.  Delayed healing or resolution of symptoms, especially if activity is resumed too soon.  Prolonged impairment.  Unstable or arthritic joints of the low back. TREATMENT  Treatment first involves the use of ice and medicine, to reduce pain and inflammation. The use of strengthening and stretching exercises may help reduce pain with activity. These exercises may be performed at home or with a therapist. Severe injuries may require referral to a therapist for further evaluation and treatment, such as ultrasound. Your caregiver may advise that you wear a back brace or corset, to help reduce pain and discomfort. Often, prolonged bed rest results in greater harm then benefit. Corticosteroid injections may be recommended. However, these should be reserved for the most serious cases. It is important to avoid using your back when lifting objects. At night, sleep on your back on a firm mattress, with a pillow placed under your knees. If non-surgical treatment is unsuccessful, surgery may be needed.  MEDICATION   If pain medicine is needed, nonsteroidal anti-inflammatory medicines (aspirin and ibuprofen), or other minor pain relievers (acetaminophen), are often advised.  Do not take pain medicine for 7 days before surgery.  Prescription pain relievers may be given, if your caregiver thinks they are needed. Use only as directed and only as much as you need.  Ointments applied to the skin may be helpful.  Corticosteroid injections may be given by your caregiver. These injections should be reserved for the most serious cases,   because they may only be given a certain number of times. HEAT AND COLD  Cold treatment (icing) should be applied for 10  to 15 minutes every 2 to 3 hours for inflammation and pain, and immediately after activity that aggravates your symptoms. Use ice packs or an ice massage.  Heat treatment may be used before performing stretching and strengthening activities prescribed by your caregiver, physical therapist, or athletic trainer. Use a heat pack or a warm water soak. SEEK MEDICAL CARE IF:   Symptoms get worse or do not improve in 2 to 4 weeks, despite treatment.  You develop numbness or weakness in either leg.  You lose bowel or bladder function.  Any of the following occur after surgery: fever, increased pain, swelling, redness, drainage of fluids, or bleeding in the affected area.  New, unexplained symptoms develop. (Drugs used in treatment may produce side effects.) EXERCISES  RANGE OF MOTION (ROM) AND STRETCHING EXERCISES - Low Back Sprain Most people with lower back pain will find that their symptoms get worse with excessive bending forward (flexion) or arching at the lower back (extension). The exercises that will help resolve your symptoms will focus on the opposite motion.  Your physician, physical therapist or athletic trainer will help you determine which exercises will be most helpful to resolve your lower back pain. Do not complete any exercises without first consulting with your caregiver. Discontinue any exercises which make your symptoms worse, until you speak to your caregiver. If you have pain, numbness or tingling which travels down into your buttocks, leg or foot, the goal of the therapy is for these symptoms to move closer to your back and eventually resolve. Sometimes, these leg symptoms will get better, but your lower back pain may worsen. This is often an indication of progress in your rehabilitation. Be very alert to any changes in your symptoms and the activities in which you participated in the 24 hours prior to the change. Sharing this information with your caregiver will allow him or her to  most efficiently treat your condition. These exercises may help you when beginning to rehabilitate your injury. Your symptoms may resolve with or without further involvement from your physician, physical therapist or athletic trainer. While completing these exercises, remember:   Restoring tissue flexibility helps normal motion to return to the joints. This allows healthier, less painful movement and activity.  An effective stretch should be held for at least 30 seconds.  A stretch should never be painful. You should only feel a gentle lengthening or release in the stretched tissue. FLEXION RANGE OF MOTION AND STRETCHING EXERCISES: STRETCH - Flexion, Single Knee to Chest   Lie on a firm bed or floor with both legs extended in front of you.  Keeping one leg in contact with the floor, bring your opposite knee to your chest. Hold your leg in place by either grabbing behind your thigh or at your knee.  Pull until you feel a gentle stretch in your low back. Hold __________ seconds.  Slowly release your grasp and repeat the exercise with the opposite side. Repeat __________ times. Complete this exercise __________ times per day.  STRETCH - Flexion, Double Knee to Chest  Lie on a firm bed or floor with both legs extended in front of you.  Keeping one leg in contact with the floor, bring your opposite knee to your chest.  Tense your stomach muscles to support your back and then lift your other knee to your chest. Hold your legs   in place by either grabbing behind your thighs or at your knees.  Pull both knees toward your chest until you feel a gentle stretch in your low back. Hold __________ seconds.  Tense your stomach muscles and slowly return one leg at a time to the floor. Repeat __________ times. Complete this exercise __________ times per day.  STRETCH - Low Trunk Rotation  Lie on a firm bed or floor. Keeping your legs in front of you, bend your knees so they are both pointed toward the  ceiling and your feet are flat on the floor.  Extend your arms out to the side. This will stabilize your upper body by keeping your shoulders in contact with the floor.  Gently and slowly drop both knees together to one side until you feel a gentle stretch in your low back. Hold for __________ seconds.  Tense your stomach muscles to support your lower back as you bring your knees back to the starting position. Repeat the exercise to the other side. Repeat __________ times. Complete this exercise __________ times per day  EXTENSION RANGE OF MOTION AND FLEXIBILITY EXERCISES: STRETCH - Extension, Prone on Elbows   Lie on your stomach on the floor, a bed will be too soft. Place your palms about shoulder width apart and at the height of your head.  Place your elbows under your shoulders. If this is too painful, stack pillows under your chest.  Allow your body to relax so that your hips drop lower and make contact more completely with the floor.  Hold this position for __________ seconds.  Slowly return to lying flat on the floor. Repeat __________ times. Complete this exercise __________ times per day.  RANGE OF MOTION - Extension, Prone Press Ups  Lie on your stomach on the floor, a bed will be too soft. Place your palms about shoulder width apart and at the height of your head.  Keeping your back as relaxed as possible, slowly straighten your elbows while keeping your hips on the floor. You may adjust the placement of your hands to maximize your comfort. As you gain motion, your hands will come more underneath your shoulders.  Hold this position __________ seconds.  Slowly return to lying flat on the floor. Repeat __________ times. Complete this exercise __________ times per day.  RANGE OF MOTION- Quadruped, Neutral Spine   Assume a hands and knees position on a firm surface. Keep your hands under your shoulders and your knees under your hips. You may place padding under your knees for  comfort.  Drop your head and point your tailbone toward the ground below you. This will round out your lower back like an angry cat. Hold this position for __________ seconds.  Slowly lift your head and release your tail bone so that your back sags into a large arch, like an old horse.  Hold this position for __________ seconds.  Repeat this until you feel limber in your low back.  Now, find your "sweet spot." This will be the most comfortable position somewhere between the two previous positions. This is your neutral spine. Once you have found this position, tense your stomach muscles to support your low back.  Hold this position for __________ seconds. Repeat __________ times. Complete this exercise __________ times per day.  STRENGTHENING EXERCISES - Low Back Sprain These exercises may help you when beginning to rehabilitate your injury. These exercises should be done near your "sweet spot." This is the neutral, low-back arch, somewhere between fully rounded   and fully arched, that is your least painful position. When performed in this safe range of motion, these exercises can be used for people who have either a flexion or extension based injury. These exercises may resolve your symptoms with or without further involvement from your physician, physical therapist or athletic trainer. While completing these exercises, remember:   Muscles can gain both the endurance and the strength needed for everyday activities through controlled exercises.  Complete these exercises as instructed by your physician, physical therapist or athletic trainer. Increase the resistance and repetitions only as guided.  You may experience muscle soreness or fatigue, but the pain or discomfort you are trying to eliminate should never worsen during these exercises. If this pain does worsen, stop and make certain you are following the directions exactly. If the pain is still present after adjustments, discontinue the  exercise until you can discuss the trouble with your caregiver. STRENGTHENING - Deep Abdominals, Pelvic Tilt   Lie on a firm bed or floor. Keeping your legs in front of you, bend your knees so they are both pointed toward the ceiling and your feet are flat on the floor.  Tense your lower abdominal muscles to press your low back into the floor. This motion will rotate your pelvis so that your tail bone is scooping upwards rather than pointing at your feet or into the floor. With a gentle tension and even breathing, hold this position for __________ seconds. Repeat __________ times. Complete this exercise __________ times per day.  STRENGTHENING - Abdominals, Crunches   Lie on a firm bed or floor. Keeping your legs in front of you, bend your knees so they are both pointed toward the ceiling and your feet are flat on the floor. Cross your arms over your chest.  Slightly tip your chin down without bending your neck.  Tense your abdominals and slowly lift your trunk high enough to just clear your shoulder blades. Lifting higher can put excessive stress on the lower back and does not further strengthen your abdominal muscles.  Control your return to the starting position. Repeat __________ times. Complete this exercise __________ times per day.  STRENGTHENING - Quadruped, Opposite UE/LE Lift   Assume a hands and knees position on a firm surface. Keep your hands under your shoulders and your knees under your hips. You may place padding under your knees for comfort.  Find your neutral spine and gently tense your abdominal muscles so that you can maintain this position. Your shoulders and hips should form a rectangle that is parallel with the floor and is not twisted.  Keeping your trunk steady, lift your right hand no higher than your shoulder and then your left leg no higher than your hip. Make sure you are not holding your breath. Hold this position for __________ seconds.  Continuing to keep  your abdominal muscles tense and your back steady, slowly return to your starting position. Repeat with the opposite arm and leg. Repeat __________ times. Complete this exercise __________ times per day.  STRENGTHENING - Abdominals and Quadriceps, Straight Leg Raise   Lie on a firm bed or floor with both legs extended in front of you.  Keeping one leg in contact with the floor, bend the other knee so that your foot can rest flat on the floor.  Find your neutral spine, and tense your abdominal muscles to maintain your spinal position throughout the exercise.  Slowly lift your straight leg off the floor about 6 inches for a count   of 15, making sure to not hold your breath.  Still keeping your neutral spine, slowly lower your leg all the way to the floor. Repeat this exercise with each leg __________ times. Complete this exercise __________ times per day. POSTURE AND BODY MECHANICS CONSIDERATIONS - Low Back Sprain Keeping correct posture when sitting, standing or completing your activities will reduce the stress put on different body tissues, allowing injured tissues a chance to heal and limiting painful experiences. The following are general guidelines for improved posture. Your physician or physical therapist will provide you with any instructions specific to your needs. While reading these guidelines, remember:  The exercises prescribed by your provider will help you have the flexibility and strength to maintain correct postures.  The correct posture provides the best environment for your joints to work. All of your joints have less wear and tear when properly supported by a spine with good posture. This means you will experience a healthier, less painful body.  Correct posture must be practiced with all of your activities, especially prolonged sitting and standing. Correct posture is as important when doing repetitive low-stress activities (typing) as it is when doing a single heavy-load  activity (lifting). RESTING POSITIONS Consider which positions are most painful for you when choosing a resting position. If you have pain with flexion-based activities (sitting, bending, stooping, squatting), choose a position that allows you to rest in a less flexed posture. You would want to avoid curling into a fetal position on your side. If your pain worsens with extension-based activities (prolonged standing, working overhead), avoid resting in an extended position such as sleeping on your stomach. Most people will find more comfort when they rest with their spine in a more neutral position, neither too rounded nor too arched. Lying on a non-sagging bed on your side with a pillow between your knees, or on your back with a pillow under your knees will often provide some relief. Keep in mind, being in any one position for a prolonged period of time, no matter how correct your posture, can still lead to stiffness. PROPER SITTING POSTURE In order to minimize stress and discomfort on your spine, you must sit with correct posture. Sitting with good posture should be effortless for a healthy body. Returning to good posture is a gradual process. Many people can work toward this most comfortably by using various supports until they have the flexibility and strength to maintain this posture on their own. When sitting with proper posture, your ears will fall over your shoulders and your shoulders will fall over your hips. You should use the back of the chair to support your upper back. Your lower back will be in a neutral position, just slightly arched. You may place a small pillow or folded towel at the base of your lower back for  support.  When working at a desk, create an environment that supports good, upright posture. Without extra support, muscles tire, which leads to excessive strain on joints and other tissues. Keep these recommendations in mind: CHAIR:  A chair should be able to slide under your desk  when your back makes contact with the back of the chair. This allows you to work closely.  The chair's height should allow your eyes to be level with the upper part of your monitor and your hands to be slightly lower than your elbows. BODY POSITION  Your feet should make contact with the floor. If this is not possible, use a foot rest.  Keep your   ears over your shoulders. This will reduce stress on your neck and low back. INCORRECT SITTING POSTURES  If you are feeling tired and unable to assume a healthy sitting posture, do not slouch or slump. This puts excessive strain on your back tissues, causing more damage and pain. Healthier options include:  Using more support, like a lumbar pillow.  Switching tasks to something that requires you to be upright or walking.  Talking a brief walk.  Lying down to rest in a neutral-spine position. PROLONGED STANDING WHILE SLIGHTLY LEANING FORWARD  When completing a task that requires you to lean forward while standing in one place for a long time, place either foot up on a stationary 2-4 inch high object to help maintain the best posture. When both feet are on the ground, the lower back tends to lose its slight inward curve. If this curve flattens (or becomes too large), then the back and your other joints will experience too much stress, tire more quickly, and can cause pain. CORRECT STANDING POSTURES Proper standing posture should be assumed with all daily activities, even if they only take a few moments, like when brushing your teeth. As in sitting, your ears should fall over your shoulders and your shoulders should fall over your hips. You should keep a slight tension in your abdominal muscles to brace your spine. Your tailbone should point down to the ground, not behind your body, resulting in an over-extended swayback posture.  INCORRECT STANDING POSTURES  Common incorrect standing postures include a forward head, locked knees and/or an excessive  swayback. WALKING Walk with an upright posture. Your ears, shoulders and hips should all line-up. PROLONGED ACTIVITY IN A FLEXED POSITION When completing a task that requires you to bend forward at your waist or lean over a low surface, try to find a way to stabilize 3 out of 4 of your limbs. You can place a hand or elbow on your thigh or rest a knee on the surface you are reaching across. This will provide you more stability, so that your muscles do not tire as quickly. By keeping your knees relaxed, or slightly bent, you will also reduce stress across your lower back. CORRECT LIFTING TECHNIQUES DO :  Assume a wide stance. This will provide you more stability and the opportunity to get as close as possible to the object which you are lifting.  Tense your abdominals to brace your spine. Bend at the knees and hips. Keeping your back locked in a neutral-spine position, lift using your leg muscles. Lift with your legs, keeping your back straight.  Test the weight of unknown objects before attempting to lift them.  Try to keep your elbows locked down at your sides in order get the best strength from your shoulders when carrying an object.  Always ask for help when lifting heavy or awkward objects. INCORRECT LIFTING TECHNIQUES DO NOT:   Lock your knees when lifting, even if it is a small object.  Bend and twist. Pivot at your feet or move your feet when needing to change directions.  Assume that you can safely pick up even a paperclip without proper posture. Document Released: 09/14/2005 Document Revised: 12/07/2011 Document Reviewed: 12/27/2008 [employer] Patient Information 2015 [employer]. This information is not intended to replace advice given to you by your health care provider. Make sure you discuss any questions you have with your health care provider.  

## 2014-12-19 NOTE — Progress Notes (Signed)
Subjective:    Patient ID: Anna Noble, female    DOB: May 25, 1956, 59 y.o.   MRN: [id number]  12/19/2014  Follow-up; Diabetes; Hyperlipidemia; Hypertension; and Hypothyroidism   HPI This 59 y.o. female presents for six month follow-up of the following:  1.  DMII:  HgbA1c 7.0 at last visit.  No changes to management made at last visit.  Checking sugars; running 130s fastings.  Metformin  bid.  Last eye exam 01/2014 [employer].    2.  HTN: blood pressure elevated today; checking BP at home; running 140/80. Enalapril  bid.  HCTZ 12.5mg  daily.   Patient reports good compliance with medication, good tolerance to medication, and good symptom control.    3.  Hypercholesterolemia:    Patient reports good compliance with medication, good tolerance to medication, and good symptom control.  Taking Simvastatin  daily.  4.  Hypothyroidism: due for TSH; Patient reports good compliance with medication, good tolerance to medication, and good symptom control.    5.  Fibromyalgia:  Working two nights per week.  Increased Gabapentin two at bedtime; added a dose midday; sometimes needs midday dose and sometimes does not. Needs refill of Hydrocodone.  Chronic lower back pain and R hip pain; pain radiates to R hip; no n/t/w.  No b/b dysfunction; no saddle paresthesias.  Has suffered with lower back pain for years.  6. CAD:  Due in July for cardiology follow-up; to call in May.  Denies CP/DOE.  Smoking 1/2-1 ppd.  7. Health  Maintenance: agreeable to flu vaccine and TDAP and mammogram.  Agreeable to CPE at next visit.   Review of Systems  Constitutional: Negative for fever, chills, diaphoresis and fatigue.  Eyes: Negative for visual disturbance.  Respiratory: Negative for cough and shortness of breath.   Cardiovascular: Negative for chest pain, palpitations and leg swelling.  Gastrointestinal: Negative for nausea, vomiting, abdominal pain, diarrhea and constipation.  Endocrine: Negative  for cold intolerance, heat intolerance, polydipsia, polyphagia and polyuria.  Musculoskeletal: Positive for myalgias and back pain.  Skin: Negative for color change, pallor, rash and wound.  Neurological: Negative for dizziness, tremors, seizures, syncope, facial asymmetry, speech difficulty, weakness, light-headedness, numbness and headaches.  Psychiatric/Behavioral: Positive for dysphoric mood. Negative for suicidal ideas, sleep disturbance and self-injury. The patient is not nervous/anxious.     Past Medical History  Diagnosis Date  . Coronary atherosclerosis of native coronary artery   . Type II or unspecified type diabetes mellitus without mention of complication, not stated as uncontrolled   . Other and unspecified hyperlipidemia   . Unspecified essential hypertension   . Dysmetabolic syndrome X   . Acute myocardial infarction of other anterior wall, episode of care unspecified   . Obesity, unspecified   . Tobacco use disorder   . Depression   . Thyroid disease   . Allergy   . Arthritis   . Neuromuscular disorder    Past Surgical History  Procedure Laterality Date  . Tendinitis surgery    . Knee arthroscopy    . Hand surgery    . Eye surgery      Strabismus L eye  . Toe surgery    . Cardiac catheterization    . Cardiac stenting    . Foot surgery      Achille's repair  . Tubal ligation     Allergies  Allergen Reactions  . Aspirin   . Latex   . Nsaids   . Penicillins   . Sulfonamide Derivatives  Current Outpatient Prescriptions  Medication Sig Dispense Refill  . carvedilol (COREG) 6.25 MG tablet Take 2 tablets (12.5 mg total) by mouth 2 (two) times daily. 120 tablet 6  . citalopram (CELEXA) 40 MG tablet Take 1 tablet (40 mg total) by mouth daily. 30 tablet 5  . clopidogrel (PLAVIX) 75 MG tablet Take 1 tablet (75 mg total) by mouth daily. 30 tablet 0  . cyclobenzaprine (FLEXERIL) 5 MG tablet Take 1 tablet (5 mg total) by mouth 3 (three) times daily as needed. 90  tablet 5  . enalapril (VASOTEC) 10 MG tablet TAKE ONE TABLET BY MOUTH TWICE DAILY 60 tablet 11  . gabapentin (NEURONTIN) 300 MG capsule Take one capsule by mouth in the morning, one by mouth in the afternoon, and two at bedtime 120 capsule 5  . hydrochlorothiazide (HYDRODIURIL) 12.5 MG tablet Take 1 tablet (12.5 mg total) by mouth daily. 30 tablet 5  . HYDROcodone-acetaminophen (NORCO/VICODIN) 5-325 MG per tablet Take 1 tablet by mouth 2 (two) times daily as needed for moderate pain. 30 tablet 0  . levothyroxine (SYNTHROID, LEVOTHROID) 75 MCG tablet Take 0.5 tablets (37.5 mcg total) by mouth daily. 30 tablet 5  . nitroGLYCERIN (NITROSTAT) 0.4 MG SL tablet Place 1 tablet (0.4 mg total) under the tongue every 5 (five) minutes as needed. 25 tablet 11  . simvastatin (ZOCOR) 40 MG tablet Take 1 tablet (40 mg total) by mouth at bedtime. 30 tablet 5  . metFORMIN (GLUCOPHAGE) 1000 MG tablet Take 1 tablet (1,000 mg total) by mouth 2 (two) times daily with a meal. 60 tablet 5   No current facility-administered medications for this visit.       Objective:    BP 146/80 mmHg  Pulse 77  Temp(Src) 98.9 F (37.2 C) (Oral)  Resp 16  Ht  (1.727 m)  Wt 203 lb 9.6 oz (92.352 kg)  BMI 30.96 kg/m2  SpO2 98% Physical Exam  Constitutional: She is oriented to person, place, and time. She appears well-developed and well-nourished. No distress.  HENT:  Head: Normocephalic and atraumatic.  Right Ear: External ear normal.  Left Ear: External ear normal.  Nose: Nose normal.  Mouth/Throat: Oropharynx is clear and moist.  Eyes: Conjunctivae and EOM are normal. Pupils are equal, round, and reactive to light.  Neck: Normal range of motion. Neck supple. Carotid bruit is not present. No thyromegaly present.  Cardiovascular: Normal rate, regular rhythm, normal heart sounds and intact distal pulses.  Exam reveals no gallop and no friction rub.   No murmur heard. Pulmonary/Chest: Effort normal and breath sounds  normal. She has no wheezes. She has no rales.  Abdominal: Soft. Bowel sounds are normal. She exhibits no distension and no mass. There is no tenderness. There is no rebound and no guarding.  Musculoskeletal:       Right shoulder: She exhibits decreased range of motion and pain. She exhibits no tenderness, no bony tenderness and normal strength.  Lumbar spine:  Non-tender midline; non-tender paraspinal regions B.  Straight leg raises negative B; toe and heel walking intact; marching intact; motor 5/5 BLE.  Decreased ROM lumbar spine due to pain.   Lymphadenopathy:    She has no cervical adenopathy.  Neurological: She is alert and oriented to person, place, and time. No cranial nerve deficit.  Skin: Skin is warm and dry. No rash noted. She is not diaphoretic. No erythema. No pallor.  Psychiatric: She has a normal mood and affect. Her behavior is normal.   Results  for orders placed or performed in visit on 12/19/14  POCT glycosylated hemoglobin (Hb A1C)  Result Value Ref Range   Hemoglobin A1C 7.7    [facility] reading (PRIMARY) by  Dr. Katrinka BlazingSmith.  Lumbar spine films: NAD.      Assessment & Plan:   1. Type 2 diabetes mellitus without complication   2. Hyperlipidemia   3. Hypothyroidism due to acquired atrophy of thyroid   4. Fibromyalgia   5. Obesity   6. Atherosclerosis of native coronary artery of native heart without angina pectoris   7. Right-sided low back pain without sciatica   8. Breast cancer screening   9. Depression   10. Hypothyroidism, unspecified hypothyroidism type     1. DMII: uncontrolled; obtain labs; refill provided.  Pt non-compliant with diabetic diet; desires three month trial of dietary modification.  If still above 7.0 at next visit, will add Januvia. 2. HTN: moderately controlled; obtain labs; refills provided. 3.  Hypothyroidism: controlled; obtain labs; refill provided. 4.  Hyperlipidemia: controlled; obtain labs; refill provided. 5.  Fibromyalgia: stable; refill  of Hydrocodone provided for sparing use; continue Flexeril and Gabapentin. 6.  Obesity: recommend weight loss and exercise. 7.  CAD: asymptomatic; followed annually by cardiology; visit due in July. 8.  Lower back pain R sided: New. Obtain LS spine films; home exercise program provided.  Continue Flexeril tid scheduled.  9. Breast cancer screening: refer for mammogram. 10. s /p flu vaccine and TDAP.   Meds ordered this encounter  Medications  . HYDROcodone-acetaminophen (NORCO/VICODIN) 5-325 MG per tablet    Sig: Take 1 tablet by mouth 2 (two) times daily as needed for moderate pain.    Dispense:  30 tablet    Refill:  0  . citalopram (CELEXA) 40 MG tablet    Sig: Take 1 tablet (40 mg total) by mouth daily.    Dispense:  30 tablet    Refill:  5  . cyclobenzaprine (FLEXERIL) 5 MG tablet    Sig: Take 1 tablet (5 mg total) by mouth 3 (three) times daily as needed.    Dispense:  90 tablet    Refill:  5  . enalapril (VASOTEC) 10 MG tablet    Sig: TAKE ONE TABLET BY MOUTH TWICE DAILY    Dispense:  60 tablet    Refill:  11  . gabapentin (NEURONTIN) 300 MG capsule    Sig: Take one capsule by mouth in the morning, one by mouth in the afternoon, and two at bedtime    Dispense:  120 capsule    Refill:  5  . levothyroxine (SYNTHROID, LEVOTHROID) 75 MCG tablet    Sig: Take 0.5 tablets (37.5 mcg total) by mouth daily.    Dispense:  30 tablet    Refill:  5  . simvastatin (ZOCOR) 40 MG tablet    Sig: Take 1 tablet (40 mg total) by mouth at bedtime.    Dispense:  30 tablet    Refill:  5  . metFORMIN (GLUCOPHAGE) 1000 MG tablet    Sig: Take 1 tablet (1,000 mg total) by mouth 2 (two) times daily with a meal.    Dispense:  60 tablet    Refill:  5    Return in about 3 months (around 03/21/2015) for complete physical examiniation.     Williamson Cavanah Paulita FujitaMartin Ceejay Kegley, M.D. [facility]  [place] [address] [place], [place]  [id number] [phone] phone [phone] fax

## 2014-12-20 ENCOUNTER — Telehealth: Payer: Self-pay

## 2014-12-20 NOTE — Telephone Encounter (Signed)
See labs 

## 2014-12-20 NOTE — Telephone Encounter (Signed)
Pt called missed her CB and would like another. Please advise at [phone]

## 2015-01-17 ENCOUNTER — Telehealth: Payer: Self-pay

## 2015-01-17 MED ORDER — CLOPIDOGREL BISULFATE 75 MG PO TABS: 75.0000 mg | ORAL_TABLET | Freq: Every day | ORAL | Status: DC

## 2015-01-17 NOTE — Telephone Encounter (Signed)
Pt requesting refill on clopidogrel (PLAVIX) 75 MG tablet [[id number]] please advise

## 2015-01-17 NOTE — Telephone Encounter (Signed)
Done  Rx sent

## 2015-03-25 ENCOUNTER — Other Ambulatory Visit: Payer: Self-pay

## 2015-04-17 ENCOUNTER — Encounter: Payer: Self-pay | Admitting: Family Medicine

## 2015-04-17 ENCOUNTER — Ambulatory Visit (INDEPENDENT_AMBULATORY_CARE_PROVIDER_SITE_OTHER): Payer: 59 | Admitting: Family Medicine

## 2015-04-17 VITALS — BP 150/78 | HR 79 | Temp 97.9°F | Resp 16 | Ht 67.0 in | Wt 203.2 lb

## 2015-04-17 DIAGNOSIS — F32A Depression, unspecified: Secondary | ICD-10-CM

## 2015-04-17 DIAGNOSIS — F329 Major depressive disorder, single episode, unspecified: Secondary | ICD-10-CM

## 2015-04-17 DIAGNOSIS — E039 Hypothyroidism, unspecified: Secondary | ICD-10-CM

## 2015-04-17 DIAGNOSIS — E1142 Type 2 diabetes mellitus with diabetic polyneuropathy: Secondary | ICD-10-CM

## 2015-04-17 DIAGNOSIS — Z720 Tobacco use: Secondary | ICD-10-CM | POA: Diagnosis not present

## 2015-04-17 DIAGNOSIS — Z1239 Encounter for other screening for malignant neoplasm of breast: Secondary | ICD-10-CM | POA: Diagnosis not present

## 2015-04-17 DIAGNOSIS — M545 Low back pain, unspecified: Secondary | ICD-10-CM

## 2015-04-17 DIAGNOSIS — F17200 Nicotine dependence, unspecified, uncomplicated: Secondary | ICD-10-CM

## 2015-04-17 DIAGNOSIS — I2510 Atherosclerotic heart disease of native coronary artery without angina pectoris: Secondary | ICD-10-CM

## 2015-04-17 DIAGNOSIS — I10 Essential (primary) hypertension: Secondary | ICD-10-CM | POA: Diagnosis not present

## 2015-04-17 DIAGNOSIS — E785 Hyperlipidemia, unspecified: Secondary | ICD-10-CM | POA: Diagnosis not present

## 2015-04-17 DIAGNOSIS — M797 Fibromyalgia: Secondary | ICD-10-CM | POA: Diagnosis not present

## 2015-04-17 LAB — CBC WITH DIFFERENTIAL/PLATELET
BASOS ABS: 0 10*3/uL (ref 0.0–0.1)
Basophils Relative: 0 % (ref 0–1)
EOS ABS: 0.2 10*3/uL (ref 0.0–0.7)
EOS PCT: 2 % (ref 0–5)
HEMATOCRIT: 37.1 % (ref 36.0–46.0)
Hemoglobin: 12.5 g/dL (ref 12.0–15.0)
LYMPHS PCT: 28 % (ref 12–46)
Lymphs Abs: 2.8 10*3/uL (ref 0.7–4.0)
MCH: 29.7 pg (ref 26.0–34.0)
MCHC: 33.7 g/dL (ref 30.0–36.0)
MCV: 88.1 fL (ref 78.0–100.0)
MONO ABS: 0.5 10*3/uL (ref 0.1–1.0)
MPV: 9.2 fL (ref 8.6–12.4)
Monocytes Relative: 5 % (ref 3–12)
Neutro Abs: 6.5 10*3/uL (ref 1.7–7.7)
Neutrophils Relative %: 65 % (ref 43–77)
Platelets: 225 10*3/uL (ref 150–400)
RBC: 4.21 MIL/uL (ref 3.87–5.11)
RDW: 13.4 % (ref 11.5–15.5)
WBC: 10 10*3/uL (ref 4.0–10.5)

## 2015-04-17 LAB — COMPREHENSIVE METABOLIC PANEL
ALBUMIN: 3.9 g/dL (ref 3.5–5.2)
ALT: 8 U/L (ref 0–35)
AST: 15 U/L (ref 0–37)
Alkaline Phosphatase: 84 U/L (ref 39–117)
BUN: 7 mg/dL (ref 6–23)
CALCIUM: 9 mg/dL (ref 8.4–10.5)
CHLORIDE: 102 meq/L (ref 96–112)
CO2: 27 meq/L (ref 19–32)
Creat: 0.67 mg/dL (ref 0.50–1.10)
Glucose, Bld: 139 mg/dL — ABNORMAL HIGH (ref 70–99)
Potassium: 4.2 mEq/L (ref 3.5–5.3)
Sodium: 140 mEq/L (ref 135–145)
Total Bilirubin: 0.4 mg/dL (ref 0.2–1.2)
Total Protein: 6.6 g/dL (ref 6.0–8.3)

## 2015-04-17 LAB — LIPID PANEL
CHOL/HDL RATIO: 3.6 ratio
Cholesterol: 149 mg/dL (ref 0–200)
HDL: 41 mg/dL — ABNORMAL LOW (ref >=46)
LDL Cholesterol: 44 mg/dL (ref 0–99)
Triglycerides: 318 mg/dL — ABNORMAL HIGH (ref <150)
VLDL: 64 mg/dL — ABNORMAL HIGH (ref 0–40)

## 2015-04-17 LAB — POCT GLYCOSYLATED HEMOGLOBIN (HGB A1C): HEMOGLOBIN A1C: 7.3

## 2015-04-17 LAB — GLUCOSE, POCT (MANUAL RESULT ENTRY): POC GLUCOSE: 134 mg/dL — AB (ref 70–99)

## 2015-04-17 MED ORDER — HYDROCHLOROTHIAZIDE 25 MG PO TABS: 25.0000 mg | ORAL_TABLET | Freq: Every day | ORAL | Status: DC

## 2015-04-17 NOTE — Patient Instructions (Addendum)
1. Restart Zyrtec 10mg  one tablet daily.  Hypertension Hypertension, commonly called high blood pressure, is when the force of blood pumping through your arteries is too strong. Your arteries are the blood vessels that carry blood from your heart throughout your body. A blood pressure reading consists of a higher number over a lower number, such as 110/72. The higher number (systolic) is the pressure inside your arteries when your heart pumps. The lower number (diastolic) is the pressure inside your arteries when your heart relaxes. Ideally you want your blood pressure below 120/80. Hypertension forces your heart to work harder to pump blood. Your arteries may become narrow or stiff. Having hypertension puts you at risk for heart disease, stroke, and other problems.  RISK FACTORS Some risk factors for high blood pressure are controllable. Others are not.  Risk factors you cannot control include:   Race. You may be at higher risk if you are African American.  Age. Risk increases with age.  Gender. Men are at higher risk than women before age 60 years. After age 60, women are at higher risk than men. Risk factors you can control include:  Not getting enough exercise or physical activity.  Being overweight.  Getting too much fat, sugar, calories, or salt in your diet.  Drinking too much alcohol. SIGNS AND SYMPTOMS Hypertension does not usually cause signs or symptoms. Extremely high blood pressure (hypertensive crisis) may cause headache, anxiety, shortness of breath, and nosebleed. DIAGNOSIS  To check if you have hypertension, your health care provider will measure your blood pressure while you are seated, with your arm held at the level of your heart. It should be measured at least twice using the same arm. Certain conditions can cause a difference in blood pressure between your right and left arms. A blood pressure reading that is higher than normal on one occasion does not mean that you  need treatment. If one blood pressure reading is high, ask your health care provider about having it checked again. TREATMENT  Treating high blood pressure includes making lifestyle changes and possibly taking medicine. Living a healthy lifestyle can help lower high blood pressure. You may need to change some of your habits. Lifestyle changes may include:  Following the DASH diet. This diet is high in fruits, vegetables, and whole grains. It is low in salt, red meat, and added sugars.  Getting at least 2 hours of brisk physical activity every week.  Losing weight if necessary.  Not smoking.  Limiting alcoholic beverages.  Learning ways to reduce stress. If lifestyle changes are not enough to get your blood pressure under control, your health care provider may prescribe medicine. You may need to take more than one. Work closely with your health care provider to understand the risks and benefits. HOME CARE INSTRUCTIONS  Have your blood pressure rechecked as directed by your health care provider.   Take medicines only as directed by your health care provider. Follow the directions carefully. Blood pressure medicines must be taken as prescribed. The medicine does not work as well when you skip doses. Skipping doses also puts you at risk for problems.   Do not smoke.   Monitor your blood pressure at home as directed by your health care provider. SEEK MEDICAL CARE IF:   You think you are having a reaction to medicines taken.  You have recurrent headaches or feel dizzy.  You have swelling in your ankles.  You have trouble with your vision. SEEK IMMEDIATE MEDICAL CARE IF:  You develop a severe headache or confusion.  You have unusual weakness, numbness, or feel faint.  You have severe chest or abdominal pain.  You vomit repeatedly.  You have trouble breathing. MAKE SURE YOU:   Understand these instructions.  Will watch your condition.  Will get help right away if you  are not doing well or get worse. Document Released: 09/14/2005 Document Revised: 01/29/2014 Document Reviewed: 07/07/2013 [facility] Patient Information 2015 [place], [place]. This information is not intended to replace advice given to you by your health care provider. Make sure you discuss any questions you have with your health care provider.

## 2015-04-17 NOTE — Progress Notes (Signed)
Subjective:    Patient ID: Anna Noble, female    DOB: 05-03-1956, 59 y.o.   MRN: [id number]  04/17/2015  Follow-up; Diabetes; Hypertension; and Medication Refill   HPI This 59 y.o. female presents for three months ago:  1.  DMII: Patient reports good compliance with medication, good tolerance to medication, and good symptom control.  HgbA1c last visit of 7.7.  Has decreased sugar intake.  Checking sugars once weekly; running 130s.  2. Hyperlipidemia: Patient reports good compliance with medication, good tolerance to medication, and good symptom control.    3.  HTN:  Patient reports good compliance with medication, good tolerance to medication, and good symptom control.  Home BPs running 197/102 three weeks ago.  4. Hypothyroidism: Patient reports good compliance with medication, good tolerance to medication, and good symptom control.  Recent TSH WNL.  5.  Fibromyalgia:  Worsening lately with the heat.    6.  Lower back pain and B hips: killing pt.  Intermittent R leg radiation.  No n/t/w. No b/b function.  No saddle paresthesias.  Doing home exercise program three times per week.  Flexeril tid; Gabapentin tid; taking hydrocodone sparingly.  Celexa is for fibromyalgia.  7.  Depression: Patient reports good compliance with medication, good tolerance to medication, and good symptom control.  No SI; depression has worsened.  Lots of stressors at work.    8. CAD: taking Plavix daily.  Last office visit with cardiology in 11/2013.      Review of Systems  Constitutional: Negative for fever, chills, diaphoresis and fatigue.  Eyes: Negative for visual disturbance.  Respiratory: Negative for cough and shortness of breath.   Cardiovascular: Negative for chest pain, palpitations and leg swelling.  Gastrointestinal: Negative for nausea, vomiting, abdominal pain, diarrhea and constipation.  Endocrine: Negative for cold intolerance, heat intolerance, polydipsia, polyphagia and polyuria.    Musculoskeletal: Positive for myalgias, back pain and arthralgias.  Neurological: Negative for dizziness, tremors, seizures, syncope, facial asymmetry, speech difficulty, weakness, light-headedness, numbness and headaches.  Psychiatric/Behavioral: Positive for dysphoric mood. Negative for suicidal ideas, sleep disturbance and self-injury. The patient is nervous/anxious.     Past Medical History  Diagnosis Date  . Coronary atherosclerosis of native coronary artery   . Type II or unspecified type diabetes mellitus without mention of complication, not stated as uncontrolled   . Other and unspecified hyperlipidemia   . Unspecified essential hypertension   . Dysmetabolic syndrome X   . Acute myocardial infarction of other anterior wall, episode of care unspecified   . Obesity, unspecified   . Tobacco use disorder   . Depression   . Thyroid disease   . Allergy   . Arthritis   . Neuromuscular disorder    Past Surgical History  Procedure Laterality Date  . Tendinitis surgery    . Knee arthroscopy    . Hand surgery    . Eye surgery      Strabismus L eye  . Toe surgery    . Cardiac catheterization    . Cardiac stenting    . Foot surgery      Achille's repair  . Tubal ligation     Allergies  Allergen Reactions  . Aspirin   . Latex   . Nsaids   . Penicillins   . Sulfonamide Derivatives    Social History   Social History  . Marital Status: Divorced    Spouse Name: N/A  . Number of Children: 2  . Years of Education: N/A  Occupational History  . 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 Smoking status: Former Smoker -- 0.50 packs/day for 20 years    Types: Cigarettes    Quit date: 12/18/2013  . Smokeless tobacco: Never Used  . Alcohol Use: 1.2 oz/week    2 Cans of beer per week     Comment: twice a week.  . Drug Use: No  . Sexual Activity: Yes    Birth Control/ Protection: None   Other Topics Concern  . Not on file   Social  History Narrative   Marital status: Divorced after 30 years of marriage in 2007.  Dating same gentleman x 5 years.   Children: 2 children, no grandchildren.   Living: with boyfriend.   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   Tobacco: 1/2 ppd x 15 years.   Alcohol: beer socially.   Drugs: none   Exercise: walking three days per week   Family History  Problem Relation Age of Onset  . Coronary artery disease    . Down syndrome      child  . Dementia Mother   . Hypertension Mother   . Diabetes Mother   . Depression Father   . Heart disease Father 79    AMI age 59  . Hypertension Brother   . Arthritis Brother         Objective:    BP 150/78 mmHg  Pulse 79  Temp(Src) 97.9 F (36.6 C) (Oral)  Resp 16  Ht 5\' 7"  (1.702 m)  Wt 203 lb 3.2 oz (92.171 kg)  BMI 31.82 kg/m2  SpO2 97% Physical Exam  Constitutional: She is oriented to person, place, and time. She appears well-developed and well-nourished. No distress.  HENT:  Head: Normocephalic and atraumatic.  Right Ear: External ear normal.  Left Ear: External ear normal.  Nose: Nose normal.  Mouth/Throat: Oropharynx is clear and moist.  Eyes: Conjunctivae and EOM are normal. Pupils are equal, round, and reactive to light.  Neck: Normal range of motion. Neck supple. Carotid bruit is not present. No thyromegaly present.  Cardiovascular: Normal rate, regular rhythm, normal heart sounds and intact distal pulses.  Exam reveals no gallop and no friction rub.   No murmur heard. Pulmonary/Chest: Effort normal and breath sounds normal. She has no wheezes. She has no rales.  Abdominal: Soft. Bowel sounds are normal. She exhibits no distension and no mass. There is no tenderness. There is no rebound and no guarding.  Musculoskeletal:       Lumbar back: She exhibits decreased range of motion, tenderness, pain and spasm.  Lymphadenopathy:    She has no cervical adenopathy.  Neurological: She is alert and  oriented to person, place, and time. No cranial nerve deficit.  Skin: Skin is warm and dry. No rash noted. She is not diaphoretic. No erythema. No pallor.  Psychiatric: She has a normal mood and affect. Her behavior is normal.    [facility] reading (PRIMARY) by  Dr. Katrinka Blazing. Lumbar spine films:  Mild degenerative changes; no acute process.      Assessment & Plan:   1. Essential hypertension   2. TOBACCO USER   3. Hyperlipidemia   4. Type 2 diabetes mellitus with diabetic polyneuropathy   5. Coronary artery disease involving native coronary artery of native heart without angina pectoris   6. Essential hypertension, benign   7. Bilateral low back pain without sciatica  8. Breast cancer screening   9. Depression   10. Fibromyalgia   11. Hypothyroidism, unspecified hypothyroidism type    1. HTN: uncontrolled; increase HCTZ to  daily; obtain labs; refills provided. 2.  Tobacco abuse: encourage cessation; precontemplative. 3.  Hyperlipidemia: controlled; obtain labs; refills provided. 4.  DMII: moderate control; obtain labs; continue current medications. 5.  CAD: stable; asymptomatic; recommend follow-up annually with cardiology. 6.  Lower back pain: worsening; refer to ortho. 7.  Breast cancer screening: refer for mammogram.   Orders Placed This Encounter  Procedures  . MM Digital Screening    Standing Status: Future     Number of Occurrences:      Standing Expiration Date: 06/17/2016    Order Specific Question:  Reason for Exam (SYMPTOM  OR DIAGNOSIS REQUIRED)    Answer:  annual screening    Order Specific Question:  Is the patient pregnant?    Answer:  No    Order Specific Question:  Preferred imaging location?    Answer:  [facility]  . CBC with Differential/Platelet  . Comprehensive metabolic panel    Order Specific Question:  Has the patient fasted?    Answer:  Yes  . Lipid panel    Order Specific Question:  Has the patient fasted?    Answer:  Yes  . Ambulatory  referral to Orthopedic Surgery    Referral Priority:  Routine    Referral Type:  Surgical    Referral Reason:  Specialty Services Required    Requested Specialty:  Orthopedic Surgery    Number of Visits Requested:  1  . POCT glycosylated hemoglobin (Hb A1C)  . POCT glucose (manual entry)    Meds ordered this encounter  Medications  . hydrochlorothiazide (HYDRODIURIL) 25 MG tablet    Sig: Take 1 tablet (25 mg total) by mouth daily.    Dispense:  30 tablet    Refill:  5  . carvedilol (COREG) 12.5 MG tablet    Sig: Take 1 tablet (12.5 mg total) by mouth 2 (two) times daily.    Dispense:  60 tablet    Refill:  5  . citalopram (CELEXA) 40 MG tablet    Sig: Take 1 tablet (40 mg total) by mouth daily.    Dispense:  30 tablet    Refill:  5  . cyclobenzaprine (FLEXERIL) 5 MG tablet    Sig: Take 1 tablet (5 mg total) by mouth 3 (three) times daily as needed.    Dispense:  90 tablet    Refill:  5  . enalapril (VASOTEC) 10 MG tablet    Sig: TAKE ONE TABLET BY MOUTH TWICE DAILY    Dispense:  60 tablet    Refill:  11  . gabapentin (NEURONTIN) 300 MG capsule    Sig: Take one capsule by mouth in the morning, one by mouth in the afternoon, and two at bedtime    Dispense:  120 capsule    Refill:  5  . levothyroxine (SYNTHROID, LEVOTHROID) 75 MCG tablet    Sig: Take 0.5 tablets (37.5 mcg total) by mouth daily.    Dispense:  30 tablet    Refill:  5  . metFORMIN (GLUCOPHAGE) 1000 MG tablet    Sig: Take 1 tablet (1,000 mg total) by mouth 2 (two) times daily with a meal.    Dispense:  60 tablet    Refill:  5  . simvastatin (ZOCOR) 40 MG tablet    Sig: Take 1 tablet (40 mg total) by mouth  at bedtime.    Dispense:  30 tablet    Refill:  5    Return in about 3 months (around 07/18/2015) for complete physical examiniation.    Jonetta Dagley Paulita FujitaMartin Ramata Strothman, M.D. [facility]  [place] [address] [place], [place]  [id number] [phone] phone [phone] fax

## 2015-04-18 ENCOUNTER — Other Ambulatory Visit: Payer: Self-pay

## 2015-04-18 DIAGNOSIS — Z1231 Encounter for screening mammogram for malignant neoplasm of breast: Secondary | ICD-10-CM

## 2015-04-19 ENCOUNTER — Other Ambulatory Visit: Payer: Self-pay | Admitting: Family Medicine

## 2015-04-26 ENCOUNTER — Ambulatory Visit
Admission: RE | Admit: 2015-04-26 | Discharge: 2015-04-26 | Disposition: A | Discharge status: Home / Self Care | Payer: 59 | Source: Ambulatory Visit | Attending: Family Medicine | Admitting: Family Medicine

## 2015-04-26 DIAGNOSIS — Z1239 Encounter for other screening for malignant neoplasm of breast: Secondary | ICD-10-CM

## 2015-05-18 ENCOUNTER — Other Ambulatory Visit: Payer: Self-pay | Admitting: Family Medicine

## 2015-05-28 MED ORDER — LEVOTHYROXINE SODIUM 75 MCG PO TABS: 37.5000 ug | ORAL_TABLET | Freq: Every day | ORAL | Status: DC

## 2015-05-28 MED ORDER — SIMVASTATIN 40 MG PO TABS: 40.0000 mg | ORAL_TABLET | Freq: Every day | ORAL | Status: DC

## 2015-05-28 MED ORDER — ENALAPRIL MALEATE 10 MG PO TABS: ORAL_TABLET | ORAL | Status: DC

## 2015-05-28 MED ORDER — CITALOPRAM HYDROBROMIDE 40 MG PO TABS: 40.0000 mg | ORAL_TABLET | Freq: Every day | ORAL | Status: DC

## 2015-05-28 MED ORDER — METFORMIN HCL 1000 MG PO TABS: 1000.0000 mg | ORAL_TABLET | Freq: Two times a day (BID) | ORAL | Status: DC

## 2015-05-28 MED ORDER — GABAPENTIN 300 MG PO CAPS: ORAL_CAPSULE | ORAL | Status: DC

## 2015-05-28 MED ORDER — CYCLOBENZAPRINE HCL 5 MG PO TABS: 5.0000 mg | ORAL_TABLET | Freq: Three times a day (TID) | ORAL | Status: DC | PRN

## 2015-05-28 MED ORDER — CARVEDILOL 12.5 MG PO TABS: 12.5000 mg | ORAL_TABLET | Freq: Two times a day (BID) | ORAL | Status: DC

## 2015-07-19 ENCOUNTER — Other Ambulatory Visit: Payer: Self-pay | Admitting: Physician Assistant

## 2015-07-24 ENCOUNTER — Ambulatory Visit: Payer: 59 | Admitting: Family Medicine

## 2015-07-28 ENCOUNTER — Other Ambulatory Visit: Payer: Self-pay | Admitting: Physician Assistant

## 2015-07-29 ENCOUNTER — Ambulatory Visit: Payer: 59 | Admitting: Family Medicine

## 2015-08-26 ENCOUNTER — Other Ambulatory Visit: Payer: Self-pay | Admitting: Physician Assistant

## 2015-10-01 ENCOUNTER — Other Ambulatory Visit: Payer: Self-pay | Admitting: Family Medicine

## 2015-10-11 ENCOUNTER — Telehealth: Payer: Self-pay

## 2015-10-11 NOTE — Telephone Encounter (Signed)
Pt is needing to change the pharmacy to [facility] on [address]  Pt states she called in a request for generic for plavix last week but nothing has been done   [facility] number

## 2015-10-13 MED ORDER — CLOPIDOGREL BISULFATE 75 MG PO TABS: ORAL_TABLET | ORAL | Status: DC

## 2015-10-13 NOTE — Telephone Encounter (Signed)
Call --- patient is due for six month follow-up; I sent in a one month supply of medication for her.  She needs to be seen within upcoming month.

## 2015-10-14 NOTE — Telephone Encounter (Signed)
Called pt and advised message from provider on their voicemail.  

## 2015-10-18 ENCOUNTER — Telehealth: Payer: Self-pay | Admitting: Family Medicine

## 2015-10-18 ENCOUNTER — Telehealth: Payer: Self-pay

## 2015-10-18 NOTE — Telephone Encounter (Signed)
Left a message for patient to return call.  Need to find out if she has had a colonoscopy and if so where and when?  If not we need to ask her if it is ok if we order one for her?  Also need to find out if she has had an eye exam within the last year/

## 2015-10-21 ENCOUNTER — Telehealth: Payer: Self-pay

## 2015-10-21 MED ORDER — CARVEDILOL 12.5 MG PO TABS: 12.5000 mg | ORAL_TABLET | Freq: Two times a day (BID) | ORAL | Status: DC

## 2015-10-21 NOTE — Telephone Encounter (Signed)
Rx sent, pharmacy changed. SHe has an appt on 1/27 she can get new prescriptions from Dr. Katrinka Blazing.

## 2015-10-21 NOTE — Telephone Encounter (Signed)
Pt has changed her pharmacy to [facility] and [address] -she would like all medications on file be switched and she is also needing the carvedilol refilled  [facility] [phone]

## 2015-10-25 ENCOUNTER — Ambulatory Visit (INDEPENDENT_AMBULATORY_CARE_PROVIDER_SITE_OTHER): Payer: BLUE CROSS/BLUE SHIELD | Admitting: Family Medicine

## 2015-10-25 ENCOUNTER — Encounter: Payer: Self-pay | Admitting: Family Medicine

## 2015-10-25 VITALS — BP 131/75 | HR 79 | Temp 98.3°F | Resp 16 | Ht 67.5 in | Wt 201.2 lb

## 2015-10-25 DIAGNOSIS — Z1159 Encounter for screening for other viral diseases: Secondary | ICD-10-CM | POA: Diagnosis not present

## 2015-10-25 DIAGNOSIS — E039 Hypothyroidism, unspecified: Secondary | ICD-10-CM

## 2015-10-25 DIAGNOSIS — E119 Type 2 diabetes mellitus without complications: Secondary | ICD-10-CM

## 2015-10-25 DIAGNOSIS — E038 Other specified hypothyroidism: Secondary | ICD-10-CM

## 2015-10-25 DIAGNOSIS — I10 Essential (primary) hypertension: Secondary | ICD-10-CM

## 2015-10-25 DIAGNOSIS — E7800 Pure hypercholesterolemia, unspecified: Secondary | ICD-10-CM | POA: Diagnosis not present

## 2015-10-25 DIAGNOSIS — Z23 Encounter for immunization: Secondary | ICD-10-CM

## 2015-10-25 DIAGNOSIS — Z114 Encounter for screening for human immunodeficiency virus [HIV]: Secondary | ICD-10-CM

## 2015-10-25 DIAGNOSIS — M797 Fibromyalgia: Secondary | ICD-10-CM | POA: Diagnosis not present

## 2015-10-25 DIAGNOSIS — F329 Major depressive disorder, single episode, unspecified: Secondary | ICD-10-CM | POA: Diagnosis not present

## 2015-10-25 DIAGNOSIS — E034 Atrophy of thyroid (acquired): Secondary | ICD-10-CM

## 2015-10-25 DIAGNOSIS — F32A Depression, unspecified: Secondary | ICD-10-CM

## 2015-10-25 DIAGNOSIS — Z1211 Encounter for screening for malignant neoplasm of colon: Secondary | ICD-10-CM | POA: Diagnosis not present

## 2015-10-25 LAB — CBC WITH DIFFERENTIAL/PLATELET
BASOS ABS: 0.1 10*3/uL (ref 0.0–0.1)
Basophils Relative: 1 % (ref 0–1)
Eosinophils Absolute: 0.2 10*3/uL (ref 0.0–0.7)
Eosinophils Relative: 2 % (ref 0–5)
HEMATOCRIT: 39.2 % (ref 36.0–46.0)
HEMOGLOBIN: 13.7 g/dL (ref 12.0–15.0)
Lymphocytes Relative: 38 % (ref 12–46)
Lymphs Abs: 3.9 10*3/uL (ref 0.7–4.0)
MCH: 30.8 pg (ref 26.0–34.0)
MCHC: 34.9 g/dL (ref 30.0–36.0)
MCV: 88.1 fL (ref 78.0–100.0)
MONO ABS: 0.5 10*3/uL (ref 0.1–1.0)
MPV: 8.6 fL (ref 8.6–12.4)
Monocytes Relative: 5 % (ref 3–12)
NEUTROS ABS: 5.6 10*3/uL (ref 1.7–7.7)
NEUTROS PCT: 54 % (ref 43–77)
PLATELETS: 271 10*3/uL (ref 150–400)
RBC: 4.45 MIL/uL (ref 3.87–5.11)
RDW: 13.2 % (ref 11.5–15.5)
WBC: 10.3 10*3/uL (ref 4.0–10.5)

## 2015-10-25 LAB — LIPID PANEL
Cholesterol: 154 mg/dL (ref 125–200)
HDL: 36 mg/dL — ABNORMAL LOW (ref >=46)
LDL CALC: 51 mg/dL (ref <130)
TRIGLYCERIDES: 336 mg/dL — AB (ref <150)
Total CHOL/HDL Ratio: 4.3 Ratio (ref <=5.0)
VLDL: 67 mg/dL — ABNORMAL HIGH (ref <30)

## 2015-10-25 LAB — COMPREHENSIVE METABOLIC PANEL
ALBUMIN: 4 g/dL (ref 3.6–5.1)
ALK PHOS: 101 U/L (ref 33–130)
ALT: 8 U/L (ref 6–29)
AST: 17 U/L (ref 10–35)
BUN: 7 mg/dL (ref 7–25)
CALCIUM: 9 mg/dL (ref 8.6–10.4)
CO2: 27 mmol/L (ref 20–31)
Chloride: 97 mmol/L — ABNORMAL LOW (ref 98–110)
Creat: 0.77 mg/dL (ref 0.50–0.99)
Glucose, Bld: 149 mg/dL — ABNORMAL HIGH (ref 65–99)
POTASSIUM: 4.3 mmol/L (ref 3.5–5.3)
Sodium: 137 mmol/L (ref 135–146)
TOTAL PROTEIN: 6.9 g/dL (ref 6.1–8.1)
Total Bilirubin: 0.4 mg/dL (ref 0.2–1.2)

## 2015-10-25 LAB — HEPATITIS C ANTIBODY: HCV AB: NEGATIVE

## 2015-10-25 LAB — HEMOGLOBIN A1C
HEMOGLOBIN A1C: 7.6 % — AB (ref <5.7)
MEAN PLASMA GLUCOSE: 171 mg/dL — AB (ref <117)

## 2015-10-25 LAB — TSH: TSH: 0.768 u[IU]/mL (ref 0.350–4.500)

## 2015-10-25 LAB — HIV ANTIBODY (ROUTINE TESTING W REFLEX): HIV 1&2 Ab, 4th Generation: NONREACTIVE

## 2015-10-25 MED ORDER — CYCLOBENZAPRINE HCL 5 MG PO TABS: 5.0000 mg | ORAL_TABLET | Freq: Three times a day (TID) | ORAL | Status: DC | PRN

## 2015-10-25 MED ORDER — CLOPIDOGREL BISULFATE 75 MG PO TABS: 75.0000 mg | ORAL_TABLET | Freq: Once | ORAL | Status: DC

## 2015-10-25 MED ORDER — HYDROCHLOROTHIAZIDE 25 MG PO TABS: 25.0000 mg | ORAL_TABLET | Freq: Every day | ORAL | Status: DC

## 2015-10-25 MED ORDER — CARVEDILOL 12.5 MG PO TABS
12.5000 mg | ORAL_TABLET | Freq: Two times a day (BID) | ORAL | Status: DC
Start: 2015-10-25 — End: 2015-12-27

## 2015-10-25 MED ORDER — CITALOPRAM HYDROBROMIDE 40 MG PO TABS: 40.0000 mg | ORAL_TABLET | Freq: Every day | ORAL | Status: DC

## 2015-10-25 MED ORDER — LEVOTHYROXINE SODIUM 75 MCG PO TABS: 37.5000 ug | ORAL_TABLET | Freq: Every day | ORAL | Status: DC

## 2015-10-25 MED ORDER — GABAPENTIN 300 MG PO CAPS: ORAL_CAPSULE | ORAL | Status: DC

## 2015-10-25 MED ORDER — ENALAPRIL MALEATE 10 MG PO TABS: ORAL_TABLET | ORAL | Status: DC

## 2015-10-25 MED ORDER — ZOSTER VACCINE LIVE 19400 UNT/0.65ML ~~LOC~~ SOLR: 0.6500 mL | Freq: Once | SUBCUTANEOUS | Status: DC

## 2015-10-25 MED ORDER — METFORMIN HCL 1000 MG PO TABS: 1000.0000 mg | ORAL_TABLET | Freq: Two times a day (BID) | ORAL | Status: DC

## 2015-10-25 MED ORDER — SIMVASTATIN 40 MG PO TABS: 40.0000 mg | ORAL_TABLET | Freq: Every day | ORAL | Status: DC

## 2015-10-25 MED ORDER — HYDROCODONE-ACETAMINOPHEN 5-325 MG PO TABS: 1.0000 | ORAL_TABLET | Freq: Two times a day (BID) | ORAL | Status: DC | PRN

## 2015-10-25 NOTE — Progress Notes (Signed)
Subjective:    Patient ID: Anna Noble, female    DOB: [birth year], 60 y.o.   MRN: [id number]  10/25/2015  Follow-up and Medication Refill   HPI This 60 y.o. female presents for six month follow-up:  1. DMII:  Patient reports good compliance with medication, good tolerance to medication, and good symptom control.  Sugars running fairly good.  Fastings 112-200 which is rare.  For the past four months, has been only person bringing in money; husband just received disability; now ready to schedule eye exam.  Just received first disability check.     Review of Systems  Constitutional: Negative for fever, chills, diaphoresis and fatigue.  Eyes: Negative for visual disturbance.  Respiratory: Negative for cough and shortness of breath.   Cardiovascular: Negative for chest pain, palpitations and leg swelling.  Gastrointestinal: Negative for nausea, vomiting, abdominal pain, diarrhea and constipation.  Endocrine: Negative for cold intolerance, heat intolerance, polydipsia, polyphagia and polyuria.  Neurological: Negative for dizziness, tremors, seizures, syncope, facial asymmetry, speech difficulty, weakness, light-headedness, numbness and headaches.    Past Medical History  Diagnosis Date  . Coronary atherosclerosis of native coronary artery   . Type II or unspecified type diabetes mellitus without mention of complication, not stated as uncontrolled   . Other and unspecified hyperlipidemia   . Unspecified essential hypertension   . Dysmetabolic syndrome X   . Acute myocardial infarction of other anterior wall, episode of care unspecified   . Obesity, unspecified   . Tobacco use disorder   . Depression   . Thyroid disease   . Allergy   . Arthritis   . Neuromuscular disorder [facility])    Past Surgical History  Procedure Laterality Date  . Tendinitis surgery    . Knee arthroscopy    . Hand surgery    . Eye surgery      Strabismus L eye  . Toe surgery    . Cardiac catheterization      . Cardiac stenting    . Foot surgery      Achille's repair  . Tubal ligation     Allergies  Allergen Reactions  . Aspirin   . Latex   . Nsaids   . Penicillins   . Sulfonamide Derivatives    Current Outpatient Prescriptions  Medication Sig Dispense Refill  . carvedilol (COREG) 12.5 MG tablet Take 1 tablet (12.5 mg total) by mouth 2 (two) times daily. 60 tablet 0  . citalopram (CELEXA) 40 MG tablet Take 1 tablet (40 mg total) by mouth daily. 30 tablet 5  . clopidogrel (PLAVIX) 75 MG tablet Take 1 tablet (75 mg total) by mouth once. 30 tablet 11  . cyclobenzaprine (FLEXERIL) 5 MG tablet Take 1 tablet (5 mg total) by mouth 3 (three) times daily as needed. 90 tablet 5  . enalapril (VASOTEC) 10 MG tablet TAKE ONE TABLET BY MOUTH TWICE DAILY 60 tablet 11  . gabapentin (NEURONTIN) 300 MG capsule Take one capsule by mouth in the morning, one by mouth in the afternoon, and two at bedtime 120 capsule 5  . hydrochlorothiazide (HYDRODIURIL) 25 MG tablet Take 1 tablet (25 mg total) by mouth daily. 30 tablet 5  . HYDROcodone-acetaminophen (NORCO/VICODIN) 5-325 MG tablet Take 1 tablet by mouth 2 (two) times daily as needed for moderate pain. 30 tablet 0  . levothyroxine (SYNTHROID, LEVOTHROID) 75 MCG tablet Take 0.5 tablets (37.5 mcg total) by mouth daily. 30 tablet 5  . metFORMIN (GLUCOPHAGE) 1000 MG tablet Take 1 tablet (1,000  mg total) by mouth 2 (two) times daily with a meal. 60 tablet 5  . nitroGLYCERIN (NITROSTAT) 0.4 MG SL tablet Place 1 tablet (0.4 mg total) under the tongue every 5 (five) minutes as needed. 25 tablet 11  . simvastatin (ZOCOR) 40 MG tablet Take 1 tablet (40 mg total) by mouth at bedtime. 30 tablet 5  . zoster vaccine live, PF, (ZOSTAVAX) 38250 UNT/0.65ML injection Inject 19,400 Units into the skin once. 1 each 0   No current facility-administered medications for this visit.   Social History   Social History  . Marital Status: Divorced    Spouse Name: N/A  . Number of  Children: 2  . Years of Education: N/A   Occupational History  . 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 Smoking status: Former Smoker -- 0.50 packs/day for 20 years    Types: Cigarettes    Quit date: 12/18/2013  . Smokeless tobacco: Never Used  . Alcohol Use: 1.2 oz/week    2 Cans of beer per week     Comment: twice a week.  . Drug Use: No  . Sexual Activity: Yes    Birth Control/ Protection: None   Other Topics Concern  . Not on file   Social History Narrative   Marital status: Divorced after 30 years of marriage in 2007.  Dating same gentleman x 5 years.   Children: 2 children, no grandchildren.   Living: with boyfriend.   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   Tobacco: 1/2 ppd x 15 years.   Alcohol: beer socially.   Drugs: none   Exercise: walking three days per week   Family History  Problem Relation Age of Onset  . Coronary artery disease    . Down syndrome      child  . Dementia Mother   . Hypertension Mother   . Diabetes Mother   . Depression Father   . Heart disease Father 51    AMI age 43  . Hypertension Brother   . Arthritis Brother        Objective:    BP 131/75 mmHg  Pulse 79  Temp(Src) 98.3 F (36.8 C) (Oral)  Resp 16  Ht 5' 7.5" (1.715 m)  Wt 201 lb 3.2 oz (91.264 kg)  BMI 31.03 kg/m2  SpO2 98% Physical Exam  Constitutional: She is oriented to person, place, and time. She appears well-developed and well-nourished. No distress.  HENT:  Head: Normocephalic and atraumatic.  Right Ear: External ear normal.  Left Ear: External ear normal.  Nose: Nose normal.  Mouth/Throat: Oropharynx is clear and moist.  Eyes: Conjunctivae and EOM are normal. Pupils are equal, round, and reactive to light.  Neck: Normal range of motion. Neck supple. Carotid bruit is not present. No thyromegaly present.  Cardiovascular: Normal rate, regular rhythm, normal heart sounds and intact distal  pulses.  Exam reveals no gallop and no friction rub.   No murmur heard. Pulmonary/Chest: Effort normal and breath sounds normal. She has no wheezes. She has no rales.  Abdominal: Soft. Bowel sounds are normal. She exhibits no distension and no mass. There is no tenderness. There is no rebound and no guarding.  Lymphadenopathy:    She has no cervical adenopathy.  Neurological: She is alert and oriented to person, place, and time. No cranial nerve deficit.  Skin: Skin is warm and dry. No rash noted. She is not  diaphoretic. No erythema. No pallor.  Psychiatric: She has a normal mood and affect. Her behavior is normal.   Results for orders placed or performed in visit on 04/17/15  CBC with Differential/Platelet  Result Value Ref Range   WBC 10.0 4.0 - 10.5 K/uL   RBC 4.21 3.87 - 5.11 MIL/uL   Hemoglobin 12.5 12.0 - 15.0 g/dL   HCT 37.1 36.0 - 46.0 %   MCV 88.1 78.0 - 100.0 fL   MCH 29.7 26.0 - 34.0 pg   MCHC 33.7 30.0 - 36.0 g/dL   RDW 13.4 11.5 - 15.5 %   Platelets 225 150 - 400 K/uL   MPV 9.2 8.6 - 12.4 fL   Neutrophils Relative % 65 43 - 77 %   Neutro Abs 6.5 1.7 - 7.7 K/uL   Lymphocytes Relative 28 12 - 46 %   Lymphs Abs 2.8 0.7 - 4.0 K/uL   Monocytes Relative 5 3 - 12 %   Monocytes Absolute 0.5 0.1 - 1.0 K/uL   Eosinophils Relative 2 0 - 5 %   Eosinophils Absolute 0.2 0.0 - 0.7 K/uL   Basophils Relative 0 0 - 1 %   Basophils Absolute 0.0 0.0 - 0.1 K/uL   Smear Review Criteria for review not met   Comprehensive metabolic panel  Result Value Ref Range   Sodium 140 135 - 145 mEq/L   Potassium 4.2 3.5 - 5.3 mEq/L   Chloride 102 96 - 112 mEq/L   CO2 27 19 - 32 mEq/L   Glucose, Bld 139 (H) 70 - 99 mg/dL   BUN 7 6 - 23 mg/dL   Creat 0.67 0.50 - 1.10 mg/dL   Total Bilirubin 0.4 0.2 - 1.2 mg/dL   Alkaline Phosphatase 84 39 - 117 U/L   AST 15 0 - 37 U/L   ALT 8 0 - 35 U/L   Total Protein 6.6 6.0 - 8.3 g/dL   Albumin 3.9 3.5 - 5.2 g/dL   Calcium 9.0 8.4 - 10.5 mg/dL  Lipid  panel  Result Value Ref Range   Cholesterol 149 0 - 200 mg/dL   Triglycerides 318 (H) <150 mg/dL   HDL 41 (L) >=46 mg/dL   Total CHOL/HDL Ratio 3.6 Ratio   VLDL 64 (H) 0 - 40 mg/dL   LDL Cholesterol 44 0 - 99 mg/dL  POCT glycosylated hemoglobin (Hb A1C)  Result Value Ref Range   Hemoglobin A1C 7.3   POCT glucose (manual entry)  Result Value Ref Range   POC Glucose 134 (A) 70 - 99 mg/dl       Assessment & Plan:   1. Type 2 diabetes mellitus without complication, without long-term current use of insulin ([place])   2. Need for prophylactic vaccination and inoculation against influenza   3. Essential hypertension   4. Fibromyalgia   5. Depression   6. Pure hypercholesterolemia   7. Screening for HIV (human immunodeficiency virus)   8. Need for hepatitis C screening test   9. Hypothyroidism due to acquired atrophy of thyroid   10. Need for prophylactic vaccination against Streptococcus pneumoniae (pneumococcus)   11. Colon cancer screening   12. Hypothyroidism, unspecified hypothyroidism type   13. Essential hypertension, benign     Orders Placed This Encounter  Procedures  . Flu Vaccine QUAD 36+ mos IM  . Pneumococcal polysaccharide vaccine 23-valent greater than or equal to 2yo subcutaneous/IM  . CBC with Differential/Platelet  . Comprehensive metabolic panel    Order Specific Question:  Has  the patient fasted?    Answer:  Yes  . Hemoglobin A1c  . Lipid panel    Order Specific Question:  Has the patient fasted?    Answer:  Yes  . TSH  . HIV antibody  . Hepatitis C antibody  . Ambulatory referral to Gastroenterology    Referral Priority:  Routine    Referral Type:  Consultation    Referral Reason:  Specialty Services Required    Number of Visits Requested:  1   Meds ordered this encounter  Medications  . zoster vaccine live, PF, (ZOSTAVAX) 95638 UNT/0.65ML injection    Sig: Inject 19,400 Units into the skin once.    Dispense:  1 each    Refill:  0  . simvastatin  (ZOCOR) 40 MG tablet    Sig: Take 1 tablet (40 mg total) by mouth at bedtime.    Dispense:  30 tablet    Refill:  5  . metFORMIN (GLUCOPHAGE) 1000 MG tablet    Sig: Take 1 tablet (1,000 mg total) by mouth 2 (two) times daily with a meal.    Dispense:  60 tablet    Refill:  5  . levothyroxine (SYNTHROID, LEVOTHROID) 75 MCG tablet    Sig: Take 0.5 tablets (37.5 mcg total) by mouth daily.    Dispense:  30 tablet    Refill:  5  . HYDROcodone-acetaminophen (NORCO/VICODIN) 5-325 MG tablet    Sig: Take 1 tablet by mouth 2 (two) times daily as needed for moderate pain.    Dispense:  30 tablet    Refill:  0  . hydrochlorothiazide (HYDRODIURIL) 25 MG tablet    Sig: Take 1 tablet (25 mg total) by mouth daily.    Dispense:  30 tablet    Refill:  5  . gabapentin (NEURONTIN) 300 MG capsule    Sig: Take one capsule by mouth in the morning, one by mouth in the afternoon, and two at bedtime    Dispense:  120 capsule    Refill:  5  . enalapril (VASOTEC) 10 MG tablet    Sig: TAKE ONE TABLET BY MOUTH TWICE DAILY    Dispense:  60 tablet    Refill:  11  . cyclobenzaprine (FLEXERIL) 5 MG tablet    Sig: Take 1 tablet (5 mg total) by mouth 3 (three) times daily as needed.    Dispense:  90 tablet    Refill:  5  . clopidogrel (PLAVIX) 75 MG tablet    Sig: Take 1 tablet (75 mg total) by mouth once.    Dispense:  30 tablet    Refill:  11  . citalopram (CELEXA) 40 MG tablet    Sig: Take 1 tablet (40 mg total) by mouth daily.    Dispense:  30 tablet    Refill:  5  . carvedilol (COREG) 12.5 MG tablet    Sig: Take 1 tablet (12.5 mg total) by mouth 2 (two) times daily.    Dispense:  60 tablet    Refill:  0    Return in about 6 months (around 04/23/2016) for complete physical examiniation.    Kaycie Pegues Elayne Guerin, M.D. [facility] [address] [place], [place]  [address] [phone] phone [phone] fax

## 2015-10-25 NOTE — Patient Instructions (Signed)
Colonoscopy A colonoscopy is an exam to look at the entire large intestine (colon). This exam can help find problems such as tumors, polyps, inflammation, and areas of bleeding. The exam takes about 1 hour.  LET YOUR HEALTH CARE PROVIDER KNOW ABOUT:   Any allergies you have.  All medicines you are taking, including vitamins, herbs, eye drops, creams, and over-the-counter medicines.  Previous problems you or members of your family have had with the use of anesthetics.  Any blood disorders you have.  Previous surgeries you have had.  Medical conditions you have. RISKS AND COMPLICATIONS  Generally, this is a safe procedure. However, as with any procedure, complications can occur. Possible complications include:  Bleeding.  Tearing or rupture of the colon wall.  Reaction to medicines given during the exam.  Infection (rare). BEFORE THE PROCEDURE   Ask your health care provider about changing or stopping your regular medicines.  You may be prescribed an oral bowel prep. This involves drinking a large amount of medicated liquid, starting the day before your procedure. The liquid will cause you to have multiple loose stools until your stool is almost clear or light green. This cleans out your colon in preparation for the procedure.  Do not eat or drink anything else once you have started the bowel prep, unless your health care provider tells you it is safe to do so.  Arrange for someone to drive you home after the procedure. PROCEDURE   You will be given medicine to help you relax (sedative).  You will lie on your side with your knees bent.  A long, flexible tube with a light and camera on the end (colonoscope) will be inserted through the rectum and into the colon. The camera sends video back to a computer screen as it moves through the colon. The colonoscope also releases carbon dioxide gas to inflate the colon. This helps your health care provider see the area better.  During  the exam, your health care provider may take a small tissue sample (biopsy) to be examined under a microscope if any abnormalities are found.  The exam is finished when the entire colon has been viewed. AFTER THE PROCEDURE   Do not drive for 24 hours after the exam.  You may have a small amount of blood in your stool.  You may pass moderate amounts of gas and have mild abdominal cramping or bloating. This is caused by the gas used to inflate your colon during the exam.  Ask when your test results will be ready and how you will get your results. Make sure you get your test results.   This information is not intended to replace advice given to you by your health care provider. Make sure you discuss any questions you have with your health care provider.   Document Released: 09/11/2000 Document Revised: 07/05/2013 Document Reviewed: 05/22/2013 Elsevier Interactive Patient Education 2016 Elsevier Inc.  

## 2015-10-29 ENCOUNTER — Encounter: Payer: Self-pay | Admitting: Gastroenterology

## 2015-12-04 ENCOUNTER — Telehealth: Payer: Self-pay

## 2015-12-04 NOTE — Telephone Encounter (Signed)
Pt is needing to get a refill on pain medication   Best number [phone]

## 2015-12-10 MED ORDER — HYDROCODONE-ACETAMINOPHEN 5-325 MG PO TABS: 1.0000 | ORAL_TABLET | Freq: Two times a day (BID) | ORAL | Status: DC | PRN

## 2015-12-10 NOTE — Telephone Encounter (Signed)
Call --- hydrocodone is ready for pick up at [facility].

## 2015-12-11 NOTE — Telephone Encounter (Signed)
LMOM that Rx is ready. 

## 2015-12-17 ENCOUNTER — Encounter: Payer: Self-pay | Admitting: Gastroenterology

## 2015-12-17 ENCOUNTER — Ambulatory Visit (INDEPENDENT_AMBULATORY_CARE_PROVIDER_SITE_OTHER): Payer: BLUE CROSS/BLUE SHIELD | Admitting: Gastroenterology

## 2015-12-17 VITALS — BP 126/60 | HR 80 | Ht 66.25 in | Wt 204.0 lb

## 2015-12-17 DIAGNOSIS — Z139 Encounter for screening, unspecified: Secondary | ICD-10-CM | POA: Diagnosis not present

## 2015-12-17 NOTE — Patient Instructions (Addendum)
One set of FOBT home testing.  If this shows any microscopic blood, then will likely recommend a colonoscopy.

## 2015-12-17 NOTE — Progress Notes (Signed)
HPI: This is a  very pleasant 61 year old woman     who was referred to me by Wardell Honour, MD  to evaluate  colon cancer screening, family history of colon cancer .    Chief complaint is family history of colon cancer  Never had colonoscopy or colon cancer screening  Maternal aunt with colon cancer at 46 no first degree relatives with colon cancer;   No GI symptoms of concern for her.  No overt bleeding or recent changes.  CBC 1-2 months ago showed normal hemoglobin   Review of systems: Pertinent positive and negative review of systems were noted in the above HPI section. Complete review of systems was performed and was otherwise normal.   Past Medical History  Diagnosis Date  . Coronary atherosclerosis of native coronary artery   . DM (diabetes mellitus) ([place])   . HLD (hyperlipidemia)   . HTN (hypertension)   . Dysmetabolic syndrome X   . Acute myocardial infarction of other anterior wall, episode of care unspecified   . Obesity, unspecified   . Tobacco use disorder   . Depression   . Hypothyroidism   . Allergy   . Arthritis   . Neuromuscular disorder [facility])     Past Surgical History  Procedure Laterality Date  . Achilles tendon surgery Left   . Knee arthroscopy Right 1976  . Hand surgery Right     ganglion cyst, bone graft  . Strabismus surgery Left 1959  . Toe surgery Right     x 3  . Cardiac catheterization    . Cardiac stenting    . Tubal ligation      Current Outpatient Prescriptions  Medication Sig Dispense Refill  . carvedilol (COREG) 12.5 MG tablet Take 1 tablet (12.5 mg total) by mouth 2 (two) times daily. 60 tablet 0  . citalopram (CELEXA) 40 MG tablet Take 1 tablet (40 mg total) by mouth daily. 30 tablet 5  . clopidogrel (PLAVIX) 75 MG tablet Take 1 tablet (75 mg total) by mouth once. 30 tablet 11  . cyclobenzaprine (FLEXERIL) 5 MG tablet Take 1 tablet (5 mg total) by mouth 3 (three) times daily as needed. 90 tablet 5  . enalapril (VASOTEC) 10 MG  tablet TAKE ONE TABLET BY MOUTH TWICE DAILY 60 tablet 11  . gabapentin (NEURONTIN) 300 MG capsule Take one capsule by mouth in the morning, one by mouth in the afternoon, and two at bedtime 120 capsule 5  . hydrochlorothiazide (HYDRODIURIL) 25 MG tablet Take 1 tablet (25 mg total) by mouth daily. 30 tablet 5  . HYDROcodone-acetaminophen (NORCO/VICODIN) 5-325 MG tablet Take 1 tablet by mouth 2 (two) times daily as needed for moderate pain. 30 tablet 0  . levothyroxine (SYNTHROID, LEVOTHROID) 75 MCG tablet Take 0.5 tablets (37.5 mcg total) by mouth daily. 30 tablet 5  . metFORMIN (GLUCOPHAGE) 1000 MG tablet Take 1 tablet (1,000 mg total) by mouth 2 (two) times daily with a meal. 60 tablet 5  . nitroGLYCERIN (NITROSTAT) 0.4 MG SL tablet Place 1 tablet (0.4 mg total) under the tongue every 5 (five) minutes as needed. 25 tablet 11  . simvastatin (ZOCOR) 40 MG tablet Take 1 tablet (40 mg total) by mouth at bedtime. 30 tablet 5  . zoster vaccine live, PF, (ZOSTAVAX) 13086 UNT/0.65ML injection Inject 19,400 Units into the skin once. (Patient not taking: Reported on 12/17/2015) 1 each 0   No current facility-administered medications for this visit.    Allergies as of 12/17/2015 - Review  Complete 12/17/2015  Allergen Reaction Noted  . Aspirin    . Latex  04/02/2012  . Nsaids    . Penicillins    . Sulfonamide derivatives      Family History  Problem Relation Age of Onset  . Down syndrome Son   . Dementia Mother   . Hypertension Mother   . Diabetes Mother   . Depression Father   . Heart disease Father 15    AMI age 41  . Hypertension Brother   . Arthritis Brother   . Colon cancer Maternal Aunt 72  . Prostate cancer Father   . Diabetes Sister     Social History   Social History  . Marital Status: Divorced    Spouse Name: N/A  . Number of Children: 2  . Years of Education: N/A   Occupational History  . 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  Smoking status: Current Every Day Smoker -- 0.50 packs/day for 20 years    Types: Cigarettes  . Smokeless tobacco: Never Used  . Alcohol Use: 1.2 oz/week    2 Cans of beer per week     Comment: twice a week.  . Drug Use: No  . Sexual Activity: Yes    Birth Control/ Protection: None   Other Topics Concern  . Not on file   Social History Narrative   Marital status: Divorced after 30 years of marriage in 2007.  Dating same gentleman x 5 years.   Children: 2 children, no grandchildren.   Living: with boyfriend.   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   Tobacco: 1/2 ppd x 15 years.   Alcohol: beer socially.   Drugs: none   Exercise: walking three days per week     Physical Exam: BP 126/60 mmHg  Pulse 80  Ht 5' 6.25" (1.683 m)  Wt 204 lb (92.534 kg)  BMI 32.67 kg/m2 Constitutional: generally well-appearing Psychiatric: alert and oriented x3 Eyes: extraocular movements intact Mouth: oral pharynx moist, no lesions Neck: supple no lymphadenopathy Cardiovascular: heart regular rate and rhythm Lungs: clear to auscultation bilaterally Abdomen: soft, nontender, nondistended, no obvious ascites, no peritoneal signs, normal bowel sounds Extremities: no lower extremity edema bilaterally Skin: no lesions on visible extremities   Assessment and plan: 60 y.o. female with  Routine risk for colon cancer  she was concerned about possible charges with colonoscopy. I explained to her that there is someone in the office here who can help answer insurance questions however we never really know what insurance company is going to ask you to have a until after the procedure and they have sent you a bill for it.  She absolutely says she cannot afford any significant out-of-pocket expense. She prefers to undergo colon cancer screening via a different method. We had a nice discussion about colon cancer screening in general and in the end she decided to opt for annual  Hemoccult testing and we will provide her with one set of home fecal occult blood testing kit which she will return. She does understand that if this shows microscopic blood I will likely recommend colonoscopy. If it is negative then she will need repeat screening with fecal occult blood testing in one year.   Owens Loffler, MD [facility] 12/17/2015, 9:17 AM  Cc: Wardell Honour, MD

## 2015-12-27 ENCOUNTER — Other Ambulatory Visit: Payer: Self-pay | Admitting: Family Medicine

## 2016-02-25 ENCOUNTER — Encounter: Payer: Self-pay | Admitting: Family Medicine

## 2016-02-25 ENCOUNTER — Ambulatory Visit (INDEPENDENT_AMBULATORY_CARE_PROVIDER_SITE_OTHER): Payer: BLUE CROSS/BLUE SHIELD | Admitting: Family Medicine

## 2016-02-25 VITALS — BP 126/74 | HR 68 | Temp 98.5°F | Resp 16 | Ht 67.0 in | Wt 206.6 lb

## 2016-02-25 DIAGNOSIS — I10 Essential (primary) hypertension: Secondary | ICD-10-CM | POA: Diagnosis not present

## 2016-02-25 DIAGNOSIS — F32A Depression, unspecified: Secondary | ICD-10-CM

## 2016-02-25 DIAGNOSIS — E039 Hypothyroidism, unspecified: Secondary | ICD-10-CM

## 2016-02-25 DIAGNOSIS — M545 Low back pain, unspecified: Secondary | ICD-10-CM

## 2016-02-25 DIAGNOSIS — F329 Major depressive disorder, single episode, unspecified: Secondary | ICD-10-CM | POA: Diagnosis not present

## 2016-02-25 DIAGNOSIS — M797 Fibromyalgia: Secondary | ICD-10-CM

## 2016-02-25 DIAGNOSIS — E119 Type 2 diabetes mellitus without complications: Secondary | ICD-10-CM | POA: Diagnosis not present

## 2016-02-25 DIAGNOSIS — E7800 Pure hypercholesterolemia, unspecified: Secondary | ICD-10-CM

## 2016-02-25 LAB — CBC WITH DIFFERENTIAL/PLATELET
BASOS PCT: 1 %
Basophils Absolute: 90 cells/uL (ref 0–200)
EOS ABS: 180 {cells}/uL (ref 15–500)
Eosinophils Relative: 2 %
HEMATOCRIT: 37.7 % (ref 35.0–45.0)
HEMOGLOBIN: 12.7 g/dL (ref 11.7–15.5)
LYMPHS ABS: 3690 {cells}/uL (ref 850–3900)
LYMPHS PCT: 41 %
MCH: 30 pg (ref 27.0–33.0)
MCHC: 33.7 g/dL (ref 32.0–36.0)
MCV: 89.1 fL (ref 80.0–100.0)
MONO ABS: 630 {cells}/uL (ref 200–950)
MPV: 8.8 fL (ref 7.5–12.5)
Monocytes Relative: 7 %
NEUTROS ABS: 4410 {cells}/uL (ref 1500–7800)
Neutrophils Relative %: 49 %
Platelets: 278 10*3/uL (ref 140–400)
RBC: 4.23 MIL/uL (ref 3.80–5.10)
RDW: 13.7 % (ref 11.0–15.0)
WBC: 9 10*3/uL (ref 3.8–10.8)

## 2016-02-25 LAB — COMPREHENSIVE METABOLIC PANEL
ALT: 8 U/L (ref 6–29)
AST: 16 U/L (ref 10–35)
Albumin: 4.1 g/dL (ref 3.6–5.1)
Alkaline Phosphatase: 100 U/L (ref 33–130)
BUN: 7 mg/dL (ref 7–25)
CALCIUM: 9.3 mg/dL (ref 8.6–10.4)
CHLORIDE: 96 mmol/L — AB (ref 98–110)
CO2: 30 mmol/L (ref 20–31)
CREATININE: 0.82 mg/dL (ref 0.50–0.99)
GLUCOSE: 134 mg/dL — AB (ref 65–99)
POTASSIUM: 4.8 mmol/L (ref 3.5–5.3)
Sodium: 134 mmol/L — ABNORMAL LOW (ref 135–146)
Total Bilirubin: 0.4 mg/dL (ref 0.2–1.2)
Total Protein: 6.9 g/dL (ref 6.1–8.1)

## 2016-02-25 LAB — GLUCOSE, POCT (MANUAL RESULT ENTRY): POC GLUCOSE: 135 mg/dL — AB (ref 70–99)

## 2016-02-25 LAB — LIPID PANEL
CHOL/HDL RATIO: 3.9 ratio (ref <=5.0)
Cholesterol: 159 mg/dL (ref 125–200)
HDL: 41 mg/dL — AB (ref >=46)
LDL CALC: 53 mg/dL (ref <130)
Triglycerides: 323 mg/dL — ABNORMAL HIGH (ref <150)
VLDL: 65 mg/dL — AB (ref <30)

## 2016-02-25 LAB — POCT GLYCOSYLATED HEMOGLOBIN (HGB A1C): HEMOGLOBIN A1C: 7.6

## 2016-02-25 MED ORDER — CYCLOBENZAPRINE HCL 5 MG PO TABS: 5.0000 mg | ORAL_TABLET | Freq: Three times a day (TID) | ORAL | Status: DC | PRN

## 2016-02-25 MED ORDER — HYDROCHLOROTHIAZIDE 25 MG PO TABS: 25.0000 mg | ORAL_TABLET | Freq: Every day | ORAL | Status: DC

## 2016-02-25 MED ORDER — HYDROCODONE-ACETAMINOPHEN 5-325 MG PO TABS: 1.0000 | ORAL_TABLET | Freq: Two times a day (BID) | ORAL | Status: DC | PRN

## 2016-02-25 MED ORDER — SIMVASTATIN 40 MG PO TABS: 40.0000 mg | ORAL_TABLET | Freq: Every day | ORAL | Status: DC

## 2016-02-25 MED ORDER — NITROGLYCERIN 0.4 MG SL SUBL: 0.4000 mg | SUBLINGUAL_TABLET | SUBLINGUAL | Status: DC | PRN

## 2016-02-25 MED ORDER — CLOPIDOGREL BISULFATE 75 MG PO TABS: 75.0000 mg | ORAL_TABLET | Freq: Once | ORAL | Status: DC

## 2016-02-25 MED ORDER — ENALAPRIL MALEATE 10 MG PO TABS: ORAL_TABLET | ORAL | Status: DC

## 2016-02-25 MED ORDER — LEVOTHYROXINE SODIUM 75 MCG PO TABS: 37.5000 ug | ORAL_TABLET | Freq: Every day | ORAL | Status: DC

## 2016-02-25 MED ORDER — METFORMIN HCL 1000 MG PO TABS: 1000.0000 mg | ORAL_TABLET | Freq: Two times a day (BID) | ORAL | Status: DC

## 2016-02-25 MED ORDER — CARVEDILOL 12.5 MG PO TABS: 12.5000 mg | ORAL_TABLET | Freq: Two times a day (BID) | ORAL | Status: DC

## 2016-02-25 MED ORDER — CITALOPRAM HYDROBROMIDE 40 MG PO TABS: 40.0000 mg | ORAL_TABLET | Freq: Every day | ORAL | Status: DC

## 2016-02-25 MED ORDER — GABAPENTIN 300 MG PO CAPS: ORAL_CAPSULE | ORAL | Status: DC

## 2016-02-25 NOTE — Patient Instructions (Signed)
   IF you received an x-ray today, you will receive an invoice from [facility]. Please contact [facility] at [phone] with questions or concerns regarding your invoice.   IF you received labwork today, you will receive an invoice from [facility]. Please contact [facility] at [phone] with questions or concerns regarding your invoice.   Our billing staff will not be able to assist you with questions regarding bills from these companies.  You will be contacted with the lab results as soon as they are available. The fastest way to get your results is to activate your My Chart account. Instructions are located on the last page of this paperwork. If you have not heard from us regarding the results in 2 weeks, please contact this office.     Basic Carbohydrate Counting for Diabetes Mellitus Carbohydrate counting is a method for keeping track of the amount of carbohydrates you eat. Eating carbohydrates naturally increases the level of sugar (glucose) in your blood, so it is important for you to know the amount that is okay for you to have in every meal. Carbohydrate counting helps keep the level of glucose in your blood within normal limits. The amount of carbohydrates allowed is different for every person. A dietitian can help you calculate the amount that is right for you. Once you know the amount of carbohydrates you can have, you can count the carbohydrates in the foods you want to eat. Carbohydrates are found in the following foods:  Grains, such as breads and cereals.  Dried beans and soy products.  Starchy vegetables, such as potatoes, peas, and corn.  Fruit and fruit juices.  Milk and yogurt.  Sweets and snack foods, such as cake, cookies, candy, chips, soft drinks, and fruit drinks. CARBOHYDRATE COUNTING There are two ways to count the carbohydrates in your food. You can use either of the methods or a combination of both. Reading  the "Nutrition Facts" on Packaged Food The "Nutrition Facts" is an area that is included on the labels of almost all packaged food and beverages in the [place]. It includes the serving size of that food or beverage and information about the nutrients in each serving of the food, including the grams (g) of carbohydrate per serving.  Decide the number of servings of this food or beverage that you will be able to eat or drink. Multiply that number of servings by the number of grams of carbohydrate that is listed on the label for that serving. The total will be the amount of carbohydrates you will be having when you eat or drink this food or beverage. Learning Standard Serving Sizes of Food When you eat food that is not packaged or does not include "Nutrition Facts" on the label, you need to measure the servings in order to count the amount of carbohydrates.A serving of most carbohydrate-rich foods contains about 15 g of carbohydrates. The following list includes serving sizes of carbohydrate-rich foods that provide 15 g ofcarbohydrate per serving:   1 slice of bread (1 oz) or 1 six-inch tortilla.    of a hamburger bun or English muffin.  4-6 crackers.   cup unsweetened dry cereal.    cup hot cereal.   cup rice or pasta.    cup mashed potatoes or  of a large baked potato.  1 cup fresh fruit or one small piece of fruit.    cup canned or frozen fruit or fruit juice.  1 cup milk.   cup   plain fat-free yogurt or yogurt sweetened with artificial sweeteners.   cup cooked dried beans or starchy vegetable, such as peas, corn, or potatoes.  Decide the number of standard-size servings that you will eat. Multiply that number of servings by 15 (the grams of carbohydrates in that serving). For example, if you eat 2 cups of strawberries, you will have eaten 2 servings and 30 g of carbohydrates (2 servings x 15 g = 30 g). For foods such as soups and casseroles, in which more than one  food is mixed in, you will need to count the carbohydrates in each food that is included. EXAMPLE OF CARBOHYDRATE COUNTING Sample Dinner  3 oz chicken breast.   cup of brown rice.   cup of corn.  1 cup milk.   1 cup strawberries with sugar-free whipped topping.  Carbohydrate Calculation Step 1: Identify the foods that contain carbohydrates:   Rice.   Corn.   Milk.   Strawberries. Step 2:Calculate the number of servings eaten of each:   2 servings of rice.   1 serving of corn.   1 serving of milk.   1 serving of strawberries. Step 3: Multiply each of those number of servings by 15 g:   2 servings of rice x 15 g = 30 g.   1 serving of corn x 15 g = 15 g.   1 serving of milk x 15 g = 15 g.   1 serving of strawberries x 15 g = 15 g. Step 4: Add together all of the amounts to find the total grams of carbohydrates eaten: 30 g + 15 g + 15 g + 15 g = 75 g.   This information is not intended to replace advice given to you by your health care provider. Make sure you discuss any questions you have with your health care provider.   Document Released: 09/14/2005 Document Revised: 10/05/2014 Document Reviewed: 08/11/2013 Elsevier Interactive Patient Education 2016 Elsevier Inc.  

## 2016-02-25 NOTE — Progress Notes (Signed)
Subjective:    Patient ID: Anna Noble, female    DOB: 06-Jan-1956, 60 y.o.   MRN: [id number]  02/25/2016  Follow-up and Medication Refill   HPI This 60 y.o. female presents for four month follow-up:  1.  DMII:  Patient reports good compliance with medication, good tolerance to medication, and good symptom control.   Sugar running 120 fasting.  Eating more salads and more fruit.    2.  HTN: Patient reports good compliance with medication, good tolerance to medication, and good symptom control.  Home BPs running 120s/60s-70s.    3.  Hyperlipidemia: Patient reports good compliance with medication, good tolerance to medication, and good symptom control.    4.  Colon cancer screening: s/p consultation with GI; provided with kit.  Cannot afford colonoscopy.    5.  Hypothyroidism: Patient reports good compliance with medication, good tolerance to medication, and good symptom control.    6.  Fibromyalgia: stable; there are 7 or 8 bad days.    7. Lower back pain: chronic lower back pain. Referred to orthopedics last 03/2015.  R knee osteoarthritis.  +R knee OA.  B hip pain.  Last ortho consultation 8-10 years ago.    Review of Systems  Constitutional: Negative for fever, chills, diaphoresis and fatigue.  Eyes: Negative for visual disturbance.  Respiratory: Negative for cough and shortness of breath.   Cardiovascular: Negative for chest pain, palpitations and leg swelling.  Gastrointestinal: Negative for nausea, vomiting, abdominal pain, diarrhea and constipation.  Endocrine: Negative for cold intolerance, heat intolerance, polydipsia, polyphagia and polyuria.  Musculoskeletal: Positive for myalgias, back pain, joint swelling, arthralgias and gait problem.  Neurological: Negative for dizziness, tremors, seizures, syncope, facial asymmetry, speech difficulty, weakness, light-headedness, numbness and headaches.    Past Medical History  Diagnosis Date  . Coronary atherosclerosis of native  coronary artery   . DM (diabetes mellitus) ([place])   . HLD (hyperlipidemia)   . HTN (hypertension)   . Dysmetabolic syndrome X   . Acute myocardial infarction of other anterior wall, episode of care unspecified   . Obesity, unspecified   . Tobacco use disorder   . Depression   . Hypothyroidism   . Allergy   . Arthritis   . Neuromuscular disorder [facility])    Past Surgical History  Procedure Laterality Date  . Achilles tendon surgery Left   . Knee arthroscopy Right 1976  . Hand surgery Right     ganglion cyst, bone graft  . Strabismus surgery Left 1959  . Toe surgery Right     x 3  . Cardiac catheterization    . Cardiac stenting    . Tubal ligation     Allergies  Allergen Reactions  . Aspirin   . Latex   . Nsaids   . Penicillins   . Sulfonamide Derivatives     Social History   Social History  . Marital Status: Divorced    Spouse Name: N/A  . Number of Children: 2  . Years of Education: N/A   Occupational History  . employed     Merchandiser, retail at [employer] History Main Topics  . Smoking status: Current Every Day Smoker -- 0.50 packs/day for 20 years    Types: Cigarettes  . Smokeless tobacco: Never Used  . Alcohol Use: 1.2 oz/week    2 Cans of beer per week     Comment: twice a week.  . Drug Use: No  . Sexual Activity: Yes  Birth Control/ Protection: None   Other Topics Concern  . Not on file   Social History Narrative   Marital status: Divorced after 30 years of marriage in 2007.  Dating same gentleman x 5 years.   Children: 2 children, no grandchildren.   Living: with boyfriend.   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   Tobacco: 1/2 ppd x 15 years.   Alcohol: beer socially.   Drugs: none   Exercise: walking three days per week   Family History  Problem Relation Age of Onset  . Down syndrome Son   . Dementia Mother   . Hypertension Mother   . Diabetes Mother   . Depression Father   . Heart disease  Father 61    AMI age 73  . Prostate cancer Father   . Hypertension Brother   . Arthritis Brother   . Colon cancer Maternal Aunt 64  . Diabetes Sister        Objective:    BP 126/74 mmHg  Pulse 68  Temp(Src) 98.5 F (36.9 C) (Oral)  Resp 16  Ht 5' 7"  (1.702 m)  Wt 206 lb 9.6 oz (93.713 kg)  BMI 32.35 kg/m2  SpO2 98% Physical Exam  Constitutional: She is oriented to person, place, and time. She appears well-developed and well-nourished. No distress.  HENT:  Head: Normocephalic and atraumatic.  Right Ear: External ear normal.  Left Ear: External ear normal.  Nose: Nose normal.  Mouth/Throat: Oropharynx is clear and moist.  Eyes: Conjunctivae and EOM are normal. Pupils are equal, round, and reactive to light.  Neck: Normal range of motion. Neck supple. Carotid bruit is not present. No thyromegaly present.  Cardiovascular: Normal rate, regular rhythm, normal heart sounds and intact distal pulses.  Exam reveals no gallop and no friction rub.   No murmur heard. Pulmonary/Chest: Effort normal and breath sounds normal. She has no wheezes. She has no rales.  Abdominal: Soft. Bowel sounds are normal. She exhibits no distension and no mass. There is no tenderness. There is no rebound and no guarding.  Lymphadenopathy:    She has no cervical adenopathy.  Neurological: She is alert and oriented to person, place, and time. No cranial nerve deficit.  Skin: Skin is warm and dry. No rash noted. She is not diaphoretic. No erythema. No pallor.  Psychiatric: She has a normal mood and affect. Her behavior is normal.        Assessment & Plan:   1. Type 2 diabetes mellitus without complication, without long-term current use of insulin ([place])   2. Pure hypercholesterolemia   3. Fibromyalgia   4. Depression   5. Essential hypertension   6. Bilateral low back pain without sciatica   7. Hypothyroidism, unspecified hypothyroidism type   8. Essential hypertension, benign     Orders Placed  This Encounter  Procedures  . Comprehensive metabolic panel    Order Specific Question:  Has the patient fasted?    Answer:  Yes  . CBC with Differential/Platelet  . Lipid panel    Order Specific Question:  Has the patient fasted?    Answer:  Yes  . POCT glucose (manual entry)  . POCT glycosylated hemoglobin (Hb A1C)  . HM Diabetes Foot Exam   Meds ordered this encounter  Medications  . simvastatin (ZOCOR) 40 MG tablet    Sig: Take 1 tablet (40 mg total) by mouth at bedtime.    Dispense:  30 tablet  Refill:  5  . nitroGLYCERIN (NITROSTAT) 0.4 MG SL tablet    Sig: Place 1 tablet (0.4 mg total) under the tongue every 5 (five) minutes as needed.    Dispense:  25 tablet    Refill:  11  . metFORMIN (GLUCOPHAGE) 1000 MG tablet    Sig: Take 1 tablet (1,000 mg total) by mouth 2 (two) times daily with a meal.    Dispense:  60 tablet    Refill:  5  . levothyroxine (SYNTHROID, LEVOTHROID) 75 MCG tablet    Sig: Take 0.5 tablets (37.5 mcg total) by mouth daily.    Dispense:  30 tablet    Refill:  5  . hydrochlorothiazide (HYDRODIURIL) 25 MG tablet    Sig: Take 1 tablet (25 mg total) by mouth daily.    Dispense:  30 tablet    Refill:  5  . HYDROcodone-acetaminophen (NORCO/VICODIN) 5-325 MG tablet    Sig: Take 1 tablet by mouth 2 (two) times daily as needed for moderate pain.    Dispense:  30 tablet    Refill:  0  . gabapentin (NEURONTIN) 300 MG capsule    Sig: Take one capsule by mouth in the morning, one by mouth in the afternoon, and two at bedtime    Dispense:  120 capsule    Refill:  5  . enalapril (VASOTEC) 10 MG tablet    Sig: TAKE ONE TABLET BY MOUTH TWICE DAILY    Dispense:  60 tablet    Refill:  11  . cyclobenzaprine (FLEXERIL) 5 MG tablet    Sig: Take 1 tablet (5 mg total) by mouth 3 (three) times daily as needed.    Dispense:  90 tablet    Refill:  5  . clopidogrel (PLAVIX) 75 MG tablet    Sig: Take 1 tablet (75 mg total) by mouth once.    Dispense:  30 tablet     Refill:  11  . citalopram (CELEXA) 40 MG tablet    Sig: Take 1 tablet (40 mg total) by mouth daily.    Dispense:  30 tablet    Refill:  5  . carvedilol (COREG) 12.5 MG tablet    Sig: Take 1 tablet (12.5 mg total) by mouth 2 (two) times daily.    Dispense:  60 tablet    Refill:  5    Return in about 3 months (around 05/27/2016) for complete physical examiniation.   Kristi Elayne Guerin, M.D. [facility] [address] [place], [place]  [phone] phone [phone] fax

## 2016-04-29 ENCOUNTER — Ambulatory Visit (INDEPENDENT_AMBULATORY_CARE_PROVIDER_SITE_OTHER): Payer: BLUE CROSS/BLUE SHIELD

## 2016-04-29 ENCOUNTER — Ambulatory Visit (INDEPENDENT_AMBULATORY_CARE_PROVIDER_SITE_OTHER): Payer: BLUE CROSS/BLUE SHIELD | Admitting: Family Medicine

## 2016-04-29 VITALS — BP 130/80 | HR 82 | Temp 98.3°F | Resp 17 | Ht 68.0 in | Wt 208.0 lb

## 2016-04-29 DIAGNOSIS — M25561 Pain in right knee: Secondary | ICD-10-CM | POA: Diagnosis not present

## 2016-04-29 DIAGNOSIS — M797 Fibromyalgia: Secondary | ICD-10-CM | POA: Diagnosis not present

## 2016-04-29 DIAGNOSIS — G8929 Other chronic pain: Secondary | ICD-10-CM

## 2016-04-29 DIAGNOSIS — I25119 Atherosclerotic heart disease of native coronary artery with unspecified angina pectoris: Secondary | ICD-10-CM

## 2016-04-29 DIAGNOSIS — E1159 Type 2 diabetes mellitus with other circulatory complications: Secondary | ICD-10-CM

## 2016-04-29 MED ORDER — GABAPENTIN 300 MG PO CAPS: ORAL_CAPSULE | ORAL | 5 refills | Status: DC

## 2016-04-29 NOTE — Patient Instructions (Addendum)
Orthopedics will contact you to schedule your appointment.   May take three 300 mg of Gabapentin at bedtime for knee pain.   IF you received an x-ray today, you will receive an invoice from [facility]. Please contact [facility] at [phone] with questions or concerns regarding your invoice.   IF you received labwork today, you will receive an invoice from [employer]. Please contact [facility] at [phone] with questions or concerns regarding your invoice.   Our billing staff will not be able to assist you with questions regarding bills from these companies.  You will be contacted with the lab results as soon as they are available. The fastest way to get your results is to activate your My Chart account. Instructions are located on the last page of this paperwork. If you have not heard from [place] regarding the results in 2 weeks, please contact this office.     Knee pai Knee Pain Knee pain is a very common symptom and can have many causes. Knee pain often goes away when you follow your health care provider's instructions for relieving pain and discomfort at home. However, knee pain can develop into a condition that needs treatment. Some conditions may include:  Arthritis caused by wear and tear (osteoarthritis).  Arthritis caused by swelling and irritation (rheumatoid arthritis or gout).  A cyst or growth in your knee.  An infection in your knee joint.  An injury that will not heal.  Damage, swelling, or irritation of the tissues that support your knee (torn ligaments or tendinitis). If your knee pain continues, additional tests may be ordered to diagnose your condition. Tests may include X-rays or other imaging studies of your knee. You may also need to have fluid removed from your knee. Treatment for ongoing knee pain depends on the cause, but treatment may include:  Medicines to relieve pain or swelling.  Steroid injections in your  knee.  Physical therapy.  Surgery. HOME CARE INSTRUCTIONS  Take medicines only as directed by your health care provider.  Rest your knee and keep it raised (elevated) while you are resting.  Do not do things that cause or worsen pain.  Avoid high-impact activities or exercises, such as running, jumping rope, or doing jumping jacks.  Apply ice to the knee area:  Put ice in a plastic bag.  Place a towel between your skin and the bag.  Leave the ice on for 20 minutes, 2-3 times a day.  Ask your health care provider if you should wear an elastic knee support.  Keep a pillow under your knee when you sleep.  Lose weight if you are overweight. Extra weight can put pressure on your knee.  Do not use any tobacco products, including cigarettes, chewing tobacco, or electronic cigarettes. If you need help quitting, ask your health care provider. Smoking may slow the healing of any bone and joint problems that you may have. SEEK MEDICAL CARE IF:  Your knee pain continues, changes, or gets worse.  You have a fever along with knee pain.  Your knee buckles or locks up.  Your knee becomes more swollen. SEEK IMMEDIATE MEDICAL CARE IF:   Your knee joint feels hot to the touch.  You have chest pain or trouble breathing.   This information is not intended to replace advice given to you by your health care provider. Make sure you discuss any questions you have with your health care provider.   Document Released: 07/12/2007 Document Revised: 10/05/2014 Document Reviewed:  04/30/2014 Elsevier Interactive Patient Education [employer]

## 2016-04-29 NOTE — Progress Notes (Signed)
Patient ID: Anna Noble, female    DOB: Nov 28, 1955, 60 y.o.   MRN: [id number]  PCP: Nilda Simmer, MD  Chief Complaint  Patient presents with  . Knee Pain    Right side     Subjective:   HPI  Presents for evaluation of chronic knee pain. Pain has been present intermittently for several years, recently has become worst to point of affecting ADLs. Works several hours per day and rates knee pain as 8/10. She has not seen orthopedics recent but reports in 1976 she had exploratory surgery her right knee and the cartilage was torn and removed. She had an MRI 13-15 years ago and was told that she has arthritis knee. She reports falling directly on her right knee  3-4 months ago contributes the increasing of painful symptoms to this incident. Describes pain as throbbing and aching. Tried heat, cold compresses, and currently take Gabapentin for pain. She reports some elief of pain with the higher dose of gabapentin in evening.  Review of Systems  Respiratory: Negative.   Cardiovascular: Negative.   Musculoskeletal: Positive for arthralgias and joint swelling.       See HPI       Patient Active Problem List   Diagnosis Date Noted  . Thyroid activity decreased 03/20/2014  . Fibromyalgia 03/20/2014  . Depression 03/20/2014  . TOBACCO USER 02/21/2010  . DIABETES MELLITUS, TYPE II 02/19/2010  . HYPERLIPIDEMIA-MIXED 02/19/2010  . METABOLIC SYNDROME X 02/19/2010  . Essential hypertension 02/19/2010  . MYOCARDIAL INFARCTION, ANTERIOR WALL 02/19/2010  . CAD, NATIVE VESSEL 02/19/2010     Prior to Admission medications   Medication Sig Start Date End Date Taking? Authorizing Provider  carvedilol (COREG) 12.5 MG tablet Take 1 tablet (12.5 mg total) by mouth 2 (two) times daily. 02/25/16  Yes Ethelda Chick, MD  citalopram (CELEXA) 40 MG tablet Take 1 tablet (40 mg total) by mouth daily. 02/25/16  Yes Ethelda Chick, MD  clopidogrel (PLAVIX) 75 MG tablet Take 1 tablet (75 mg total) by  mouth once. 02/25/16  Yes Ethelda Chick, MD  cyclobenzaprine (FLEXERIL) 5 MG tablet Take 1 tablet (5 mg total) by mouth 3 (three) times daily as needed. 02/25/16  Yes Ethelda Chick, MD  enalapril (VASOTEC) 10 MG tablet TAKE ONE TABLET BY MOUTH TWICE DAILY 02/25/16  Yes Ethelda Chick, MD  gabapentin (NEURONTIN) 300 MG capsule Take one capsule by mouth in the morning, one by mouth in the afternoon, and two at bedtime 02/25/16  Yes Ethelda Chick, MD  hydrochlorothiazide (HYDRODIURIL) 25 MG tablet Take 1 tablet (25 mg total) by mouth daily. 02/25/16  Yes Ethelda Chick, MD  HYDROcodone-acetaminophen (NORCO/VICODIN) 5-325 MG tablet Take 1 tablet by mouth 2 (two) times daily as needed for moderate pain. 02/25/16  Yes Ethelda Chick, MD  levothyroxine (SYNTHROID, LEVOTHROID) 75 MCG tablet Take 0.5 tablets (37.5 mcg total) by mouth daily. 02/25/16  Yes Ethelda Chick, MD  metFORMIN (GLUCOPHAGE) 1000 MG tablet Take 1 tablet (1,000 mg total) by mouth 2 (two) times daily with a meal. 02/25/16  Yes Ethelda Chick, MD  nitroGLYCERIN (NITROSTAT) 0.4 MG SL tablet Place 1 tablet (0.4 mg total) under the tongue every 5 (five) minutes as needed. 02/25/16  Yes Ethelda Chick, MD  simvastatin (ZOCOR) 40 MG tablet Take 1 tablet (40 mg total) by mouth at bedtime. 02/25/16  Yes Ethelda Chick, MD  zoster vaccine live, PF, (ZOSTAVAX) 40981 UNT/0.65ML injection Inject 19,400 Units  into the skin once. 10/25/15  Yes Ethelda Chick, MD     Allergies  Allergen Reactions  . Aspirin   . Latex   . Nsaids   . Penicillins   . Sulfonamide Derivatives        Objective:  Physical Exam  Constitutional: She is oriented to person, place, and time. She appears well-developed and well-nourished.  HENT:  Head: Normocephalic and atraumatic.  Right Ear: External ear normal.  Left Ear: External ear normal.  Eyes: Conjunctivae and EOM are normal. Pupils are equal, round, and reactive to light.  Neck: Normal range of motion. Neck supple.    Cardiovascular: Normal rate, regular rhythm, normal heart sounds and intact distal pulses.   Pulmonary/Chest: Effort normal and breath sounds normal.  Abdominal: Soft. Bowel sounds are normal.  Musculoskeletal: Normal range of motion.  Neurological: She is alert and oriented to person, place, and time. She has normal reflexes.  Skin: Skin is warm and dry.     .Dg Knee Complete 4 Views Right  Result Date: 04/29/2016 CLINICAL DATA:  60 year old presenting with intermittent chronic right knee pain. Prior right knee surgery in 1976 with removal of cartilage. Patient fell approximately 3-4 months ago. No recent injuries. EXAM: RIGHT KNEE - COMPLETE 4+ VIEW COMPARISON:  08/27/2004. FINDINGS: No evidence of acute, subacute or healed fractures. Moderate medial compartment joint space narrowing and associated hypertrophic spurring. Mild patellofemoral compartment joint space narrowing with spurring along the undersurface of the patella. Lateral compartment joint space well preserved. Possible mild osseous demineralization. No other intrinsic osseous abnormality. Moderate-sized joint effusion. IMPRESSION: 1. Moderate medial compartment and mild patellofemoral compartment osteoarthritis. 2. Moderate-sized joint effusion. Electronically Signed   By: Hulan Saas M.D.   On: 04/29/2016 11:55         Assessment & Plan:  .1. Chronic knee pain, right - DG Knee Complete 4 Views Right; Future - AMB referral to orthopedics - Increase night time dose of gabapentin (NEURONTIN) 300 MG capsule to 900 mg; -Recommended possible joint tapping to remove effusion. Patient will follow-up with an appointment with Dr. Neva Seat if she decides to have this done.  Godfrey Pick. Tiburcio Pea, MSN, FNP-C [facility]

## 2016-05-05 ENCOUNTER — Encounter: Payer: Self-pay | Admitting: Family Medicine

## 2016-05-05 ENCOUNTER — Ambulatory Visit (INDEPENDENT_AMBULATORY_CARE_PROVIDER_SITE_OTHER): Payer: BLUE CROSS/BLUE SHIELD | Admitting: Family Medicine

## 2016-05-05 VITALS — BP 126/82 | HR 82 | Temp 97.8°F | Resp 18 | Ht 68.0 in | Wt 205.0 lb

## 2016-05-05 DIAGNOSIS — I10 Essential (primary) hypertension: Secondary | ICD-10-CM | POA: Diagnosis not present

## 2016-05-05 DIAGNOSIS — Z124 Encounter for screening for malignant neoplasm of cervix: Secondary | ICD-10-CM

## 2016-05-05 DIAGNOSIS — E034 Atrophy of thyroid (acquired): Secondary | ICD-10-CM

## 2016-05-05 DIAGNOSIS — F329 Major depressive disorder, single episode, unspecified: Secondary | ICD-10-CM | POA: Diagnosis not present

## 2016-05-05 DIAGNOSIS — E038 Other specified hypothyroidism: Secondary | ICD-10-CM

## 2016-05-05 DIAGNOSIS — M797 Fibromyalgia: Secondary | ICD-10-CM | POA: Diagnosis not present

## 2016-05-05 DIAGNOSIS — E1159 Type 2 diabetes mellitus with other circulatory complications: Secondary | ICD-10-CM

## 2016-05-05 DIAGNOSIS — Z0000 Encounter for general adult medical examination without abnormal findings: Secondary | ICD-10-CM

## 2016-05-05 DIAGNOSIS — F17200 Nicotine dependence, unspecified, uncomplicated: Secondary | ICD-10-CM | POA: Diagnosis not present

## 2016-05-05 DIAGNOSIS — E8881 Metabolic syndrome: Secondary | ICD-10-CM

## 2016-05-05 DIAGNOSIS — F32A Depression, unspecified: Secondary | ICD-10-CM

## 2016-05-05 DIAGNOSIS — I25119 Atherosclerotic heart disease of native coronary artery with unspecified angina pectoris: Secondary | ICD-10-CM

## 2016-05-05 LAB — POCT URINALYSIS DIP (MANUAL ENTRY)
BILIRUBIN UA: NEGATIVE
BILIRUBIN UA: NEGATIVE
Glucose, UA: NEGATIVE
LEUKOCYTES UA: NEGATIVE
Nitrite, UA: NEGATIVE
Protein Ur, POC: NEGATIVE
Spec Grav, UA: 1.015
Urobilinogen, UA: 0.2
pH, UA: 7

## 2016-05-05 LAB — LIPID PANEL
CHOL/HDL RATIO: 4.1 ratio (ref <=5.0)
Cholesterol: 169 mg/dL (ref 125–200)
HDL: 41 mg/dL — ABNORMAL LOW (ref >=46)
LDL Cholesterol: 64 mg/dL (ref <130)
Triglycerides: 318 mg/dL — ABNORMAL HIGH (ref <150)
VLDL: 64 mg/dL — AB (ref <30)

## 2016-05-05 LAB — T4, FREE: FREE T4: 1.2 ng/dL (ref 0.8–1.8)

## 2016-05-05 LAB — COMPREHENSIVE METABOLIC PANEL
ALT: 9 U/L (ref 6–29)
AST: 25 U/L (ref 10–35)
Albumin: 4.2 g/dL (ref 3.6–5.1)
Alkaline Phosphatase: 97 U/L (ref 33–130)
BUN: 6 mg/dL — ABNORMAL LOW (ref 7–25)
CO2: 29 mmol/L (ref 20–31)
Calcium: 9.3 mg/dL (ref 8.6–10.4)
Chloride: 97 mmol/L — ABNORMAL LOW (ref 98–110)
Creat: 0.75 mg/dL (ref 0.50–0.99)
Glucose, Bld: 171 mg/dL — ABNORMAL HIGH (ref 65–99)
Potassium: 4.2 mmol/L (ref 3.5–5.3)
Sodium: 135 mmol/L (ref 135–146)
Total Bilirubin: 0.4 mg/dL (ref 0.2–1.2)
Total Protein: 7 g/dL (ref 6.1–8.1)

## 2016-05-05 LAB — POC MICROSCOPIC URINALYSIS (UMFC): Mucus: ABSENT

## 2016-05-05 LAB — TSH: TSH: 0.88 mIU/L

## 2016-05-05 LAB — MICROALBUMIN, URINE: MICROALB UR: 1 mg/dL

## 2016-05-05 NOTE — Patient Instructions (Addendum)
Please schedule your eye exam.  Please complete your stool studies.   IF you received an x-ray today, you will receive an [facility] from [facility]. Please contact [facility] at [phone] with questions or concerns regarding your invoice.   IF you received labwork today, you will receive an invoice from [facility]. Please contact [facility] at [phone] with questions or concerns regarding your invoice.   Our billing staff will not be able to assist you with questions regarding bills from these companies.  You will be contacted with the lab results as soon as they are available. The fastest way to get your results is to activate your My Chart account. Instructions are located on the last page of this paperwork. If you have not heard from us regarding the results in 2 weeks, please contact this office.    Keeping You Healthy  Get These Tests  Blood Pressure- Have your blood pressure checked by your healthcare provider at least once a year.  Normal blood pressure is 120/80.  Weight- Have your body mass index (BMI) calculated to screen for obesity.  BMI is a measure of body fat based on height and weight.  You can calculate your own BMI at [email or web address]://[email or web address]-[email or web address]  Cholesterol- Have your cholesterol checked every year.  Diabetes- Have your blood sugar checked every year if you have high blood pressure, high cholesterol, a family history of diabetes or if you are overweight.  Pap Test - Have a pap test every 1 to 5 years if you have been sexually active.  If you are older than 65 and recent pap tests have been normal you may not need additional pap tests.  In addition, if you have had a hysterectomy  for benign disease additional pap tests are not necessary.  Mammogram-Yearly mammograms are essential for early detection of breast cancer  Screening for Colon Cancer- Colonoscopy starting at age 61. Screening may begin sooner depending on  your family history and other health conditions.  Follow up colonoscopy as directed by your Gastroenterologist.  Screening for Osteoporosis- Screening begins at age 61 with bone density scanning, sooner if you are at higher risk for developing Osteoporosis.  Get these medicines  Calcium with Vitamin D- Your body requires 1200-1500 mg of Calcium a day and [phone] IU of Vitamin D a day.  You can only absorb 500 mg of Calcium at a time therefore Calcium must be taken in 2 or 3 separate doses throughout the day.  Hormones- Hormone therapy has been associated with increased risk for certain cancers and heart disease.  Talk to your healthcare provider about if you need relief from menopausal symptoms.  Aspirin- Ask your healthcare provider about taking Aspirin to prevent Heart Disease and Stroke.  Get these Immuniztions  Flu shot- Every fall  Pneumonia shot- Once after the age of 61; if you are younger ask your healthcare provider if you need a pneumonia shot.  Tetanus- Every ten years.  Zostavax- Once after the age of 61 to prevent shingles.  Take these steps  Don't smoke- Your healthcare provider can help you quit. For tips on how to quit, ask your healthcare provider or go to www.smokefree.gov or call 1-800 QUIT-NOW.  Be physically active- Exercise 5 days a week for a minimum of 30 minutes.  If you are not already physically active, start slow and gradually work up to 30 minutes of moderate physical activity.  Try walking, dancing, bike riding, swimming, etc.  Eat a healthy diet- Eat  a variety of healthy foods such as fruits, vegetables, whole grains, low fat milk, low fat cheeses, yogurt, lean meats, chicken, fish, eggs, dried beans, tofu, etc.  For more information go to www.thenutritionsource.org  Dental visit- Brush and floss teeth twice daily; visit your dentist twice a year.  Eye exam- Visit your Optometrist or Ophthalmologist yearly.  Drink alcohol in moderation- Limit alcohol  intake to one drink or less a day.  Never drink and drive.  Depression- Your emotional health is as important as your physical health.  If you're feeling down or losing interest in things you normally enjoy, please talk to your healthcare provider.  Seat Belts- can save your life; always wear one  Smoke/Carbon Monoxide detectors- These detectors need to be installed on the appropriate level of your home.  Replace batteries at least once a year.  Violence- If anyone is threatening or hurting you, please tell your healthcare provider.  Living Will/ Health care power of attorney- Discuss with your healthcare provider and family.

## 2016-05-05 NOTE — Progress Notes (Signed)
Patient ID: Anna Noble, female   DOB: 1956/01/18, 60 y.o.   MRN: [id number]   By signing my name below I, [name], attest that this documentation has been prepared under the direction and in the presence of [name]. Electonically Signed. [name], Scribe 05/25/2016 at 1:12 PM     Subjective:    Patient ID: Anna Noble, female    DOB: 06/07/1956, 59 y.o.   MRN: [id number]  05/05/2016  Annual Exam   HPI Anna Noble is a 60 y.o. female who presents to the [facility] for her annual physical.   Pt states that getting old is "pidgeon poop". Pt has 2 children. One has down syndrome. Pt is still working. Pt is still dating the same gentleman.   PT c/o that her new BP medication, hydrochlorothiazide, has caused her to have urinary incontinence whenever she sneezes, coughs, or laughs.  Pt reports that her hearing is "not good, especially with background noise"   Pt denies any tinnitus, HA, dizziness, HA, blurring vision, CP, SOB, palpitations.  Pt also denies any numbness, tingling in her feet. Pt reports rare and very mild episodes of feet swelling whenever she is on her feet all day.   Pt's last pap smear was in 2005. Last mammogram a year ago on 04/26/15. Pt has never had a colonoscopy. Pt just received the kit to do the stool sample test recommended by her GI physician.  Pt received her tetanus shot in 2016 tetanus. Shingles vaccine was in February 2017. Pt had her pneumonia and flu vaccine in Jan 2017.  Pt last saw her cardiologist 2 years ago. Pt states she is due for an eye exam and will schedule it herself.  Pt states that she has dentures, does not need a dentist  Pt denies having any surgeries this year.   PT smokes 1/2 to a pack a day. Pt also drinks 1-2 alcoholic drinks once a week.   Pt's mother died at age 38. Father died age 53. Pt's father had depression, MI at age 3, and multiple CABGs. Pt's sister has DM and had a mild MI a year  ago in her late 68s. Pt's sister also has history of HTN, MS, and fibromyalgia. Pt's brother has HTN and had a mild MI in his 16s. Pt's grandfather died in his 26s of an MI.    Pt states that she does not text and drive. Pt always wears her seatbelt.    Review of Systems  Constitutional: Negative for activity change, appetite change, chills, diaphoresis, fatigue, fever and unexpected weight change.  HENT: Negative for congestion, dental problem, drooling, ear discharge, ear pain, facial swelling, hearing loss, mouth sores, nosebleeds, postnasal drip, rhinorrhea, sinus pressure, sneezing, sore throat, tinnitus, trouble swallowing and voice change.   Eyes: Negative for photophobia, pain, discharge, redness, itching and visual disturbance.  Respiratory: Negative for apnea, cough, choking, chest tightness, shortness of breath, wheezing and stridor.   Cardiovascular: Negative for chest pain, palpitations and leg swelling.  Gastrointestinal: Negative for abdominal distention, abdominal pain, anal bleeding, blood in stool, constipation, diarrhea, nausea, rectal pain and vomiting.  Endocrine: Negative for cold intolerance, heat intolerance, polydipsia, polyphagia and polyuria.  Genitourinary: Negative for decreased urine volume, difficulty urinating, dyspareunia, dysuria, enuresis, flank pain, frequency, genital sores, hematuria, menstrual problem, pelvic pain, urgency, vaginal bleeding, vaginal discharge and vaginal pain.       Pt is positive for urinary incontinence when pt laughs hard,  coughs, or sneezes.  Musculoskeletal: Negative for arthralgias, back pain, gait problem, joint swelling, myalgias, neck pain and neck stiffness.  Skin: Negative for color change, pallor, rash and wound.  Allergic/Immunologic: Negative for environmental allergies, food allergies and immunocompromised state.  Neurological: Negative for dizziness, tremors, seizures, syncope, facial asymmetry, speech difficulty, weakness,  light-headedness, numbness and headaches.  Hematological: Negative for adenopathy. Does not bruise/bleed easily.  Psychiatric/Behavioral: Negative for agitation, behavioral problems, confusion, decreased concentration, dysphoric mood, hallucinations, self-injury, sleep disturbance and suicidal ideas. The patient is not nervous/anxious and is not hyperactive.     Past Medical History:  Diagnosis Date  . Acute myocardial infarction of other anterior wall, episode of care unspecified   . Allergy   . Arthritis   . Coronary atherosclerosis of native coronary artery   . Depression   . DM (diabetes mellitus) ([place])   . Dysmetabolic syndrome X   . HLD (hyperlipidemia)   . HTN (hypertension)   . Hypothyroidism   . Neuromuscular disorder ([place])   . Obesity, unspecified   . Tobacco use disorder    Past Surgical History:  Procedure Laterality Date  . ACHILLES TENDON SURGERY Left   . CARDIAC CATHETERIZATION    . Cardiac stenting    . EYE SURGERY    . HAND SURGERY Right    ganglion cyst, bone graft  . KNEE ARTHROSCOPY Right 1976  . STRABISMUS SURGERY Left 1959  . TOE SURGERY Right    x 3  . TUBAL LIGATION     Allergies  Allergen Reactions  . Aspirin   . Latex   . Nsaids   . Penicillins   . Sulfonamide Derivatives     Social History   Social History  . Marital status: Divorced    Spouse name: N/A  . Number of children: 2  . Years of education: N/A   Occupational History  . employed [employer] at [place] History Main Topics  . Smoking status: Current Every Day Smoker    Packs/day: 0.50    Years: 30.00    Types: Cigarettes  . Smokeless tobacco: Current User  . Alcohol use 1.2 oz/week    2 Cans of beer per week     Comment: twice a week.  . Drug use: No  . Sexual activity: Yes    Partners: Male    Birth control/ protection: None   Other Topics Concern  . Not on file   Social History Narrative   Marital status: Divorced  after 30 years of marriage in 2007.  Dating same gentleman/Frank x 8 years.   Children: 2 children, no grandchildren.  Oldest son with Down's syndrome.     Living: with boyfriend/Frank.   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   Tobacco: 1/2 ppd x 18 years.   Alcohol: 2 drinks on Thursday night/socially.   Drugs: none   Exercise: walking three days per week; work related.   Seatbelt: 100%; no texting      Family History  Problem Relation Age of Onset  . Dementia Mother   . Hypertension Mother   . Diabetes Mother   . Depression Father   . Heart disease Father 18    AMI age 26; CABG.  . Prostate cancer Father   . Hypertension Father   . Cancer Father     prostate cancer  . Hypertension Brother   . Arthritis Brother   .  Heart disease Brother 53    AMI  . Diabetes Sister   . Heart disease Sister 75    mild AMI  . Hypertension Sister   . Multiple sclerosis Sister   . Down syndrome Son   . Colon cancer Maternal Aunt 45       Objective:    BP 126/82 (BP Location: Right Arm, Patient Position: Sitting, Cuff Size: Small)   Pulse 82   Temp 97.8 F (36.6 C) (Oral)   Resp 18   Ht _0  (1.727 m)   Wt 205 lb (93 kg)   SpO2 98%   BMI 31.17 kg/m  Physical Exam  Constitutional: She is oriented to person, place, and time. She appears well-developed and well-nourished. No distress.  HENT:  Head: Normocephalic and atraumatic.  Right Ear: Hearing, tympanic membrane, external ear and ear canal normal.  Left Ear: Hearing, tympanic membrane, external ear and ear canal normal.  Nose: Nose normal.  Mouth/Throat: Oropharynx is clear and moist and mucous membranes are normal. No oropharyngeal exudate or posterior oropharyngeal erythema.  Eyes: Conjunctivae and EOM are normal. Pupils are equal, round, and reactive to light.  Neck: Normal range of motion and full passive range of motion without pain. Neck supple. No JVD present. Carotid bruit is not present.  No thyromegaly present.  Cardiovascular: Normal rate, regular rhythm and normal heart sounds.  Exam reveals no gallop and no friction rub.   No murmur heard. Pulmonary/Chest: Effort normal and breath sounds normal. No respiratory distress. She has no decreased breath sounds. She has no wheezes. She has no rales. Right breast exhibits no inverted nipple, no mass, no nipple discharge, no skin change and no tenderness. Left breast exhibits no inverted nipple, no mass, no nipple discharge, no skin change and no tenderness. Breasts are symmetrical.  Abdominal: Soft. Normal appearance and bowel sounds are normal. She exhibits no distension and no mass. There is no hepatosplenomegaly. There is no tenderness. There is no rigidity, no rebound, no guarding and no CVA tenderness. No hernia. Hernia confirmed negative in the right inguinal area and confirmed negative in the left inguinal area.  Genitourinary: Uterus normal. No breast swelling, tenderness, discharge or bleeding. There is no rash, tenderness, lesion or injury on the right labia. There is no rash, tenderness, lesion or injury on the left labia. Cervix exhibits no discharge and no friability. Right adnexum displays no mass, no tenderness and no fullness. Left adnexum displays no mass, no tenderness and no fullness. No erythema, tenderness or bleeding in the vagina. No foreign body in the vagina. No signs of injury around the vagina. No vaginal discharge found.  Musculoskeletal: Normal range of motion. She exhibits no edema, tenderness or deformity.       Right shoulder: Normal.       Left shoulder: Normal.       Cervical back: Normal.  Lymphadenopathy:    She has no cervical adenopathy.       Right: No inguinal adenopathy present.       Left: No inguinal adenopathy present.  Neurological: She is alert and oriented to person, place, and time. She has normal strength and normal reflexes. No cranial nerve deficit or sensory deficit. She exhibits normal  muscle tone. She displays a negative Romberg sign. Coordination and gait normal.  No pronator drift.   Skin: Skin is warm and dry. No rash noted. She is not diaphoretic. No erythema. No pallor.  Psychiatric: She has a normal mood and affect. Her  behavior is normal. Judgment and thought content normal.  Nursing note and vitals reviewed.  Depression screen [facility] 2/9 05/05/2016 04/29/2016 02/25/2016 10/25/2015 04/17/2015  Decreased Interest 0 0 1 1 0  Down, Depressed, Hopeless 0 0 0 1 0  PHQ - 2 Score 0 0 1 2 0  Altered sleeping - - - 0 -  Tired, decreased energy - - - 1 -  Change in appetite - - - 2 -  Feeling bad or failure about yourself  - - - 0 -  Trouble concentrating - - - 0 -  Moving slowly or fidgety/restless - - - 0 -  Suicidal thoughts - - - 0 -  PHQ-9 Score - - - 5 -        Assessment & Plan:   1. Routine physical examination   2. Atherosclerosis of native coronary artery of native heart with angina pectoris ([place])   3. Type 2 diabetes mellitus with other circulatory complication, without long-term current use of insulin ([place])   4. Hypothyroidism due to acquired atrophy of thyroid   5. Fibromyalgia   6. METABOLIC SYNDROME X   7. TOBACCO USER   8. Depression   9. Essential hypertension, benign   10. Cervical cancer screening    Pt was recommended to : Continue current medications, Schedule herself for an eye exam.  To perform the cologuard test provided by her GI physician.  Orders Placed This Encounter  Procedures  . CBC with Differential/Platelet  . Comprehensive metabolic panel    Order Specific Question:   Has the patient fasted?    Answer:   Yes  . Hemoglobin A1c  . TSH  . T4, free  . Lipid panel    Order Specific Question:   Has the patient fasted?    Answer:   Yes  . Microalbumin, urine  . POCT urinalysis dipstick  . POCT Microscopic Urinalysis ([facility])  . EKG 12-Lead   No orders of the defined types were placed in this encounter.   Return in about 4  months (around 09/04/2016) for recheck diabetes, high blood pressure, high cholesterol.   I personally performed the services described in this documentation, which was scribed in my presence. The recorded information has been reviewed and considered.  Kristi Elayne Guerin, M.D. [facility] [address] [place], [place]  [address] [phone] phone [phone] fax

## 2016-05-05 NOTE — Progress Notes (Signed)
   Subjective:    Patient ID: Anna Noble, female    DOB: [birth year], 60 y.o.   MRN: [id number]  HPI    Review of Systems  Constitutional: Negative.   HENT: Positive for hearing loss.   Eyes: Positive for visual disturbance.  Respiratory: Negative.   Cardiovascular: Negative.   Gastrointestinal: Negative.   Endocrine: Negative.   Genitourinary: Negative.   Allergic/Immunologic: Positive for environmental allergies.  Neurological: Negative.   Hematological: Negative.   Psychiatric/Behavioral: Negative.        Objective:   Physical Exam        Assessment & Plan:

## 2016-05-06 LAB — CBC WITH DIFFERENTIAL/PLATELET
BASOS PCT: 0 %
Basophils Absolute: 0 cells/uL (ref 0–200)
EOS ABS: 188 {cells}/uL (ref 15–500)
Eosinophils Relative: 2 %
HCT: 41.5 % (ref 35.0–45.0)
Hemoglobin: 14.1 g/dL (ref 11.7–15.5)
LYMPHS PCT: 32 %
Lymphs Abs: 3008 cells/uL (ref 850–3900)
MCH: 30.7 pg (ref 27.0–33.0)
MCHC: 34 g/dL (ref 32.0–36.0)
MCV: 90.4 fL (ref 80.0–100.0)
MONOS PCT: 5 %
MPV: 8.9 fL (ref 7.5–12.5)
Monocytes Absolute: 470 cells/uL (ref 200–950)
NEUTROS PCT: 61 %
Neutro Abs: 5734 cells/uL (ref 1500–7800)
PLATELETS: 292 10*3/uL (ref 140–400)
RBC: 4.59 MIL/uL (ref 3.80–5.10)
RDW: 13.1 % (ref 11.0–15.0)
WBC: 9.4 10*3/uL (ref 3.8–10.8)

## 2016-05-06 LAB — PAP IG, CT-NG NAA, HPV HIGH-RISK
Chlamydia Probe Amp: NOT DETECTED
GC PROBE AMP: NOT DETECTED
HPV DNA HIGH RISK: NOT DETECTED

## 2016-05-06 LAB — HEMOGLOBIN A1C
Hgb A1c MFr Bld: 7.5 % — ABNORMAL HIGH (ref <5.7)
Mean Plasma Glucose: 169 mg/dL

## 2016-07-29 ENCOUNTER — Encounter: Payer: Self-pay | Admitting: Family Medicine

## 2016-07-29 ENCOUNTER — Ambulatory Visit (INDEPENDENT_AMBULATORY_CARE_PROVIDER_SITE_OTHER): Payer: BLUE CROSS/BLUE SHIELD | Admitting: Family Medicine

## 2016-07-29 VITALS — BP 118/72 | HR 92 | Temp 98.1°F | Resp 18 | Ht 68.0 in | Wt 209.0 lb

## 2016-07-29 DIAGNOSIS — E1159 Type 2 diabetes mellitus with other circulatory complications: Secondary | ICD-10-CM | POA: Diagnosis not present

## 2016-07-29 DIAGNOSIS — M797 Fibromyalgia: Secondary | ICD-10-CM | POA: Diagnosis not present

## 2016-07-29 DIAGNOSIS — M1711 Unilateral primary osteoarthritis, right knee: Secondary | ICD-10-CM

## 2016-07-29 DIAGNOSIS — I25119 Atherosclerotic heart disease of native coronary artery with unspecified angina pectoris: Secondary | ICD-10-CM

## 2016-07-29 DIAGNOSIS — F329 Major depressive disorder, single episode, unspecified: Secondary | ICD-10-CM | POA: Diagnosis not present

## 2016-07-29 DIAGNOSIS — M25562 Pain in left knee: Secondary | ICD-10-CM

## 2016-07-29 LAB — COMPREHENSIVE METABOLIC PANEL
ALK PHOS: 86 U/L (ref 33–130)
ALT: 8 U/L (ref 6–29)
AST: 18 U/L (ref 10–35)
Albumin: 4.1 g/dL (ref 3.6–5.1)
BILIRUBIN TOTAL: 0.3 mg/dL (ref 0.2–1.2)
BUN: 9 mg/dL (ref 7–25)
CO2: 27 mmol/L (ref 20–31)
CREATININE: 0.75 mg/dL (ref 0.50–0.99)
Calcium: 9.8 mg/dL (ref 8.6–10.4)
Chloride: 98 mmol/L (ref 98–110)
GLUCOSE: 151 mg/dL — AB (ref 65–99)
Potassium: 4.7 mmol/L (ref 3.5–5.3)
SODIUM: 136 mmol/L (ref 135–146)
Total Protein: 6.8 g/dL (ref 6.1–8.1)

## 2016-07-29 LAB — CBC WITH DIFFERENTIAL/PLATELET
BASOS PCT: 0 %
Basophils Absolute: 0 cells/uL (ref 0–200)
Eosinophils Absolute: 206 cells/uL (ref 15–500)
Eosinophils Relative: 2 %
HEMATOCRIT: 39.2 % (ref 35.0–45.0)
Hemoglobin: 13.3 g/dL (ref 11.7–15.5)
LYMPHS PCT: 39 %
Lymphs Abs: 4017 cells/uL — ABNORMAL HIGH (ref 850–3900)
MCH: 30.3 pg (ref 27.0–33.0)
MCHC: 33.9 g/dL (ref 32.0–36.0)
MCV: 89.3 fL (ref 80.0–100.0)
MONO ABS: 618 {cells}/uL (ref 200–950)
MONOS PCT: 6 %
MPV: 9.2 fL (ref 7.5–12.5)
Neutro Abs: 5459 cells/uL (ref 1500–7800)
Neutrophils Relative %: 53 %
PLATELETS: 292 10*3/uL (ref 140–400)
RBC: 4.39 MIL/uL (ref 3.80–5.10)
RDW: 13.6 % (ref 11.0–15.0)
WBC: 10.3 10*3/uL (ref 3.8–10.8)

## 2016-07-29 MED ORDER — GLIPIZIDE ER 5 MG PO TB24: 5.0000 mg | ORAL_TABLET | Freq: Every day | ORAL | 5 refills | Status: DC

## 2016-07-29 NOTE — Progress Notes (Signed)
Subjective:    Patient ID: Anna Noble, female    DOB: 1956/08/02, 60 y.o.   MRN: [id number]  07/29/2016  Follow-up (A1C)   HPI This 60 y.o. female presents for three month follow-up of DMII, hypertension, CAD, hypercholesterolemia, fibromyalgia.  Ex-husband had an affair with female; after 48 years, got divorced.  Now ex-husband married to boyfriend.  Lost 3,000 per month when moved in with boyfriend.  Was getting $5,000/month with alimony and child support.  Lost house when dropped alimony.  Checking sugars are high; running 129-199; ate a couple of bite sized candy bars and spaghetti for supper.  Has been running 129-279.  Does not recall sulfa allergy; mother always said that patient is allergic to it; not sure that has ever taken sulfa.  Paying $30/month for medications.   L knee pain: onset two weeks ago.  No injury.  S/p ortho consultation for R knee; three choices: cortisone, foam, replacement.  Took cortisone shot with significant improvement.    BP Readings from Last 3 Encounters:  07/29/16 118/72  05/05/16 126/82  04/29/16 130/80   Wt Readings from Last 3 Encounters:  07/29/16 209 lb (94.8 kg)  05/05/16 205 lb (93 kg)  04/29/16 208 lb (94.3 kg)    Review of Systems  Constitutional: Negative for chills, diaphoresis, fatigue and fever.  Eyes: Negative for visual disturbance.  Respiratory: Negative for cough and shortness of breath.   Cardiovascular: Negative for chest pain, palpitations and leg swelling.  Gastrointestinal: Negative for abdominal pain, constipation, diarrhea, nausea and vomiting.  Endocrine: Negative for cold intolerance, heat intolerance, polydipsia, polyphagia and polyuria.  Musculoskeletal: Positive for arthralgias, gait problem and joint swelling.  Neurological: Negative for dizziness, tremors, seizures, syncope, facial asymmetry, speech difficulty, weakness, light-headedness, numbness and headaches.  Psychiatric/Behavioral: Positive for dysphoric mood.  Negative for self-injury, sleep disturbance and suicidal ideas. The patient is nervous/anxious.     Past Medical History:  Diagnosis Date  . Acute myocardial infarction of other anterior wall, episode of care unspecified   . Allergy   . Arthritis   . Coronary atherosclerosis of native coronary artery   . Depression   . DM (diabetes mellitus) ([place])   . Dysmetabolic syndrome X   . HLD (hyperlipidemia)   . HTN (hypertension)   . Hypothyroidism   . Neuromuscular disorder ([place])   . Obesity, unspecified   . Tobacco use disorder    Past Surgical History:  Procedure Laterality Date  . ACHILLES TENDON SURGERY Left   . CARDIAC CATHETERIZATION    . Cardiac stenting    . EYE SURGERY    . HAND SURGERY Right    ganglion cyst, bone graft  . KNEE ARTHROSCOPY Right 1976  . STRABISMUS SURGERY Left 1959  . TOE SURGERY Right    x 3  . TUBAL LIGATION     Allergies  Allergen Reactions  . Aspirin   . Latex   . Nsaids   . Penicillins   . Sulfonamide Derivatives     Social History   Social History  . Marital status: Divorced    Spouse name: N/A  . Number of children: 2  . Years of education: N/A   Occupational History  . employed [employer] at [place] History Main Topics  . Smoking status: Current Every Day Smoker    Packs/day: 0.50    Years: 30.00    Types: Cigarettes  . Smokeless tobacco: Current User  .  Alcohol use 1.2 oz/week    2 Cans of beer per week     Comment: twice a week.  . Drug use: No  . Sexual activity: Yes    Partners: Male    Birth control/ protection: None   Other Topics Concern  . Not on file   Social History Narrative   Marital status: Divorced after 30 years of marriage in 2007.  Dating same gentleman/Frank x 8 years.   Children: 2 children, no grandchildren.  Oldest son with Down's syndrome.     Living: with boyfriend/Frank.   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    Tobacco: 1/2 ppd x 18 years.   Alcohol: 2 drinks on Thursday night/socially.   Drugs: none   Exercise: walking three days per week; work related.   Seatbelt: 100%; no texting      Family History  Problem Relation Age of Onset  . Dementia Mother   . Hypertension Mother   . Diabetes Mother   . Depression Father   . Heart disease Father 87    AMI age 3; CABG.  . Prostate cancer Father   . Hypertension Father   . Cancer Father     prostate cancer  . Hypertension Brother   . Arthritis Brother   . Heart disease Brother 109    AMI  . Diabetes Sister   . Heart disease Sister 44    mild AMI  . Hypertension Sister   . Multiple sclerosis Sister   . Down syndrome Son   . Colon cancer Maternal Aunt 45       Objective:    BP 118/72 (BP Location: Left Arm, Patient Position: Sitting, Cuff Size: Small)   Pulse 92   Temp 98.1 F (36.7 C) (Oral)   Resp 18   Ht 5' 8"  (1.727 m)   Wt 209 lb (94.8 kg)   SpO2 95%   BMI 31.78 kg/m  Physical Exam  Constitutional: She is oriented to person, place, and time. She appears well-developed and well-nourished. No distress.  HENT:  Head: Normocephalic and atraumatic.  Right Ear: External ear normal.  Left Ear: External ear normal.  Nose: Nose normal.  Mouth/Throat: Oropharynx is clear and moist.  Eyes: Conjunctivae and EOM are normal. Pupils are equal, round, and reactive to light.  Neck: Normal range of motion. Neck supple. Carotid bruit is not present. No thyromegaly present.  Cardiovascular: Normal rate, regular rhythm, normal heart sounds and intact distal pulses.  Exam reveals no gallop and no friction rub.   No murmur heard. Pulmonary/Chest: Effort normal and breath sounds normal. She has no wheezes. She has no rales.  Abdominal: Soft. Bowel sounds are normal. She exhibits no distension and no mass. There is no tenderness. There is no rebound and no guarding.  Musculoskeletal:       Right knee: She exhibits decreased range of motion.  She exhibits no swelling, no effusion, no ecchymosis, no deformity, no laceration, no erythema, no bony tenderness, normal meniscus and no MCL laxity. Tenderness found. Medial joint line and lateral joint line tenderness noted. No MCL, no LCL and no patellar tendon tenderness noted.       Left knee: She exhibits decreased range of motion. She exhibits no swelling, no effusion, no ecchymosis and no bony tenderness. No tenderness found. No medial joint line, no lateral joint line, no MCL, no LCL and no patellar tendon tenderness noted.  Lymphadenopathy:    She has no  cervical adenopathy.  Neurological: She is alert and oriented to person, place, and time. No cranial nerve deficit.  Skin: Skin is warm and dry. No rash noted. She is not diaphoretic. No erythema. No pallor.  Psychiatric: She has a normal mood and affect. Her behavior is normal.   Results for orders placed or performed in visit on 07/29/16  CBC with Differential/Platelet  Result Value Ref Range   WBC 10.3 3.8 - 10.8 K/uL   RBC 4.39 3.80 - 5.10 MIL/uL   Hemoglobin 13.3 11.7 - 15.5 g/dL   HCT 39.2 35.0 - 45.0 %   MCV 89.3 80.0 - 100.0 fL   MCH 30.3 27.0 - 33.0 pg   MCHC 33.9 32.0 - 36.0 g/dL   RDW 13.6 11.0 - 15.0 %   Platelets 292 140 - 400 K/uL   MPV 9.2 7.5 - 12.5 fL   Neutro Abs 5,459 1,500 - 7,800 cells/uL   Lymphs Abs 4,017 (H) 850 - 3,900 cells/uL   Monocytes Absolute 618 200 - 950 cells/uL   Eosinophils Absolute 206 15 - 500 cells/uL   Basophils Absolute 0 0 - 200 cells/uL   Neutrophils Relative % 53 %   Lymphocytes Relative 39 %   Monocytes Relative 6 %   Eosinophils Relative 2 %   Basophils Relative 0 %   Smear Review Criteria for review not met   Comprehensive metabolic panel  Result Value Ref Range   Sodium 136 135 - 146 mmol/L   Potassium 4.7 3.5 - 5.3 mmol/L   Chloride 98 98 - 110 mmol/L   CO2 27 20 - 31 mmol/L   Glucose, Bld 151 (H) 65 - 99 mg/dL   BUN 9 7 - 25 mg/dL   Creat 0.75 0.50 - 0.99 mg/dL    Total Bilirubin 0.3 0.2 - 1.2 mg/dL   Alkaline Phosphatase 86 33 - 130 U/L   AST 18 10 - 35 U/L   ALT 8 6 - 29 U/L   Total Protein 6.8 6.1 - 8.1 g/dL   Albumin 4.1 3.6 - 5.1 g/dL   Calcium 9.8 8.6 - 10.4 mg/dL  Hemoglobin A1c  Result Value Ref Range   Hgb A1c MFr Bld 7.5 (H) <5.7 %   Mean Plasma Glucose 169 mg/dL       Assessment & Plan:   1. Type 2 diabetes mellitus with other circulatory complication, without long-term current use of insulin ([place])   2. Atherosclerosis of native coronary artery of native heart with angina pectoris ([place])   3. Fibromyalgia   4. Reactive depression   5. Acute pain of left knee   6. Primary osteoarthritis of right knee    -uncontrolled; add Glipizide ER 28m daily. Obtain labs. -continue current medications. -acute L knee pain; known OA R knee; recommend rest, icing, stretches; declined xray today.   Orders Placed This Encounter  Procedures  . CBC with Differential/Platelet  . Comprehensive metabolic panel  . Hemoglobin A1c   Meds ordered this encounter  Medications  . DISCONTD: glipiZIDE (GLUCOTROL XL) 5 MG 24 hr tablet    Sig: Take 1 tablet (5 mg total) by mouth daily with breakfast.    Dispense:  30 tablet    Refill:  5    Return in about 3 months (around 10/29/2016) for recheck.   Aiesha Leland MElayne Guerin M.D. [facility]  [phone] [phone]

## 2016-07-29 NOTE — Patient Instructions (Signed)
     IF you received an x-ray today, you will receive an invoice from [facility]. Please contact [facility] at [phone] with questions or concerns regarding your invoice.   IF you received labwork today, you will receive an invoice from [facility]. Please contact [facility] at [phone] with questions or concerns regarding your invoice.   Our billing staff will not be able to assist you with questions regarding bills from these companies.  You will be contacted with the lab results as soon as they are available. The fastest way to get your results is to activate your My Chart account. Instructions are located on the last page of this paperwork. If you have not heard from us regarding the results in 2 weeks, please contact this office.      

## 2016-07-30 LAB — HEMOGLOBIN A1C
Hgb A1c MFr Bld: 7.5 % — ABNORMAL HIGH (ref <5.7)
Mean Plasma Glucose: 169 mg/dL

## 2016-08-05 ENCOUNTER — Telehealth: Payer: Self-pay

## 2016-08-05 NOTE — Telephone Encounter (Signed)
Please advise. Thank you

## 2016-08-05 NOTE — Telephone Encounter (Signed)
PATIENT STATES SHE CAME INTO THE OFFICE AND SAW DR. Katrinka BlazingSMITH LAST Wednesday. SHE PUT HER ON GLIPIZIDE 5 MG 24 HOUR TABLET AND TOLD HER TO CALL BACK IF SHE COULD NOT TOLERATE IT. PATIENT STATES SHE CAN TOLERATE THE MEDICINE JUST FINE, BUT HER SUGAR READINGS ARE STILL RUNNING HIGH. DR. Katrinka BlazingSMITH SAID SHE MAY HAVE TO UP HER DOSAGE. BEST PHONE [phone] (CELL)  PHARMACY CHOICE IS [facility] ON [address].  MBC

## 2016-08-10 MED ORDER — GLIPIZIDE ER 10 MG PO TB24: 10.0000 mg | ORAL_TABLET | Freq: Every day | ORAL | 3 refills | Status: DC

## 2016-08-10 NOTE — Telephone Encounter (Signed)
Please call for detailed sugar readings.  What are her sugars running?  What time of the day is she checking her sugars?

## 2016-08-10 NOTE — Telephone Encounter (Signed)
Called and gave pt detailed instr's on how to increase her glipizide. Pt did confirm that she is still taking her metformin BID. Pt agreed to plan and will let us know if the increased dose does not bring down BS enough.

## 2016-08-10 NOTE — Telephone Encounter (Signed)
Call --- 1. Confirm that patient is still taking Metformin 1000mg  bid.  2.  Recommend patient increase Glipizide 5mg  to one tablet with breakfast and one tablet with supper until current prescription is gone.  I have sent in Glipizide 10mg  that she will start when she finishes current rx; she will take it one tablet with supper.

## 2016-08-10 NOTE — Telephone Encounter (Signed)
Spoke to pt who clarified that her BS has been running between 179- 279, taken in the morning fasting. Pt stated that she does not normally eat breakfast, and sometimes not lunch if she is not hungry. Her main meal is dinner and she takes the glipizide with dinner, and her BS is still that high the next morning.

## 2016-08-14 ENCOUNTER — Other Ambulatory Visit: Payer: Self-pay

## 2016-08-14 DIAGNOSIS — I10 Essential (primary) hypertension: Secondary | ICD-10-CM

## 2016-08-14 MED ORDER — SIMVASTATIN 40 MG PO TABS: 40.0000 mg | ORAL_TABLET | Freq: Every day | ORAL | 1 refills | Status: DC

## 2016-08-14 MED ORDER — CARVEDILOL 12.5 MG PO TABS: 12.5000 mg | ORAL_TABLET | Freq: Two times a day (BID) | ORAL | 1 refills | Status: DC

## 2016-08-14 MED ORDER — HYDROCHLOROTHIAZIDE 25 MG PO TABS: 25.0000 mg | ORAL_TABLET | Freq: Every day | ORAL | 1 refills | Status: DC

## 2016-08-14 NOTE — Addendum Note (Signed)
Addended by: Clarene CritchleyKOLLER, KAREN M on: 08/14/2016 10:14 AM   Modules accepted: Orders

## 2016-08-14 NOTE — Telephone Encounter (Signed)
07/29/16 last ov and labs  01/2016 last refill

## 2016-09-26 ENCOUNTER — Other Ambulatory Visit: Payer: Self-pay | Admitting: Family Medicine

## 2016-09-26 DIAGNOSIS — F32A Depression, unspecified: Secondary | ICD-10-CM

## 2016-09-26 DIAGNOSIS — F329 Major depressive disorder, single episode, unspecified: Secondary | ICD-10-CM

## 2016-09-28 NOTE — Telephone Encounter (Signed)
SS refill req Celexa, Metformin

## 2016-10-21 ENCOUNTER — Other Ambulatory Visit: Payer: Self-pay | Admitting: Family Medicine

## 2016-10-21 DIAGNOSIS — E039 Hypothyroidism, unspecified: Secondary | ICD-10-CM

## 2016-10-26 ENCOUNTER — Other Ambulatory Visit: Payer: Self-pay | Admitting: Family Medicine

## 2016-10-26 NOTE — Telephone Encounter (Signed)
Meds ordered this encounter  Medications  . enalapril (VASOTEC) 10 MG tablet    Sig: TAKE ONE TABLET BY MOUTH TWICE DAILY    Dispense:  60 tablet    Refill:  3    OV with Dr. Katrinka BlazingSmith scheduled for 11/03/2016

## 2016-11-03 ENCOUNTER — Encounter: Payer: Self-pay | Admitting: Family Medicine

## 2016-11-03 ENCOUNTER — Ambulatory Visit (INDEPENDENT_AMBULATORY_CARE_PROVIDER_SITE_OTHER): Payer: BLUE CROSS/BLUE SHIELD | Admitting: Family Medicine

## 2016-11-03 VITALS — BP 137/77 | HR 87 | Temp 98.4°F | Resp 16 | Ht 67.25 in | Wt 208.8 lb

## 2016-11-03 DIAGNOSIS — I2510 Atherosclerotic heart disease of native coronary artery without angina pectoris: Secondary | ICD-10-CM | POA: Diagnosis not present

## 2016-11-03 DIAGNOSIS — E7800 Pure hypercholesterolemia, unspecified: Secondary | ICD-10-CM

## 2016-11-03 DIAGNOSIS — E1159 Type 2 diabetes mellitus with other circulatory complications: Secondary | ICD-10-CM

## 2016-11-03 DIAGNOSIS — M797 Fibromyalgia: Secondary | ICD-10-CM

## 2016-11-03 DIAGNOSIS — I10 Essential (primary) hypertension: Secondary | ICD-10-CM

## 2016-11-03 DIAGNOSIS — E785 Hyperlipidemia, unspecified: Secondary | ICD-10-CM | POA: Insufficient documentation

## 2016-11-03 DIAGNOSIS — F329 Major depressive disorder, single episode, unspecified: Secondary | ICD-10-CM

## 2016-11-03 MED ORDER — HYDROCODONE-ACETAMINOPHEN 5-325 MG PO TABS: 1.0000 | ORAL_TABLET | Freq: Two times a day (BID) | ORAL | 0 refills | Status: DC | PRN

## 2016-11-03 MED ORDER — METFORMIN HCL 1000 MG PO TABS: 1000.0000 mg | ORAL_TABLET | Freq: Two times a day (BID) | ORAL | 5 refills | Status: DC

## 2016-11-03 MED ORDER — GLIPIZIDE ER 10 MG PO TB24: 10.0000 mg | ORAL_TABLET | Freq: Every day | ORAL | 5 refills | Status: DC

## 2016-11-03 MED ORDER — CARVEDILOL 12.5 MG PO TABS: 12.5000 mg | ORAL_TABLET | Freq: Two times a day (BID) | ORAL | 11 refills | Status: DC

## 2016-11-03 MED ORDER — CYCLOBENZAPRINE HCL 5 MG PO TABS: 5.0000 mg | ORAL_TABLET | Freq: Three times a day (TID) | ORAL | 5 refills | Status: DC | PRN

## 2016-11-03 MED ORDER — ENALAPRIL MALEATE 10 MG PO TABS: ORAL_TABLET | ORAL | 11 refills | Status: DC

## 2016-11-03 MED ORDER — GABAPENTIN 300 MG PO CAPS: ORAL_CAPSULE | ORAL | 5 refills | Status: DC

## 2016-11-03 MED ORDER — DULOXETINE HCL 30 MG PO CPEP: 30.0000 mg | ORAL_CAPSULE | Freq: Every day | ORAL | 5 refills | Status: DC

## 2016-11-03 MED ORDER — HYDROCHLOROTHIAZIDE 25 MG PO TABS: 25.0000 mg | ORAL_TABLET | Freq: Every day | ORAL | 11 refills | Status: DC

## 2016-11-03 MED ORDER — LEVOTHYROXINE SODIUM 75 MCG PO TABS: 37.5000 ug | ORAL_TABLET | Freq: Every day | ORAL | 5 refills | Status: DC

## 2016-11-03 MED ORDER — SIMVASTATIN 40 MG PO TABS: 40.0000 mg | ORAL_TABLET | Freq: Every day | ORAL | 11 refills | Status: DC

## 2016-11-03 NOTE — Progress Notes (Signed)
Subjective:    Patient ID: Anna Noble, female    DOB: [birth year], 61 y.o.   MRN: [id number]  11/03/2016  Follow-up (4 MONTH -check a1c) and Medication Refill (ALL MEDICATION per patient)   HPI This 61 y.o. female presents for three month follow-up of DMII, hypertension, hypercholesterolemia, fibromyalgia. Added Glipizide ER 5mg  daily at last visit.  Increased to 10mg  daily.    Not depressed; no motivation.  Has been taking Citalopram for years ten.  Has taken Zoloft (worked for a while), Effexor (irritable).  No energy.  Retiring in three weeks.  Will be able to get SS in three weeks.   Osteoarthritis B knees: s/p injection in R knee; three months later injected L knee.  Suggesting replacements in both knees.  Wearing knee brace at home.    Smoking 1 ppd.  Immunization History  Administered Date(s) Administered  . Influenza,inj,Quad PF,36+ Mos 07/31/2013, 12/19/2014, 10/25/2015  . Influenza-Unspecified 06/15/2016  . Pneumococcal Polysaccharide-23 09/28/2010, 10/25/2015  . Tdap 12/19/2014  . Zoster 11/26/2015   BP Readings from Last 3 Encounters:  11/03/16 137/77  07/29/16 118/72  05/05/16 126/82   Wt Readings from Last 3 Encounters:  11/03/16 208 lb 12.8 oz (94.7 kg)  07/29/16 209 lb (94.8 kg)  05/05/16 205 lb (93 kg)    Review of Systems  Constitutional: Negative for chills, diaphoresis, fatigue and fever.  Eyes: Negative for visual disturbance.  Respiratory: Negative for cough and shortness of breath.   Cardiovascular: Negative for chest pain, palpitations and leg swelling.  Gastrointestinal: Negative for abdominal pain, constipation, diarrhea, nausea and vomiting.  Endocrine: Negative for cold intolerance, heat intolerance, polydipsia, polyphagia and polyuria.  Neurological: Negative for dizziness, tremors, seizures, syncope, facial asymmetry, speech difficulty, weakness, light-headedness, numbness and headaches.  Psychiatric/Behavioral: Negative for dysphoric mood  and sleep disturbance. The patient is not nervous/anxious.     Past Medical History:  Diagnosis Date  . Acute myocardial infarction of other anterior wall, episode of care unspecified   . Allergy   . Arthritis   . Coronary atherosclerosis of native coronary artery   . Depression   . DM (diabetes mellitus) (HCC)   . Dysmetabolic syndrome X   . HLD (hyperlipidemia)   . HTN (hypertension)   . Hypothyroidism   . Neuromuscular disorder (HCC)   . Obesity, unspecified   . Tobacco use disorder    Past Surgical History:  Procedure Laterality Date  . ACHILLES TENDON SURGERY Left   . CARDIAC CATHETERIZATION    . Cardiac stenting    . EYE SURGERY    . HAND SURGERY Right    ganglion cyst, bone graft  . KNEE ARTHROSCOPY Right 1976  . STRABISMUS SURGERY Left 1959  . TOE SURGERY Right    x 3  . TUBAL LIGATION     Allergies  Allergen Reactions  . Aspirin   . Latex   . Nsaids   . Penicillins   . Sulfonamide Derivatives     Social History   Social History  . Marital status: Divorced    Spouse name: N/A  . Number of children: 2  . Years of education: N/A   Occupational History  . employed [employer] at [employer]   Social History Main Topics  . Smoking status: Current Every Day Smoker    Packs/day: 0.50    Years: 30.00    Types: Cigarettes  . Smokeless tobacco: Current User  . Alcohol use 1.2 oz/week  2 Cans of beer per week     Comment: twice a week.  . Drug use: No  . Sexual activity: Yes    Partners: Male    Birth control/ protection: None   Other Topics Concern  . Not on file   Social History Narrative   Marital status: Divorced after 30 years of marriage in 2007.  Dating same gentleman/Frank x 8 years.   Children: 2 children, no grandchildren.  Oldest son with Down's syndrome.     Living: with boyfriend/Frank.   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   Tobacco: 1/2 ppd x 18 years.   Alcohol: 2  drinks on Thursday night/socially.   Drugs: none   Exercise: walking three days per week; work related.   Seatbelt: 100%; no texting      Family History  Problem Relation Age of Onset  . Dementia Mother   . Hypertension Mother   . Diabetes Mother   . Depression Father   . Heart disease Father [id number]    AMI age 62; CABG.  . Prostate cancer Father   . Hypertension Father   . Cancer Father     prostate cancer  . Hypertension Brother   . Arthritis Brother   . Heart disease Brother 35    AMI  . Diabetes Sister   . Heart disease Sister 4669    mild AMI  . Hypertension Sister   . Multiple sclerosis Sister   . Down syndrome Son   . Colon cancer Maternal Aunt 45       Objective:    BP 137/77 (BP Location: Right Arm, Patient Position: Sitting, Cuff Size: Normal)   Pulse 87   Temp 98.4 F (36.9 C) (Oral)   Resp 16   Ht 5' 7.25" (1.708 m)   Wt 208 lb 12.8 oz (94.7 kg)   SpO2 97%   BMI 32.46 kg/m  Physical Exam  Constitutional: She is oriented to person, place, and time. She appears well-developed and well-nourished. No distress.  HENT:  Head: Normocephalic and atraumatic.  Right Ear: External ear normal.  Left Ear: External ear normal.  Nose: Nose normal.  Mouth/Throat: Oropharynx is clear and moist.  Eyes: Conjunctivae and EOM are normal. Pupils are equal, round, and reactive to light.  Neck: Normal range of motion. Neck supple. Carotid bruit is not present. No thyromegaly present.  Cardiovascular: Normal rate, regular rhythm, normal heart sounds and intact distal pulses.  Exam reveals no gallop and no friction rub.   No murmur heard. Pulmonary/Chest: Effort normal and breath sounds normal. She has no wheezes. She has no rales.  Abdominal: Soft. Bowel sounds are normal. She exhibits no distension and no mass. There is no tenderness. There is no rebound and no guarding.  Lymphadenopathy:    She has no cervical adenopathy.  Neurological: She is alert and oriented to person,  place, and time. No cranial nerve deficit.  Skin: Skin is warm and dry. No rash noted. She is not diaphoretic. No erythema. No pallor.  Psychiatric: She has a normal mood and affect. Her behavior is normal.   Depression screen [facility] 2/[facility] 07/29/2016 05/05/2016 04/29/2016 02/25/2016 10/25/2015  Decreased Interest 0 0 0 1 1  Down, Depressed, Hopeless 0 0 0 0 1  PHQ - 2 Score 0 0 0 1 2  Altered sleeping - - - - 0  Tired, decreased energy - - - - 1  Change in appetite - - - -  2  Feeling bad or failure about yourself  - - - - 0  Trouble concentrating - - - - 0  Moving slowly or fidgety/restless - - - - 0  Suicidal thoughts - - - - 0  PHQ-9 Score - - - - 5        Assessment & Plan:   1. Type 2 diabetes mellitus with other circulatory complication, without long-term current use of insulin (HCC)   2. Fibromyalgia   3. Reactive depression   4. Atherosclerosis of native coronary artery of native heart without angina pectoris   5. Pure hypercholesterolemia   6. Essential hypertension, benign    -uncontrolled depression; wean Citalopram; rx for Cymbalta provided due to anxiety/depression, fibromyalgia, chronic pain. -obtain labs. -refills provided.   Orders Placed This Encounter  Procedures  . CBC with Differential/Platelet  . Comprehensive metabolic panel    Order Specific Question:   Has the patient fasted?    Answer:   Yes  . Hemoglobin A1c  . Lipid panel    Order Specific Question:   Has the patient fasted?    Answer:   Yes   Meds ordered this encounter  Medications  . DULoxetine (CYMBALTA) 30 MG capsule    Sig: Take 1 capsule (30 mg total) by mouth daily.    Dispense:  30 capsule    Refill:  5  . carvedilol (COREG) 12.5 MG tablet    Sig: Take 1 tablet (12.5 mg total) by mouth 2 (two) times daily.    Dispense:  60 tablet    Refill:  11  . cyclobenzaprine (FLEXERIL) 5 MG tablet    Sig: Take 1 tablet (5 mg total) by mouth 3 (three) times daily as needed.    Dispense:  90 tablet     Refill:  5  . enalapril (VASOTEC) 10 MG tablet    Sig: TAKE ONE TABLET BY MOUTH TWICE DAILY    Dispense:  60 tablet    Refill:  11  . gabapentin (NEURONTIN) 300 MG capsule    Sig: Take one capsule by mouth in the morning, one by mouth in the afternoon, and three at bedtime    Dispense:  120 capsule    Refill:  5  . glipiZIDE (GLUCOTROL XL) 10 MG 24 hr tablet    Sig: Take 1 tablet (10 mg total) by mouth daily with breakfast.    Dispense:  30 tablet    Refill:  5  . hydrochlorothiazide (HYDRODIURIL) 25 MG tablet    Sig: Take 1 tablet (25 mg total) by mouth daily.    Dispense:  30 tablet    Refill:  11  . HYDROcodone-acetaminophen (NORCO/VICODIN) 5-325 MG tablet    Sig: Take 1 tablet by mouth 2 (two) times daily as needed for moderate pain.    Dispense:  30 tablet    Refill:  0  . levothyroxine (SYNTHROID, LEVOTHROID) 75 MCG tablet    Sig: Take 0.5 tablets (37.5 mcg total) by mouth daily.    Dispense:  30 tablet    Refill:  5  . metFORMIN (GLUCOPHAGE) 1000 MG tablet    Sig: Take 1 tablet (1,000 mg total) by mouth 2 (two) times daily with a meal.    Dispense:  60 tablet    Refill:  5  . simvastatin (ZOCOR) 40 MG tablet    Sig: Take 1 tablet (40 mg total) by mouth at bedtime.    Dispense:  30 tablet    Refill:  11  Return in about 3 months (around 01/31/2017) for recheck diabetes, high blood pressure, depression.   Rechel Delosreyes Paulita Fujita, M.D. Primary Care at [facility] previously [facility] [address] [place], [place]  [address] [phone] phone [phone] fax

## 2016-11-03 NOTE — Patient Instructions (Addendum)
Fat and Cholesterol Restricted Diet Introduction Getting too much fat and cholesterol in your diet may cause health problems. Following this diet helps keep your fat and cholesterol at normal levels. This can keep you from getting sick. What types of fat should I choose?  Choose monosaturated and polyunsaturated fats. These are found in foods such as olive oil, canola oil, flaxseeds, walnuts, almonds, and seeds.  Eat more omega-3 fats. Good choices include salmon, mackerel, sardines, tuna, flaxseed oil, and ground flaxseeds.  Limit saturated fats. These are in animal products such as meats, butter, and cream. They can also be in plant products such as palm oil, palm kernel oil, and coconut oil.  Avoid foods with partially hydrogenated oils in them. These contain trans fats. Examples of foods that have trans fats are stick margarine, some tub margarines, cookies, crackers, and other baked goods. What general guidelines do I need to follow?  Check food labels. Look for the words "trans fat" and "saturated fat."  When preparing a meal:  Fill half of your plate with vegetables and green salads.  Fill one fourth of your plate with whole grains. Look for the word "whole" as the first word in the ingredient list.  Fill one fourth of your plate with lean protein foods.  Eat more foods that have fiber, like apples, carrots, beans, peas, and barley.  Eat more home-cooked foods. Eat less at restaurants and buffets.  Limit or avoid alcohol.  Limit foods high in starch and sugar.  Limit fried foods.  Cook foods without frying them. Baking, boiling, grilling, and broiling are all great options.  Lose weight if you are overweight. Losing even a small amount of weight can help your overall health. It can also help prevent diseases such as diabetes and heart disease. What foods can I eat? Grains  Whole grains, such as whole wheat or whole grain breads, crackers, cereals, and pasta. Unsweetened  oatmeal, bulgur, barley, quinoa, or brown rice. Corn or whole wheat flour tortillas. Vegetables  Fresh or frozen vegetables (raw, steamed, roasted, or grilled). Green salads. Fruits  All fresh, canned (in natural juice), or frozen fruits. Meat and Other Protein Products  Ground beef (85% or leaner), grass-fed beef, or beef trimmed of fat. Skinless chicken or turkey. Ground chicken or turkey. Pork trimmed of fat. All fish and seafood. Eggs. Dried beans, peas, or lentils. Unsalted nuts or seeds. Unsalted canned or dry beans. Dairy  Low-fat dairy products, such as skim or 1% milk, 2% or reduced-fat cheeses, low-fat ricotta or cottage cheese, or plain low-fat yogurt. Fats and Oils  Tub margarines without trans fats. Light or reduced-fat mayonnaise and salad dressings. Avocado. Olive, canola, sesame, or safflower oils. Natural peanut or almond butter (choose ones without added sugar and oil). The items listed above may not be a complete list of recommended foods or beverages. Contact your dietitian for more options.  What foods are not recommended? Grains  White bread. White pasta. White rice. Cornbread. Bagels, pastries, and croissants. Crackers that contain trans fat. Vegetables  White potatoes. Corn. Creamed or fried vegetables. Vegetables in a cheese sauce. Fruits  Dried fruits. Canned fruit in light or heavy syrup. Fruit juice. Meat and Other Protein Products  Fatty cuts of meat. Ribs, chicken wings, bacon, sausage, bologna, salami, chitterlings, fatback, hot dogs, bratwurst, and packaged luncheon meats. Liver and organ meats. Dairy  Whole or 2% milk, cream, half-and-half, and cream cheese. Whole milk cheeses. Whole-fat or sweetened yogurt. Full-fat cheeses. Nondairy creamers and   whipped toppings. Processed cheese, cheese spreads, or cheese curds. Sweets and Desserts  Corn syrup, sugars, honey, and molasses. Candy. Jam and jelly. Syrup. Sweetened cereals. Cookies, pies, cakes, donuts,  muffins, and ice cream. Fats and Oils  Butter, stick margarine, lard, shortening, ghee, or bacon fat. Coconut, palm kernel, or palm oils. Beverages  Alcohol. Sweetened drinks (such as sodas, lemonade, and fruit drinks or punches). The items listed above may not be a complete list of foods and beverages to avoid. Contact your dietitian for more information.  This information is not intended to replace advice given to you by your health care provider. Make sure you discuss any questions you have with your health care provider. Document Released: 03/15/2012 Document Revised: 05/21/2016 Document Reviewed: 12/14/2013  2017 Elsevier  Decrease CITALOPRAM 40MG  TO 1/2 TABLET DAILY FOR TWO WEEKS, THEN DECREASE TO 1/2 TABLET EVERY OTHER DAY FOR TWO WEEKS, AND THEN STOP. Start Cymbalta 30mg  one tablet every morning.   IF you received an x-ray today, you will receive an invoice from [facility]. Please contact [facility] at [phone] with questions or concerns regarding your invoice.   IF you received labwork today, you will receive an invoice from [facility]. Please contact [facility] at [phone] with questions or concerns regarding your invoice.   Our billing staff will not be able to assist you with questions regarding bills from these companies.  You will be contacted with the lab results as soon as they are available. The fastest way to get your results is to activate your My Chart account. Instructions are located on the last page of this paperwork. If you have not heard from us regarding the results in 2 weeks, please contact this office.

## 2016-11-04 LAB — COMPREHENSIVE METABOLIC PANEL
ALBUMIN: 4.5 g/dL (ref 3.6–4.8)
ALK PHOS: 106 IU/L (ref 39–117)
ALT: 7 IU/L (ref 0–32)
AST: 14 IU/L (ref 0–40)
Albumin/Globulin Ratio: 1.8 (ref 1.2–2.2)
BILIRUBIN TOTAL: 0.3 mg/dL (ref 0.0–1.2)
BUN / CREAT RATIO: 11 — AB (ref 12–28)
BUN: 8 mg/dL (ref 8–27)
CHLORIDE: 97 mmol/L (ref 96–106)
CO2: 26 mmol/L (ref 18–29)
Calcium: 9.6 mg/dL (ref 8.7–10.3)
Creatinine, Ser: 0.72 mg/dL (ref 0.57–1.00)
GFR calc non Af Amer: 91 mL/min/{1.73_m2} (ref >59)
GFR, EST AFRICAN AMERICAN: 105 mL/min/{1.73_m2} (ref >59)
GLOBULIN, TOTAL: 2.5 g/dL (ref 1.5–4.5)
Glucose: 126 mg/dL — ABNORMAL HIGH (ref 65–99)
Potassium: 4.6 mmol/L (ref 3.5–5.2)
SODIUM: 139 mmol/L (ref 134–144)
TOTAL PROTEIN: 7 g/dL (ref 6.0–8.5)

## 2016-11-04 LAB — CBC WITH DIFFERENTIAL/PLATELET
BASOS ABS: 0.1 10*3/uL (ref 0.0–0.2)
BASOS: 1 %
EOS (ABSOLUTE): 0.2 10*3/uL (ref 0.0–0.4)
EOS: 2 %
HEMATOCRIT: 39 % (ref 34.0–46.6)
HEMOGLOBIN: 13 g/dL (ref 11.1–15.9)
IMMATURE GRANS (ABS): 0.1 10*3/uL (ref 0.0–0.1)
Immature Granulocytes: 1 %
LYMPHS: 37 %
Lymphocytes Absolute: 3.4 10*3/uL — ABNORMAL HIGH (ref 0.7–3.1)
MCH: 30 pg (ref 26.6–33.0)
MCHC: 33.3 g/dL (ref 31.5–35.7)
MCV: 90 fL (ref 79–97)
Monocytes Absolute: 0.6 10*3/uL (ref 0.1–0.9)
Monocytes: 6 %
NEUTROS ABS: 4.9 10*3/uL (ref 1.4–7.0)
Neutrophils: 53 %
Platelets: 271 10*3/uL (ref 150–379)
RBC: 4.34 x10E6/uL (ref 3.77–5.28)
RDW: 13.3 % (ref 12.3–15.4)
WBC: 9.3 10*3/uL (ref 3.4–10.8)

## 2016-11-04 LAB — LIPID PANEL
CHOL/HDL RATIO: 3.9 ratio (ref 0.0–4.4)
Cholesterol, Total: 143 mg/dL (ref 100–199)
HDL: 37 mg/dL — AB (ref >39)
LDL CALC: 54 mg/dL (ref 0–99)
TRIGLYCERIDES: 260 mg/dL — AB (ref 0–149)
VLDL Cholesterol Cal: 52 mg/dL — ABNORMAL HIGH (ref 5–40)

## 2016-11-04 LAB — HEMOGLOBIN A1C
Est. average glucose Bld gHb Est-mCnc: 148 mg/dL
Hgb A1c MFr Bld: 6.8 % — ABNORMAL HIGH (ref 4.8–5.6)

## 2016-12-21 ENCOUNTER — Telehealth: Payer: Self-pay | Admitting: Family Medicine

## 2016-12-21 NOTE — Telephone Encounter (Signed)
SMITH - Pt needs a letter sent to [facility] saying thatDelbert Harness it is ok for her to have a MRI even though she has a stint.  This needs to be done asap.  Her appointment for the MRI is Friday morning.  Their fax # is [phone].  She also needs a note for herself "in case this goes to surgery" saying that it is ok to be off plavix for 7 days prior to surgery and 2 days after. This letter can be mailed to her. Her number is [phone].

## 2016-12-22 NOTE — Telephone Encounter (Signed)
Am forwarding to Dr. Hansboro LionsHochrien, as he is patient's cardiologist who manages CAD/stent/Plavix.

## 2016-12-25 NOTE — Telephone Encounter (Signed)
OK for her to have a stent.  I called the patient.

## 2017-01-21 ENCOUNTER — Other Ambulatory Visit: Payer: Self-pay

## 2017-01-21 MED ORDER — DULOXETINE HCL 30 MG PO CPEP: 30.0000 mg | ORAL_CAPSULE | Freq: Every day | ORAL | 0 refills | Status: DC

## 2017-01-21 NOTE — Progress Notes (Unsigned)
Fax request [facility] [address] for 90 day supply Duloxetine HCL.   Pt seen 10/2016 - given 6 mo of refills. Sent 90 days -0- refills

## 2017-02-03 ENCOUNTER — Ambulatory Visit (INDEPENDENT_AMBULATORY_CARE_PROVIDER_SITE_OTHER): Payer: BLUE CROSS/BLUE SHIELD | Admitting: Family Medicine

## 2017-02-03 ENCOUNTER — Encounter: Payer: Self-pay | Admitting: Family Medicine

## 2017-02-03 VITALS — BP 139/83 | HR 82 | Temp 98.2°F | Resp 16 | Ht 67.25 in | Wt 209.6 lb

## 2017-02-03 DIAGNOSIS — E034 Atrophy of thyroid (acquired): Secondary | ICD-10-CM | POA: Diagnosis not present

## 2017-02-03 DIAGNOSIS — M797 Fibromyalgia: Secondary | ICD-10-CM

## 2017-02-03 DIAGNOSIS — I2510 Atherosclerotic heart disease of native coronary artery without angina pectoris: Secondary | ICD-10-CM | POA: Diagnosis not present

## 2017-02-03 DIAGNOSIS — F329 Major depressive disorder, single episode, unspecified: Secondary | ICD-10-CM

## 2017-02-03 DIAGNOSIS — E1159 Type 2 diabetes mellitus with other circulatory complications: Secondary | ICD-10-CM | POA: Diagnosis not present

## 2017-02-03 DIAGNOSIS — E8881 Metabolic syndrome: Secondary | ICD-10-CM

## 2017-02-03 DIAGNOSIS — E7800 Pure hypercholesterolemia, unspecified: Secondary | ICD-10-CM | POA: Diagnosis not present

## 2017-02-03 DIAGNOSIS — F17200 Nicotine dependence, unspecified, uncomplicated: Secondary | ICD-10-CM

## 2017-02-03 MED ORDER — DULOXETINE HCL 60 MG PO CPEP: 60.0000 mg | ORAL_CAPSULE | Freq: Every day | ORAL | 1 refills | Status: DC

## 2017-02-03 NOTE — Progress Notes (Signed)
Subjective:    Patient ID: Anna Noble, female    DOB: 12/10/1955, 61 y.o.   MRN: [id number]  02/03/2017  Follow-up (Diabetes, Discuss Cymbalta)   HPI This 61 y.o. female presents for three month folllow-up of DMII, hypothyroidism, hypertension, fibromyalgia. Switched Citalopram to Cymbalta at last visit for anxiety/depression, fibromyalgia, and chronic pain.   Cymbalta seems to be working; having some depression.  Sadness occurs 3-4 times per week; mild depressive symptoms; down in the dumps.  No side effects.  When taking Cymbalta 30mg  daily with Citalopram 20mg  daily; felt really great; tons of energy; emotionally doing well.   Retired 11/23/2016.  Stayed home for 18 years for speciality needs child.  Then went back to school; associates in accounting.  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   Must pay taxes this year for working for the first two months.  Has [employer] Care currently until age 16.    Sugars doing really well.  Fasting sugars 76; this morning sugar was 128 after dessert last night. Tolerating Glipizide without side effects.  Blood pressure 120s/70s.    Knee is still painful; s/p two steroid injections; s/p MRI knee; cartilage is "shredding".  LEFT knee.  RIGHT knee is good.   Immunization History  Administered Date(s) Administered  . Influenza,inj,Quad PF,36+ Mos 07/31/2013, 12/19/2014, 10/25/2015  . Influenza-Unspecified 06/15/2016  . Pneumococcal Polysaccharide-23 09/28/2010, 10/25/2015  . Tdap 12/19/2014  . Zoster 11/26/2015   BP Readings from Last 3 Encounters:  02/03/17 139/83  11/03/16 137/77  07/29/16 118/72   Wt Readings from Last 3 Encounters:  02/03/17 209 lb 9.6 oz (95.1 kg)  11/03/16 208 lb 12.8 oz (94.7 kg)  07/29/16 209 lb (94.8 kg)    Review of Systems  Constitutional: Negative for chills, diaphoresis, fatigue and fever.  Eyes: Negative for visual disturbance.  Respiratory: Negative for  cough and shortness of breath.   Cardiovascular: Negative for chest pain, palpitations and leg swelling.  Gastrointestinal: Negative for abdominal pain, constipation, diarrhea, nausea and vomiting.  Endocrine: Negative for cold intolerance, heat intolerance, polydipsia, polyphagia and polyuria.  Musculoskeletal: Positive for arthralgias, back pain, gait problem, joint swelling and myalgias.  Neurological: Negative for dizziness, tremors, seizures, syncope, facial asymmetry, speech difficulty, weakness, light-headedness, numbness and headaches.  Psychiatric/Behavioral: Negative for dysphoric mood, self-injury and sleep disturbance. The patient is not nervous/anxious.     Past Medical History:  Diagnosis Date  . Acute myocardial infarction of other anterior wall, episode of care unspecified   . Allergy   . Arthritis   . Coronary atherosclerosis of native coronary artery   . Depression   . DM (diabetes mellitus) (HCC)   . Dysmetabolic syndrome X   . HLD (hyperlipidemia)   . HTN (hypertension)   . Hypothyroidism   . Neuromuscular disorder (HCC)   . Obesity, unspecified   . Tobacco use disorder    Past Surgical History:  Procedure Laterality Date  . ACHILLES TENDON SURGERY Left   . CARDIAC CATHETERIZATION    . Cardiac stenting    . EYE SURGERY    . HAND SURGERY Right    ganglion cyst, bone graft  . KNEE ARTHROSCOPY Right 1976  . STRABISMUS SURGERY Left 1959  . TOE SURGERY Right    x 3  . TUBAL LIGATION     Allergies  Allergen Reactions  . Aspirin   . Latex   . Nsaids   .  Penicillins   . Sulfonamide Derivatives     Social History   Social History  . Marital status: Divorced    Spouse name: N/A  . Number of children: 2  . Years of education: N/A   Occupational History  . employed [employer] at [employer]   Social History Main Topics  . Smoking status: Current Every Day Smoker    Packs/day: 0.50    Years: 30.00    Types: Cigarettes   . Smokeless tobacco: Current User  . Alcohol use 1.2 oz/week    2 Cans of beer per week     Comment: twice a week.  . Drug use: No  . Sexual activity: Yes    Partners: Male    Birth control/ protection: None   Other Topics Concern  . Not on file   Social History Narrative   Marital status: Divorced after 30 years of marriage in 2007.  Dating same gentleman/Frank x 8 years.   Children: 2 children, no grandchildren.  Oldest son with Down's syndrome.     Living: with boyfriend/Frank.   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   Tobacco: 1/2 ppd x 18 years.   Alcohol: 2 drinks on Thursday night/socially.   Drugs: none   Exercise: walking three days per week; work related.   Seatbelt: 100%; no texting      Family History  Problem Relation Age of Onset  . Dementia Mother   . Hypertension Mother   . Diabetes Mother   . Depression Father   . Heart disease Father 48       AMI age 20; CABG.  . Prostate cancer Father   . Hypertension Father   . Cancer Father        prostate cancer  . Hypertension Brother   . Arthritis Brother   . Heart disease Brother 35       AMI  . Diabetes Sister   . Heart disease Sister 68       mild AMI  . Hypertension Sister   . Multiple sclerosis Sister   . Down syndrome Son   . Colon cancer Maternal Aunt 45       Objective:    BP 139/83   Pulse 82   Temp 98.2 F (36.8 C) (Oral)   Resp 16   Ht 5' 7.25" (1.708 m)   Wt 209 lb 9.6 oz (95.1 kg)   SpO2 97%   BMI 32.58 kg/m  Physical Exam  Constitutional: She is oriented to person, place, and time. She appears well-developed and well-nourished. No distress.  HENT:  Head: Normocephalic and atraumatic.  Right Ear: External ear normal.  Left Ear: External ear normal.  Nose: Nose normal.  Mouth/Throat: Oropharynx is clear and moist.  Eyes: Conjunctivae and EOM are normal. Pupils are equal, round, and reactive to light.  Neck: Normal range of motion. Neck supple.  Carotid bruit is not present. No thyromegaly present.  Cardiovascular: Normal rate, regular rhythm, normal heart sounds and intact distal pulses.  Exam reveals no gallop and no friction rub.   No murmur heard. Pulmonary/Chest: Effort normal and breath sounds normal. She has no wheezes. She has no rales.  Abdominal: Soft. Bowel sounds are normal. She exhibits no distension and no mass. There is no tenderness. There is no rebound and no guarding.  Lymphadenopathy:    She has no cervical adenopathy.  Neurological: She is alert and  oriented to person, place, and time. No cranial nerve deficit.  Skin: Skin is warm and dry. No rash noted. She is not diaphoretic. No erythema. No pallor.  Psychiatric: She has a normal mood and affect. Her behavior is normal.   [facility] 02/03/2017 07/29/2016 05/05/2016 04/29/2016 02/25/2016  Decreased Interest 0 0 0 0 1  Down, Depressed, Hopeless 0 0 0 0 0  PHQ - 2 Score 0 0 0 0 1  Altered sleeping - - - - -  Tired, decreased energy - - - - -  Change in appetite - - - - -  Feeling bad or failure about yourself  - - - - -  Trouble concentrating - - - - -  Moving slowly or fidgety/restless - - - - -  Suicidal thoughts - - - - -  PHQ-9 Score - - - - -   Fall Risk  02/03/2017 11/03/2016 07/29/2016 05/05/2016 04/29/2016  Falls in the past year? No No No No Yes  Number falls in past yr: - - - - 1  Injury with Fall? - - - - Yes        Assessment & Plan:   1. Type 2 diabetes mellitus with other circulatory complication, without long-term current use of insulin (HCC)   2. Hypothyroidism due to acquired atrophy of thyroid   3. Reactive depression   4. Pure hypercholesterolemia   5. Atherosclerosis of native coronary artery of native heart without angina pectoris   6. Fibromyalgia   7. TOBACCO USER   8. METABOLIC SYNDROME X    -incresae Cymbalta to 60mg  daily for depression and fibromyalgia. -obtain labs; continue current medications. -encourage smoking  cessation.   Orders Placed This Encounter  Procedures  . CBC with Differential/Platelet  . Comprehensive metabolic panel    Order Specific Question:   Has the patient fasted?    Answer:   Yes  . Hemoglobin A1c  . Lipid panel    Order Specific Question:   Has the patient fasted?    Answer:   Yes   Meds ordered this encounter  Medications  . DULoxetine (CYMBALTA) 60 MG capsule    Sig: Take 1 capsule (60 mg total) by mouth daily. Return to PCP for follow up May 2018    Dispense:  90 capsule    Refill:  1    Return in about 3 months (around 05/06/2017) for complete physical examiniation.   Legacy Lacivita Paulita FujitaMartin Samba Cumba, M.D. [facility] at [facility]  [place] [facility] [address] [place], [place]  [id number] [phone] phone [phone] fax

## 2017-02-03 NOTE — Patient Instructions (Addendum)
   IF you received an x-ray today, you will receive an invoice from [facility]. Please contact  Radiology at [phone] with questions or concerns regarding your invoice.   IF you received labwork today, you will receive an invoice from [facility]. Please contact [facility] at [phone] with questions or concerns regarding your invoice.   Our billing staff will not be able to assist you with questions regarding bills from these companies.  You will be contacted with the lab results as soon as they are available. The fastest way to get your results is to activate your My Chart account. Instructions are located on the last page of this paperwork. If you have not heard from us regarding the results in 2 weeks, please contact this office.      Tobacco Use Disorder Tobacco use disorder (TUD) is a mental disorder. It is the long-term use of tobacco in spite of related health problems or difficulty with normal life activities. Tobacco is most commonly smoked as cigarettes and less commonly as cigars or pipes. Smokeless chewing tobacco and snuff are also popular. People with TUD get a feeling of extreme pleasure (euphoria) from using tobacco and have a desire to use it again and again. Repeated use of tobacco can cause problems. The addictive effects of tobacco are due mainly tothe ingredient nicotine. Nicotine also causes a rush of adrenaline (epinephrine) in the body. This leads to increased blood pressure, heart rate, and breathing rate. These changes may cause problems for people with high blood pressure, weak hearts, or lung disease. High doses of nicotine in children and pets can lead to seizures and death. Tobacco contains a number of other unsafe chemicals. These chemicals are especially harmful when inhaled as smoke and can damage almost every organ in the body. Smokers live shorter lives than nonsmokers and are at risk of dying from a number of diseases and cancers. Tobacco  smoke can also cause health problems for nonsmokers (due to inhaling secondhand smoke). Smoking is also a fire hazard. TUD usually starts in the late teenage years and is most common in young adults between the ages of 18 and 25 years. People who start smoking earlier in life are more likely to continue smoking as adults. TUD is somewhat more common in men than women. People with TUD are at higher risk for using alcohol and other drugs of abuse. What increases the risk? Risk factors for TUD include:  Having family members with the disorder.  Being around people who use tobacco.  Having an existing mental health issue such as schizophrenia, depression, bipolar disorder, ADHD, or posttraumatic stress disorder (PTSD). What are the signs or symptoms? People with tobacco use disorder have two or more of the following signs and symptoms within 12 months:  Use of more tobacco over a longer period than intended.  Not able to cut down or control tobacco use.  A lot of time spent obtaining or using tobacco.  Strong desire or urge to use tobacco (craving). Cravings may last for 6 months or longer after quitting.  Use of tobacco even when use leads to major problems at work, school, or home.  Use of tobacco even when use leads to relationship problems.  Giving up or cutting down on important life activities because of tobacco use.  Repeatedly using tobacco in situations where it puts you or others in physical danger, like smoking in bed.  Use of tobacco even when it is known that a physical or mental problem   is likely related to tobacco use.  Physical problems are numerous and may include chronic bronchitis, emphysema, lung and other cancers, gum disease, high blood pressure, heart disease, and stroke.  Mental problems caused by tobacco may include difficulty sleeping and anxiety.  Need to use greater amounts of tobacco to get the same effect. This means you have developed a  tolerance.  Withdrawal symptoms as a result of stopping or rapidly cutting back use. These symptoms may last a month or more after quitting and include the following:  Depressed, anxious, or irritable mood.  Difficulty concentrating.  Increased appetite.  Restlessness or trouble sleeping.  Use of tobacco to avoid withdrawal symptoms. How is this diagnosed? Tobacco use disorder is diagnosed by your health care provider. A diagnosis may be made by:  Your health care provider asking questions about your tobacco use and any problems it may be causing.  A physical exam.  Lab tests.  You may be referred to a mental health professional or addiction specialist. The severity of tobacco use disorder depends on the number of signs and symptoms you have:  Mild-Two or three symptoms.  Moderate-Four or five symptoms.  Severe-Six or more symptoms. How is this treated? Many people with tobacco use disorder are unable to quit on their own and need help. Treatment options include the following:  Nicotine replacement therapy (NRT). NRT provides nicotine without the other harmful chemicals in tobacco. NRT gradually lowers the dosage of nicotine in the body and reduces withdrawal symptoms. NRT is available in over-the-counter forms (gum, lozenges, and skin patches) as well as prescription forms (mouth inhaler and nasal spray).  Medicines.This may include:  Antidepressant medicine that may reduce nicotine cravings.  A medicine that acts on nicotine receptors in the brain to reduce cravings and withdrawal symptoms. It may also block the effects of tobacco in people with TUD who relapse.  Counseling or talk therapy. A form of talk therapy called behavioral therapy is commonly used to treat people with TUD. Behavioral therapy looks at triggers for tobacco use, how to avoid them, and how to cope with cravings. It is most effective in person or by phone but is also available in self-help forms (books  and Internet websites).  Support groups. These provide emotional support, advice, and guidance for quitting tobacco. The most effective treatment for TUD is usually a combination of medicine, talk therapy, and support groups. Follow these instructions at home:  Keep all follow-up visits as directed by your health care provider. This is important.  Take medicines only as directed by your health care provider.  Check with your health care provider before starting new prescription or over-the-counter medicines. Contact a health care provider if:  You are not able to take your medicines as prescribed.  Treatment is not helping your TUD and your symptoms get worse. Get help right away if:  You have serious thoughts about hurting yourself or others.  You have trouble breathing, chest pain, sudden weakness, or sudden numbness in part of your body. This information is not intended to replace advice given to you by your health care provider. Make sure you discuss any questions you have with your health care provider. Document Released: 05/20/2004 Document Revised: 05/17/2016 Document Reviewed: 11/10/2013 Elsevier Interactive Patient Education  2017 Elsevier Inc.  

## 2017-02-04 LAB — COMPREHENSIVE METABOLIC PANEL
A/G RATIO: 1.6 (ref 1.2–2.2)
ALT: 9 IU/L (ref 0–32)
AST: 15 IU/L (ref 0–40)
Albumin: 4.5 g/dL (ref 3.6–4.8)
Alkaline Phosphatase: 103 IU/L (ref 39–117)
BILIRUBIN TOTAL: 0.2 mg/dL (ref 0.0–1.2)
BUN/Creatinine Ratio: 8 — ABNORMAL LOW (ref 12–28)
BUN: 6 mg/dL — ABNORMAL LOW (ref 8–27)
CALCIUM: 9.5 mg/dL (ref 8.7–10.3)
CO2: 22 mmol/L (ref 18–29)
Chloride: 90 mmol/L — ABNORMAL LOW (ref 96–106)
Creatinine, Ser: 0.75 mg/dL (ref 0.57–1.00)
GFR calc Af Amer: 99 mL/min/{1.73_m2} (ref >59)
GFR, EST NON AFRICAN AMERICAN: 86 mL/min/{1.73_m2} (ref >59)
Globulin, Total: 2.8 g/dL (ref 1.5–4.5)
Glucose: 132 mg/dL — ABNORMAL HIGH (ref 65–99)
POTASSIUM: 4 mmol/L (ref 3.5–5.2)
SODIUM: 133 mmol/L — AB (ref 134–144)
Total Protein: 7.3 g/dL (ref 6.0–8.5)

## 2017-02-04 LAB — CBC WITH DIFFERENTIAL/PLATELET
Basophils Absolute: 0 10*3/uL (ref 0.0–0.2)
Basos: 0 %
EOS (ABSOLUTE): 0.2 10*3/uL (ref 0.0–0.4)
EOS: 2 %
HEMATOCRIT: 38.7 % (ref 34.0–46.6)
Hemoglobin: 13.6 g/dL (ref 11.1–15.9)
Immature Grans (Abs): 0.1 10*3/uL (ref 0.0–0.1)
Immature Granulocytes: 1 %
LYMPHS ABS: 4.4 10*3/uL — AB (ref 0.7–3.1)
Lymphs: 39 %
MCH: 29.9 pg (ref 26.6–33.0)
MCHC: 35.1 g/dL (ref 31.5–35.7)
MCV: 85 fL (ref 79–97)
MONOS ABS: 0.7 10*3/uL (ref 0.1–0.9)
Monocytes: 6 %
NEUTROS PCT: 52 %
Neutrophils Absolute: 5.8 10*3/uL (ref 1.4–7.0)
Platelets: 280 10*3/uL (ref 150–379)
RBC: 4.55 x10E6/uL (ref 3.77–5.28)
RDW: 14.1 % (ref 12.3–15.4)
WBC: 11.2 10*3/uL — AB (ref 3.4–10.8)

## 2017-02-04 LAB — LIPID PANEL
Chol/HDL Ratio: 4.4 ratio (ref 0.0–4.4)
Cholesterol, Total: 189 mg/dL (ref 100–199)
HDL: 43 mg/dL (ref >39)
Triglycerides: 414 mg/dL — ABNORMAL HIGH (ref 0–149)

## 2017-02-04 LAB — HEMOGLOBIN A1C
ESTIMATED AVERAGE GLUCOSE: 154 mg/dL
Hgb A1c MFr Bld: 7 % — ABNORMAL HIGH (ref 4.8–5.6)

## 2017-03-12 ENCOUNTER — Other Ambulatory Visit: Payer: Self-pay | Admitting: Family Medicine

## 2017-03-12 DIAGNOSIS — M797 Fibromyalgia: Secondary | ICD-10-CM

## 2017-03-24 ENCOUNTER — Other Ambulatory Visit: Payer: Self-pay | Admitting: Family Medicine

## 2017-04-16 ENCOUNTER — Telehealth: Payer: Self-pay | Admitting: Family Medicine

## 2017-04-23 ENCOUNTER — Other Ambulatory Visit: Payer: Self-pay | Admitting: Family Medicine

## 2017-04-28 ENCOUNTER — Other Ambulatory Visit: Payer: Self-pay

## 2017-04-28 DIAGNOSIS — E1159 Type 2 diabetes mellitus with other circulatory complications: Secondary | ICD-10-CM

## 2017-04-28 MED ORDER — METFORMIN HCL 1000 MG PO TABS: 1000.0000 mg | ORAL_TABLET | Freq: Two times a day (BID) | ORAL | 1 refills | Status: DC

## 2017-05-19 ENCOUNTER — Encounter: Payer: BLUE CROSS/BLUE SHIELD | Admitting: Family Medicine

## 2017-05-22 ENCOUNTER — Other Ambulatory Visit: Payer: Self-pay | Admitting: Family Medicine

## 2017-05-24 ENCOUNTER — Ambulatory Visit (INDEPENDENT_AMBULATORY_CARE_PROVIDER_SITE_OTHER): Payer: BLUE CROSS/BLUE SHIELD | Admitting: Physician Assistant

## 2017-05-24 ENCOUNTER — Encounter: Payer: Self-pay | Admitting: Physician Assistant

## 2017-05-24 VITALS — BP 130/72 | HR 99 | Temp 97.9°F | Resp 16 | Ht 67.5 in | Wt 209.0 lb

## 2017-05-24 DIAGNOSIS — Z760 Encounter for issue of repeat prescription: Secondary | ICD-10-CM | POA: Diagnosis not present

## 2017-05-24 DIAGNOSIS — L03115 Cellulitis of right lower limb: Secondary | ICD-10-CM

## 2017-05-24 DIAGNOSIS — B353 Tinea pedis: Secondary | ICD-10-CM | POA: Diagnosis not present

## 2017-05-24 MED ORDER — FLUCONAZOLE 150 MG PO TABS: 150.0000 mg | ORAL_TABLET | ORAL | 0 refills | Status: DC

## 2017-05-24 MED ORDER — CLINDAMYCIN HCL 300 MG PO CAPS: 300.0000 mg | ORAL_CAPSULE | Freq: Two times a day (BID) | ORAL | 0 refills | Status: DC

## 2017-05-24 MED ORDER — NITROGLYCERIN 0.4 MG SL SUBL: 0.4000 mg | SUBLINGUAL_TABLET | SUBLINGUAL | 0 refills | Status: DC | PRN

## 2017-05-24 NOTE — Progress Notes (Signed)
05/24/2017 12:30 PM   DOB: [birth year] / MRN: [id number]  SUBJECTIVE:  Anna Noble is a 61 y.o. female presenting for foot pain and swelling that started after a bug bite about 4 days ago.  Assoicates exquisite tenderness about the toe today.   Would like a refill of her nitroglycerine today. Tells me she had an MI 17 years ago and has never needed a nitro, but would like to keep her prescription current. She is compliant with all of her medications.    Immunization History  Administered Date(s) Administered  . Influenza,inj,Quad PF,6+ Mos 07/31/2013, 12/19/2014, 10/25/2015  . Influenza-Unspecified 06/15/2016, 05/10/2017  . Pneumococcal Polysaccharide-23 09/28/2010, 10/25/2015  . Tdap 12/19/2014  . Zoster 11/26/2015     She is allergic to aspirin; latex; nsaids; penicillins; and sulfonamide derivatives.   She  has a past medical history of Acute myocardial infarction of other anterior wall, episode of care unspecified; Allergy; Arthritis; Coronary atherosclerosis of native coronary artery; Depression; DM (diabetes mellitus) (HCC); Dysmetabolic syndrome X; HLD (hyperlipidemia); HTN (hypertension); Hypothyroidism; Neuromuscular disorder (HCC); Obesity, unspecified; and Tobacco use disorder.    She  reports that she has been smoking Cigarettes.  She has a 15.00 pack-year smoking history. She uses smokeless tobacco. She reports that she drinks about 1.2 oz of alcohol per week . She reports that she does not use drugs. She  reports that she currently engages in sexual activity and has had female partners. She reports using the following method of birth control/protection: None. The patient  has a past surgical history that includes Achilles tendon surgery (Left); Knee arthroscopy (Right, 1976); Hand surgery (Right); Strabismus surgery (Left, 1959); Toe Surgery (Right); Cardiac catheterization; Cardiac stenting; Tubal ligation; and Eye surgery.  Her family history includes Arthritis in her  brother; Cancer in her father; Colon cancer (age of onset: 25) in her maternal aunt; Dementia in her mother; Depression in her father; Diabetes in her mother and sister; Down syndrome in her son; Heart disease (age of onset: 50) in her brother; Heart disease (age of onset: 52) in her father; Heart disease (age of onset: 86) in her sister; Hypertension in her brother, father, mother, and sister; Multiple sclerosis in her sister; Prostate cancer in her father.  Review of Systems  Constitutional: Negative for chills, diaphoresis and fever.  Eyes: Negative.   Respiratory: Negative for shortness of breath.   Cardiovascular: Negative for chest pain, orthopnea and leg swelling.  Gastrointestinal: Negative for nausea.  Musculoskeletal: Positive for joint pain.  Skin: Negative for rash.  Neurological: Negative for dizziness, sensory change, speech change, focal weakness and headaches.    The problem list and medications were reviewed and updated by myself where necessary and exist elsewhere in the encounter.   OBJECTIVE:  BP 130/72   Pulse 99   Temp 97.9 F (36.6 C) (Oral)   Resp 16   Ht 5' 7.5" (1.715 m)   Wt 209 lb (94.8 kg)   SpO2 98%   BMI 32.25 kg/m   Physical Exam  Constitutional: She is active.  Non-toxic appearance.  Cardiovascular: Normal rate.   Pulmonary/Chest: Effort normal. No tachypnea.  Musculoskeletal: She exhibits edema and tenderness. She exhibits no deformity.  Neurological: She is alert.  Skin: Skin is warm and dry. She is not diaphoretic. No pallor.       Lab Results  Component Value Date   ALT 9 02/03/2017   AST 15 02/03/2017   ALKPHOS 103 02/03/2017   BILITOT 0.2 02/03/2017  No results found for this or any previous visit (from the past 72 hour(s)).  No results found.  ASSESSMENT AND PLAN:  Vonnetta was seen today for bite and medication refill.  Diagnoses and all orders for this visit:  Cellulitis of right lower extremity: Toe is quite tender  and she complains of redness and warmth radiating to the top of the foot from the toe.  Will go ahead and treat for cellulitis with clindamycin BID as this appears early and she has some untested penicillin allergies (I would have preferred keflex.)  C. Diff symptoms discussed with RTC precautions.  -     clindamycin (CLEOCIN) 300 MG capsule; Take 1 capsule (300 mg total) by mouth 2 (two) times daily.  Tinea pedis of right foot: Most likely leading to problem one.  -     fluconazole (DIFLUCAN) 150 MG tablet; Take 1 tablet (150 mg total) by mouth every 7 (seven) weeks. Repeat if needed  Medication refill: No pain for the last 17 years after cardiology intervention.  Will keep this script current. Dr. Katrinka Blazing is following [facility] chronically.  -     nitroGLYCERIN (NITROSTAT) 0.4 MG SL tablet; Place 1 tablet (0.4 mg total) under the tongue every 5 (five) minutes as needed.    The patient is advised to call or return to clinic if she does not see an improvement in symptoms, or to seek the care of the closest emergency department if she worsens with the above plan.   Deliah Boston, MHS, PA-C Primary Care at [facility] 05/24/2017 12:30 PM

## 2017-05-24 NOTE — Patient Instructions (Addendum)
When you take a diflucan, do not take your statin for two days after you diflucan. Resume you statin after you have missed two doses.     IF you received an x-ray today, you will receive an invoice from [facility]. Please contact [facility] at [phone] with questions or concerns regarding your invoice.   IF you received labwork today, you will receive an invoice from [facility]. Please contact [facility] at [phone] with questions or concerns regarding your invoice.   Our billing staff will not be able to assist you with questions regarding bills from these companies.  You will be contacted with the lab results as soon as they are available. The fastest way to get your results is to activate your My Chart account. Instructions are located on the last page of this paperwork. If you have not heard from [place] regarding the results in 2 weeks, please contact this office.

## 2017-05-26 ENCOUNTER — Ambulatory Visit (INDEPENDENT_AMBULATORY_CARE_PROVIDER_SITE_OTHER): Payer: BLUE CROSS/BLUE SHIELD | Admitting: Family Medicine

## 2017-05-26 ENCOUNTER — Telehealth: Payer: Self-pay | Admitting: Family Medicine

## 2017-05-26 ENCOUNTER — Encounter: Payer: Self-pay | Admitting: Family Medicine

## 2017-05-26 VITALS — BP 148/81 | HR 84 | Temp 98.0°F | Resp 16 | Ht 67.32 in | Wt 206.0 lb

## 2017-05-26 DIAGNOSIS — F17200 Nicotine dependence, unspecified, uncomplicated: Secondary | ICD-10-CM

## 2017-05-26 DIAGNOSIS — E7800 Pure hypercholesterolemia, unspecified: Secondary | ICD-10-CM

## 2017-05-26 DIAGNOSIS — I10 Essential (primary) hypertension: Secondary | ICD-10-CM

## 2017-05-26 DIAGNOSIS — M797 Fibromyalgia: Secondary | ICD-10-CM

## 2017-05-26 DIAGNOSIS — I2510 Atherosclerotic heart disease of native coronary artery without angina pectoris: Secondary | ICD-10-CM | POA: Diagnosis not present

## 2017-05-26 DIAGNOSIS — E1159 Type 2 diabetes mellitus with other circulatory complications: Secondary | ICD-10-CM

## 2017-05-26 DIAGNOSIS — Z1239 Encounter for other screening for malignant neoplasm of breast: Secondary | ICD-10-CM

## 2017-05-26 DIAGNOSIS — E034 Atrophy of thyroid (acquired): Secondary | ICD-10-CM | POA: Diagnosis not present

## 2017-05-26 DIAGNOSIS — R87610 Atypical squamous cells of undetermined significance on cytologic smear of cervix (ASC-US): Secondary | ICD-10-CM

## 2017-05-26 DIAGNOSIS — E8881 Metabolic syndrome: Secondary | ICD-10-CM | POA: Diagnosis not present

## 2017-05-26 DIAGNOSIS — Z1231 Encounter for screening mammogram for malignant neoplasm of breast: Secondary | ICD-10-CM | POA: Diagnosis not present

## 2017-05-26 DIAGNOSIS — F329 Major depressive disorder, single episode, unspecified: Secondary | ICD-10-CM

## 2017-05-26 DIAGNOSIS — Z0000 Encounter for general adult medical examination without abnormal findings: Secondary | ICD-10-CM

## 2017-05-26 MED ORDER — DULOXETINE HCL 60 MG PO CPEP: 120.0000 mg | ORAL_CAPSULE | Freq: Every day | ORAL | 1 refills | Status: DC

## 2017-05-26 MED ORDER — HYDROCODONE-ACETAMINOPHEN 5-325 MG PO TABS: 1.0000 | ORAL_TABLET | Freq: Two times a day (BID) | ORAL | 0 refills | Status: DC | PRN

## 2017-05-26 MED ORDER — ZOSTER VAC RECOMB ADJUVANTED 50 MCG/0.5ML IM SUSR: 0.5000 mL | Freq: Once | INTRAMUSCULAR | 1 refills | Status: AC

## 2017-05-26 NOTE — Progress Notes (Signed)
   Subjective:    Patient ID: Anna Noble, female    DOB: 09-Apr-1956, 61 y.o.   MRN: [id number]  HPI    Review of Systems  HENT: Positive for hearing loss.   Musculoskeletal: Positive for back pain.  Allergic/Immunologic: Positive for environmental allergies.  Hematological: Bruises/bleeds easily.       Objective:   Physical Exam        Assessment & Plan:

## 2017-05-26 NOTE — Patient Instructions (Addendum)
IF you received an x-ray today, you will receive an invoice from [facility]. Please contact [facility] at [phone] with questions or concerns regarding your invoice.   IF you received labwork today, you will receive an invoice from [facility]. Please contact [facility] at [phone] with questions or concerns regarding your invoice.   Our billing staff will not be able to assist you with questions regarding bills from these companies.  You will be contacted with the lab results as soon as they are available. The fastest way to get your results is to activate your My Chart account. Instructions are located on the last page of this paperwork. If you have not heard from [place] regarding the results in 2 weeks, please contact this office.      Preventive Care 40-64 Years, Female Preventive care refers to lifestyle choices and visits with your health care provider that can promote health and wellness. What does preventive care include?  A yearly physical exam. This is also called an annual well check.  Dental exams once or twice a year.  Routine eye exams. Ask your health care provider how often you should have your eyes checked.  Personal lifestyle choices, including: ? Daily care of your teeth and gums. ? Regular physical activity. ? Eating a healthy diet. ? Avoiding tobacco and drug use. ? Limiting alcohol use. ? Practicing safe sex. ? Taking low-dose aspirin daily starting at age 34. ? Taking vitamin and mineral supplements as recommended by your health care provider. What happens during an annual well check? The services and screenings done by your health care provider during your annual well check will depend on your age, overall health, lifestyle risk factors, and family history of disease. Counseling Your health care provider may ask you questions about your:  Alcohol use.  Tobacco use.  Drug use.  Emotional well-being.  Home and relationship  well-being.  Sexual activity.  Eating habits.  Work and work Statistician.  Method of birth control.  Menstrual cycle.  Pregnancy history.  Screening You may have the following tests or measurements:  Height, weight, and BMI.  Blood pressure.  Lipid and cholesterol levels. These may be checked every 5 years, or more frequently if you are over 65 years old.  Skin check.  Lung cancer screening. You may have this screening every year starting at age 102 if you have a 30-pack-year history of smoking and currently smoke or have quit within the past 15 years.  Fecal occult blood test (FOBT) of the stool. You may have this test every year starting at age 33.  Flexible sigmoidoscopy or colonoscopy. You may have a sigmoidoscopy every 5 years or a colonoscopy every 10 years starting at age 23.  Hepatitis C blood test.  Hepatitis B blood test.  Sexually transmitted disease (STD) testing.  Diabetes screening. This is done by checking your blood sugar (glucose) after you have not eaten for a while (fasting). You may have this done every 1-3 years.  Mammogram. This may be done every 1-2 years. Talk to your health care provider about when you should start having regular mammograms. This may depend on whether you have a family history of breast cancer.  BRCA-related cancer screening. This may be done if you have a family history of breast, ovarian, tubal, or peritoneal cancers.  Pelvic exam and Pap test. This may be done every 3 years starting at age 3. Starting at age 55, this may be done every 5 years if  you have a Pap test in combination with an HPV test.  Bone density scan. This is done to screen for osteoporosis. You may have this scan if you are at high risk for osteoporosis.  Discuss your test results, treatment options, and if necessary, the need for more tests with your health care provider. Vaccines Your health care provider may recommend certain vaccines, such  as:  Influenza vaccine. This is recommended every year.  Tetanus, diphtheria, and acellular pertussis (Tdap, Td) vaccine. You may need a Td booster every 10 years.  Varicella vaccine. You may need this if you have not been vaccinated.  Zoster vaccine. You may need this after age 70.  Measles, mumps, and rubella (MMR) vaccine. You may need at least one dose of MMR if you were born in 1957 or later. You may also need a second dose.  Pneumococcal 13-valent conjugate (PCV13) vaccine. You may need this if you have certain conditions and were not previously vaccinated.  Pneumococcal polysaccharide (PPSV23) vaccine. You may need one or two doses if you smoke cigarettes or if you have certain conditions.  Meningococcal vaccine. You may need this if you have certain conditions.  Hepatitis A vaccine. You may need this if you have certain conditions or if you travel or work in places where you may be exposed to hepatitis A.  Hepatitis B vaccine. You may need this if you have certain conditions or if you travel or work in places where you may be exposed to hepatitis B.  Haemophilus influenzae type b (Hib) vaccine. You may need this if you have certain conditions.  Talk to your health care provider about which screenings and vaccines you need and how often you need them. This information is not intended to replace advice given to you by your health care provider. Make sure you discuss any questions you have with your health care provider. Document Released: 10/11/2015 Document Revised: 06/03/2016 Document Reviewed: 07/16/2015 Elsevier Interactive Patient Education  2017 [employer].

## 2017-05-26 NOTE — Progress Notes (Signed)
Subjective:    Patient ID: Anna Noble, female    DOB: [birth year], 61 y.o.   MRN: [id number]  05/26/2017  Annual Exam   HPI This 61 y.o. female presents for Complete Physical Examination.  Last physical: 05-05-16 Pap smear: 04-2016 ASCUS HPV negative. Mammogram:  2016; 2017; [facility].   Colonoscopy:  never Bone density:  never Eye exam:  Glasses; no money yet. Dental exam:  Dentures.  BP Readings from Last 3 Encounters:  05/26/17 (!) 148/81  05/24/17 130/72  02/03/17 139/83   Wt Readings from Last 3 Encounters:  05/26/17 206 lb (93.4 kg)  05/24/17 209 lb (94.8 kg)  02/03/17 209 lb 9.6 oz (95.1 kg)   Immunization History  Administered Date(s) Administered  . Influenza,inj,Quad PF,6+ Mos 07/31/2013, 12/19/2014, 10/25/2015  . Influenza-Unspecified 06/15/2016, 05/10/2017  . Pneumococcal Polysaccharide-23 09/28/2010, 10/25/2015  . Tdap 12/19/2014  . Zoster 11/26/2015   Anxiety an depression: crying spells still; boyfriend with AMI recently.  Taking Cymbalta 60mg  one daily.  LEFT knee: medial OA s/p MRI; medial osteoarthritis.  Also shredding cartilage.    Smoking 1/2 ppd -3/4 ppd.     Review of Systems  Constitutional: Negative for activity change, appetite change, chills, diaphoresis, fatigue, fever and unexpected weight change.  HENT: Positive for hearing loss. Negative for congestion, dental problem, drooling, ear discharge, ear pain, facial swelling, mouth sores, nosebleeds, postnasal drip, rhinorrhea, sinus pressure, sneezing, sore throat, tinnitus, trouble swallowing and voice change.   Eyes: Negative for photophobia, pain, discharge, redness, itching and visual disturbance.  Respiratory: Negative for apnea, cough, choking, chest tightness, shortness of breath, wheezing and stridor.   Cardiovascular: Negative for chest pain, palpitations and leg swelling.  Gastrointestinal: Positive for diarrhea. Negative for abdominal distention, abdominal pain, anal bleeding, blood  in stool, constipation, nausea, rectal pain and vomiting.  Endocrine: Negative for cold intolerance, heat intolerance, polydipsia, polyphagia and polyuria.  Genitourinary: Negative for decreased urine volume, difficulty urinating, dyspareunia, dysuria, enuresis, flank pain, frequency, genital sores, hematuria, menstrual problem, pelvic pain, urgency, vaginal bleeding, vaginal discharge and vaginal pain.       Nocturia x 0.  Urinary leakage sometimes/stress.  Musculoskeletal: Negative for arthralgias, back pain, gait problem, joint swelling, myalgias, neck pain and neck stiffness.  Skin: Negative for color change, pallor, rash and wound.  Allergic/Immunologic: Negative for environmental allergies, food allergies and immunocompromised state.  Neurological: Negative for dizziness, tremors, seizures, syncope, facial asymmetry, speech difficulty, weakness, light-headedness, numbness and headaches.  Hematological: Negative for adenopathy. Does not bruise/bleed easily.  Psychiatric/Behavioral: Positive for dysphoric mood. Negative for agitation, behavioral problems, confusion, decreased concentration, hallucinations, self-injury, sleep disturbance and suicidal ideas. The patient is not nervous/anxious and is not hyperactive.        Bedtime     Past Medical History:  Diagnosis Date  . Acute myocardial infarction of other anterior wall, episode of care unspecified   . Allergy   . Arthritis   . Coronary atherosclerosis of native coronary artery   . Depression   . DM (diabetes mellitus) (HCC)   . Dysmetabolic syndrome X   . HLD (hyperlipidemia)   . HTN (hypertension)   . Hypothyroidism   . Neuromuscular disorder (HCC)   . Obesity, unspecified   . Tobacco use disorder    Past Surgical History:  Procedure Laterality Date  . ACHILLES TENDON SURGERY Left   . CARDIAC CATHETERIZATION    . Cardiac stenting    . EYE SURGERY    . HAND SURGERY Right  ganglion cyst, bone graft  . KNEE ARTHROSCOPY  Right 1976  . STRABISMUS SURGERY Left 1959  . TOE SURGERY Right    x 3  . TUBAL LIGATION     Allergies  Allergen Reactions  . Aspirin   . Latex   . Nsaids   . Penicillins   . Sulfonamide Derivatives    Current Outpatient Prescriptions  Medication Sig Dispense Refill  . carvedilol (COREG) 12.5 MG tablet Take 1 tablet (12.5 mg total) by mouth 2 (two) times daily. 180 tablet 3  . clindamycin (CLEOCIN) 300 MG capsule Take 1 capsule (300 mg total) by mouth 2 (two) times daily. 20 capsule 0  . cyclobenzaprine (FLEXERIL) 5 MG tablet Take 1 tablet (5 mg total) by mouth 3 (three) times daily as needed. 90 tablet 5  . DULoxetine (CYMBALTA) 60 MG capsule Take 2 capsules (120 mg total) by mouth daily. 180 capsule 1  . enalapril (VASOTEC) 10 MG tablet Take 1 tablet (10 mg total) by mouth 2 (two) times daily. 180 tablet 3  . fluconazole (DIFLUCAN) 150 MG tablet Take 1 tablet (150 mg total) by mouth every 7 (seven) weeks. Repeat if needed 4 tablet 0  . gabapentin (NEURONTIN) 300 MG capsule TAKE ONE CAPSULE BY MOUTH IN THE MORNING, ONE BY MOUTH IN THE AFTERNOON, AND TWO AT BEDTIME 120 capsule 3  . glipiZIDE (GLUCOTROL XL) 10 MG 24 hr tablet Take 1 tablet (10 mg total) by mouth daily with breakfast. 90 tablet 1  . hydrochlorothiazide (HYDRODIURIL) 25 MG tablet Take 1 tablet (25 mg total) by mouth daily. 90 tablet 3  . HYDROcodone-acetaminophen (NORCO/VICODIN) 5-325 MG tablet Take 1 tablet by mouth 2 (two) times daily as needed for moderate pain. 30 tablet 0  . levothyroxine (SYNTHROID, LEVOTHROID) 75 MCG tablet Take 0.5 tablets (37.5 mcg total) by mouth daily. 30 tablet 5  . metFORMIN (GLUCOPHAGE) 1000 MG tablet Take 1 tablet (1,000 mg total) by mouth 2 (two) times daily with a meal. 180 tablet 3  . nitroGLYCERIN (NITROSTAT) 0.4 MG SL tablet Place 1 tablet (0.4 mg total) under the tongue every 5 (five) minutes as needed. 10 tablet 0  . simvastatin (ZOCOR) 40 MG tablet Take 1 tablet (40 mg total) by mouth  at bedtime. 90 tablet 3   No current facility-administered medications for this visit.    Social History   Social History  . Marital status: Divorced    Spouse name: Homero Fellers  . Number of children: 2  . Years of education: N/A   Occupational History  . retired [employer] at [employer]   Social History Main Topics  . Smoking status: Current Every Day Smoker    Packs/day: 0.50    Years: 30.00    Types: Cigarettes  . Smokeless tobacco: Current User  . Alcohol use 1.2 oz/week    2 Cans of beer per week     Comment: twice a week.  . Drug use: No  . Sexual activity: Yes    Partners: Male    Birth control/ protection: None   Other Topics Concern  . Not on file   Social History Narrative   Marital status: Divorced after 30 years of marriage in 2007.  Dating same gentleman/Frank x 9 years.   Children: 2 children, no grandchildren.  Oldest son with Down's syndrome.     Living: with boyfriend/Frank.   Employment:  Retired since 10/2016.    Tobacco: 1/2 ppd x 18  years.   Alcohol: 2 drinks on Thursday night/socially.   Drugs: none   Exercise: minimal walking in 2018 due to knee pain/OA.   Seatbelt: 100%; no texting      Family History  Problem Relation Age of Onset  . Dementia Mother   . Hypertension Mother   . Diabetes Mother   . Depression Father   . Heart disease Father [id number]       AMI age 62; CABG.  . Prostate cancer Father   . Hypertension Father   . Cancer Father        prostate cancer  . Hypertension Brother   . Arthritis Brother   . Heart disease Brother 35       AMI  . Diabetes Sister   . Heart disease Sister [id number]       mild AMI  . Hypertension Sister   . Multiple sclerosis Sister   . Down syndrome Son   . Colon cancer Maternal Aunt 45       Objective:    BP (!) 148/81   Pulse 84   Temp 98 F (36.7 C) (Oral)   Resp 16   Ht 5' 7.32" (1.71 m)   Wt 206 lb (93.4 kg)   SpO2 98%   BMI 31.96 kg/m  Physical Exam    Constitutional: She is oriented to person, place, and time. She appears well-developed and well-nourished. No distress.  HENT:  Head: Normocephalic and atraumatic.  Right Ear: External ear normal.  Left Ear: External ear normal.  Nose: Nose normal.  Mouth/Throat: Oropharynx is clear and moist.  Eyes: Pupils are equal, round, and reactive to light. Conjunctivae and EOM are normal.  Neck: Normal range of motion and full passive range of motion without pain. Neck supple. No JVD present. Carotid bruit is not present. No thyromegaly present.  Cardiovascular: Normal rate, regular rhythm and normal heart sounds.  Exam reveals no gallop and no friction rub.   No murmur heard. Pulmonary/Chest: Effort normal and breath sounds normal. She has no wheezes. She has no rales. Right breast exhibits no inverted nipple, no mass, no nipple discharge, no skin change and no tenderness. Left breast exhibits no inverted nipple, no mass, no nipple discharge, no skin change and no tenderness. Breasts are symmetrical.  Abdominal: Soft. Bowel sounds are normal. She exhibits no distension and no mass. There is no tenderness. There is no rebound and no guarding.  Genitourinary: Vagina normal and uterus normal. There is no rash, tenderness or lesion on the right labia. There is no rash, tenderness or lesion on the left labia. Cervix exhibits no motion tenderness, no discharge and no friability. Right adnexum displays no mass, no tenderness and no fullness. Left adnexum displays no mass, no tenderness and no fullness.  Musculoskeletal:       Right shoulder: Normal.       Left shoulder: Normal.       Cervical back: Normal.  Lymphadenopathy:    She has no cervical adenopathy.  Neurological: She is alert and oriented to person, place, and time. She has normal reflexes. No cranial nerve deficit. She exhibits normal muscle tone. Coordination normal.  Skin: Skin is warm and dry. No rash noted. She is not diaphoretic. No erythema.  No pallor.  Psychiatric: She has a normal mood and affect. Her behavior is normal. Judgment and thought content normal.  Nursing note and vitals reviewed.   No results found. [facility] screen [facility] 05/26/2017 05/24/2017 02/03/2017 07/29/2016 05/05/2016  Decreased  Interest 0 0 0 0 0  Down, Depressed, Hopeless 0 0 0 0 0  PHQ - 2 Score 0 0 0 0 0  Altered sleeping - - - - -  Tired, decreased energy - - - - -  Change in appetite - - - - -  Feeling bad or failure about yourself  - - - - -  Trouble concentrating - - - - -  Moving slowly or fidgety/restless - - - - -  Suicidal thoughts - - - - -  PHQ-9 Score - - - - -   Fall Risk  05/26/2017 05/24/2017 02/03/2017 11/03/2016 07/29/2016  Falls in the past year? No No No No No  Comment - - - - -  Number falls in past yr: - - - - -  Injury with Fall? - - - - -  Comment - - - - -        Assessment & Plan:   1. Routine physical examination   2. Breast cancer screening   3. ASCUS of cervix with negative high risk HPV   4. Atherosclerosis of native coronary artery of native heart without angina pectoris   5. Type 2 diabetes mellitus with other circulatory complication, without long-term current use of insulin (HCC)   6. Reactive depression   7. Fibromyalgia   8. METABOLIC SYNDROME X   9. Pure hypercholesterolemia   10. TOBACCO USER   11. Essential hypertension, benign   12. Hypothyroidism due to acquired atrophy of thyroid    -anticipatory guidance provided --- exercise, weight loss, safe driving practices, aspirin 81mg  daily. -obtain age appropriate screening labs and labs for chronic disease management. -repeat pap smear due to ASCUS HPV negative last year on pap smear; current guidelines recommend repeat pap in post-menopausal women. -recommend weight loss, exercise for 30-60 minutes five days per week; recommend 1200 kcal restriction per day with a minimum of 60 grams of protein per day. - I recommend weight loss, exercise, and  low-carbohydrate low-sugar food choices. You should AVOID: regular sodas, sweetened tea, fruit juices.  You should LIMIT: breads, pastas, rice, potatoes, and desserts/sweets.  I would recommend limiting your total carbohydrate intake per meal to 45 grams; I would limit your total carbohydrate intake per snack to 30 grams.  I would also have a goal of 60 grams of protein intake per day; this would equal 10-15 grams of protein per meal and 5-10 grams of protein per snack. -encourage smoking cessation!!!!   Orders Placed This Encounter  Procedures  . MM DIGITAL SCREENING BILATERAL    Standing Status:   Future    Standing Expiration Date:   07/26/2018    Order Specific Question:   Reason for Exam (SYMPTOM  OR DIAGNOSIS REQUIRED)    Answer:   screening for breast cancer    Order Specific Question:   Preferred imaging location?    Answer:   [facility]  . Comprehensive metabolic panel    Order Specific Question:   Has the patient fasted?    Answer:   Yes  . Hemoglobin A1c  . Lipid panel    Order Specific Question:   Has the patient fasted?    Answer:   Yes  . TSH  . Microalbumin, urine  . Urinalysis, Routine w reflex microscopic   Meds ordered this encounter  Medications  . Zoster Vac Recomb Adjuvanted [facility]) injection    Sig: Inject 0.5 mLs into the muscle once.    Dispense:  0.5 mL  Refill:  1  . DULoxetine (CYMBALTA) 60 MG capsule    Sig: Take 2 capsules (120 mg total) by mouth daily.    Dispense:  180 capsule    Refill:  1  . HYDROcodone-acetaminophen (NORCO/VICODIN) 5-325 MG tablet    Sig: Take 1 tablet by mouth 2 (two) times daily as needed for moderate pain.    Dispense:  30 tablet    Refill:  0  . cyclobenzaprine (FLEXERIL) 5 MG tablet    Sig: Take 1 tablet (5 mg total) by mouth 3 (three) times daily as needed.    Dispense:  90 tablet    Refill:  5  . enalapril (VASOTEC) 10 MG tablet    Sig: Take 1 tablet (10 mg total) by mouth 2 (two) times daily.     Dispense:  180 tablet    Refill:  3  . glipiZIDE (GLUCOTROL XL) 10 MG 24 hr tablet    Sig: Take 1 tablet (10 mg total) by mouth daily with breakfast.    Dispense:  90 tablet    Refill:  1  . carvedilol (COREG) 12.5 MG tablet    Sig: Take 1 tablet (12.5 mg total) by mouth 2 (two) times daily.    Dispense:  180 tablet    Refill:  3  . hydrochlorothiazide (HYDRODIURIL) 25 MG tablet    Sig: Take 1 tablet (25 mg total) by mouth daily.    Dispense:  90 tablet    Refill:  3  . levothyroxine (SYNTHROID, LEVOTHROID) 75 MCG tablet    Sig: Take 0.5 tablets (37.5 mcg total) by mouth daily.    Dispense:  30 tablet    Refill:  5  . metFORMIN (GLUCOPHAGE) 1000 MG tablet    Sig: Take 1 tablet (1,000 mg total) by mouth 2 (two) times daily with a meal.    Dispense:  180 tablet    Refill:  3  . simvastatin (ZOCOR) 40 MG tablet    Sig: Take 1 tablet (40 mg total) by mouth at bedtime.    Dispense:  90 tablet    Refill:  3    Return in about 3 months (around 08/26/2017) for recheck diabetes, high blood pressure, high cholesterol.   Makari Sanko Paulita Fujita, M.D. Primary Care at [facility] previously [facility] [address] [place], [place]  [address] [phone] phone [phone] fax

## 2017-05-27 LAB — URINALYSIS, ROUTINE W REFLEX MICROSCOPIC
BILIRUBIN UA: NEGATIVE
Glucose, UA: NEGATIVE
Ketones, UA: NEGATIVE
Leukocytes, UA: NEGATIVE
NITRITE UA: NEGATIVE
PH UA: 7 (ref 5.0–7.5)
PROTEIN UA: NEGATIVE
RBC UA: NEGATIVE
Specific Gravity, UA: 1.008 (ref 1.005–1.030)
UUROB: 0.2 mg/dL (ref 0.2–1.0)

## 2017-05-27 LAB — COMPREHENSIVE METABOLIC PANEL
A/G RATIO: 1.4 (ref 1.2–2.2)
ALT: 9 IU/L (ref 0–32)
AST: 23 IU/L (ref 0–40)
Albumin: 4.2 g/dL (ref 3.6–4.8)
Alkaline Phosphatase: 109 IU/L (ref 39–117)
BUN/Creatinine Ratio: 10 — ABNORMAL LOW (ref 12–28)
BUN: 7 mg/dL — AB (ref 8–27)
Bilirubin Total: 0.2 mg/dL (ref 0.0–1.2)
CALCIUM: 9.4 mg/dL (ref 8.7–10.3)
CO2: 24 mmol/L (ref 20–29)
CREATININE: 0.71 mg/dL (ref 0.57–1.00)
Chloride: 93 mmol/L — ABNORMAL LOW (ref 96–106)
GFR calc Af Amer: 106 mL/min/{1.73_m2} (ref >59)
GFR, EST NON AFRICAN AMERICAN: 92 mL/min/{1.73_m2} (ref >59)
Globulin, Total: 2.9 g/dL (ref 1.5–4.5)
Glucose: 75 mg/dL (ref 65–99)
Potassium: 4.1 mmol/L (ref 3.5–5.2)
Sodium: 134 mmol/L (ref 134–144)
Total Protein: 7.1 g/dL (ref 6.0–8.5)

## 2017-05-27 LAB — HEMOGLOBIN A1C
ESTIMATED AVERAGE GLUCOSE: 151 mg/dL
HEMOGLOBIN A1C: 6.9 % — AB (ref 4.8–5.6)

## 2017-05-27 LAB — LIPID PANEL
Chol/HDL Ratio: 4.1 ratio (ref 0.0–4.4)
Cholesterol, Total: 159 mg/dL (ref 100–199)
HDL: 39 mg/dL — AB (ref >39)
LDL Calculated: 52 mg/dL (ref 0–99)
TRIGLYCERIDES: 340 mg/dL — AB (ref 0–149)
VLDL Cholesterol Cal: 68 mg/dL — ABNORMAL HIGH (ref 5–40)

## 2017-05-27 LAB — MICROALBUMIN, URINE: Microalbumin, Urine: <3 ug/mL

## 2017-05-27 LAB — TSH: TSH: 1.2 u[IU]/mL (ref 0.450–4.500)

## 2017-06-01 LAB — PAP IG AND HPV HIGH-RISK
HPV, high-risk: NEGATIVE
PAP SMEAR COMMENT: 0

## 2017-06-03 ENCOUNTER — Telehealth: Payer: Self-pay

## 2017-06-03 MED ORDER — LEVOTHYROXINE SODIUM 75 MCG PO TABS: 37.5000 ug | ORAL_TABLET | Freq: Every day | ORAL | 5 refills | Status: DC

## 2017-06-03 MED ORDER — ENALAPRIL MALEATE 10 MG PO TABS: 10.0000 mg | ORAL_TABLET | Freq: Two times a day (BID) | ORAL | 3 refills | Status: DC

## 2017-06-03 MED ORDER — HYDROCHLOROTHIAZIDE 25 MG PO TABS: 25.0000 mg | ORAL_TABLET | Freq: Every day | ORAL | 3 refills | Status: DC

## 2017-06-03 MED ORDER — CYCLOBENZAPRINE HCL 5 MG PO TABS: 5.0000 mg | ORAL_TABLET | Freq: Three times a day (TID) | ORAL | 5 refills | Status: DC | PRN

## 2017-06-03 MED ORDER — SIMVASTATIN 40 MG PO TABS: 40.0000 mg | ORAL_TABLET | Freq: Every day | ORAL | 3 refills | Status: DC

## 2017-06-03 MED ORDER — GLIPIZIDE ER 10 MG PO TB24: 10.0000 mg | ORAL_TABLET | Freq: Every day | ORAL | 1 refills | Status: DC

## 2017-06-03 MED ORDER — METFORMIN HCL 1000 MG PO TABS: 1000.0000 mg | ORAL_TABLET | Freq: Two times a day (BID) | ORAL | 3 refills | Status: DC

## 2017-06-03 MED ORDER — CARVEDILOL 12.5 MG PO TABS: 12.5000 mg | ORAL_TABLET | Freq: Two times a day (BID) | ORAL | 3 refills | Status: DC

## 2017-06-03 NOTE — Telephone Encounter (Signed)
[place]Acetaminophen through cover my meds.  Came back with immediate response of Approval from 06-03-2017 until 07-02-2017. Patient notified.

## 2017-06-19 ENCOUNTER — Other Ambulatory Visit: Payer: Self-pay | Admitting: Family Medicine

## 2017-07-05 ENCOUNTER — Telehealth: Payer: Self-pay | Admitting: Family Medicine

## 2017-07-05 NOTE — Telephone Encounter (Signed)
Pt is calling to request a refill of her generic plavix prescription.  Pt still uses the [facility] on [address]. The pharmacy states that we denied the refill.  Please advise. [phone]

## 2017-07-11 ENCOUNTER — Telehealth: Payer: Self-pay | Admitting: Family Medicine

## 2017-07-11 DIAGNOSIS — M797 Fibromyalgia: Secondary | ICD-10-CM

## 2017-07-12 ENCOUNTER — Other Ambulatory Visit: Payer: Self-pay | Admitting: Family Medicine

## 2017-07-12 NOTE — Telephone Encounter (Signed)
PT CALLING FOR A REFILL ON PLAVIX SHE WOULD LIKE THE GENERIC FORM

## 2017-07-12 NOTE — Telephone Encounter (Signed)
See message below- Please advise/refill

## 2017-07-16 NOTE — Telephone Encounter (Signed)
Refilled on 07/12/17

## 2017-07-23 ENCOUNTER — Other Ambulatory Visit: Payer: Self-pay | Admitting: Family Medicine

## 2017-07-24 NOTE — Telephone Encounter (Signed)
Pt is needing a refill on duloxetine   Best number [phone]

## 2017-07-26 NOTE — Telephone Encounter (Signed)
Left detailed VM that pt should not be out of rx at this time. Call back with any concerns.

## 2017-09-15 ENCOUNTER — Other Ambulatory Visit: Payer: Self-pay

## 2017-09-15 ENCOUNTER — Encounter: Payer: Self-pay | Admitting: Family Medicine

## 2017-09-15 ENCOUNTER — Ambulatory Visit: Payer: BLUE CROSS/BLUE SHIELD | Admitting: Family Medicine

## 2017-09-15 VITALS — BP 114/68 | HR 93 | Temp 98.0°F | Resp 16 | Ht 68.11 in | Wt 202.0 lb

## 2017-09-15 DIAGNOSIS — F329 Major depressive disorder, single episode, unspecified: Secondary | ICD-10-CM | POA: Diagnosis not present

## 2017-09-15 DIAGNOSIS — E034 Atrophy of thyroid (acquired): Secondary | ICD-10-CM | POA: Diagnosis not present

## 2017-09-15 DIAGNOSIS — E7800 Pure hypercholesterolemia, unspecified: Secondary | ICD-10-CM

## 2017-09-15 DIAGNOSIS — M797 Fibromyalgia: Secondary | ICD-10-CM

## 2017-09-15 DIAGNOSIS — I2510 Atherosclerotic heart disease of native coronary artery without angina pectoris: Secondary | ICD-10-CM

## 2017-09-15 DIAGNOSIS — E1159 Type 2 diabetes mellitus with other circulatory complications: Secondary | ICD-10-CM

## 2017-09-15 LAB — POCT GLYCOSYLATED HEMOGLOBIN (HGB A1C): HEMOGLOBIN A1C: 7.8

## 2017-09-15 NOTE — Patient Instructions (Addendum)
   IF you received an x-ray today, you will receive an invoice from [facility]. Please contact [facility] at [phone] with questions or concerns regarding your invoice.   IF you received labwork today, you will receive an invoice from [facility]. Please contact [facility] at [phone] with questions or concerns regarding your invoice.   Our billing staff will not be able to assist you with questions regarding bills from these companies.  You will be contacted with the lab results as soon as they are available. The fastest way to get your results is to activate your My Chart account. Instructions are located on the last page of this paperwork. If you have not heard from us regarding the results in 2 weeks, please contact this office.     Diabetes Mellitus and Nutrition When you have diabetes (diabetes mellitus), it is very important to have healthy eating habits because your blood sugar (glucose) levels are greatly affected by what you eat and drink. Eating healthy foods in the appropriate amounts, at about the same times every day, can help you:  Control your blood glucose.  Lower your risk of heart disease.  Improve your blood pressure.  Reach or maintain a healthy weight.  Every person with diabetes is different, and each person has different needs for a meal plan. Your health care provider may recommend that you work with a diet and nutrition specialist (dietitian) to make a meal plan that is best for you. Your meal plan may vary depending on factors such as:  The calories you need.  The medicines you take.  Your weight.  Your blood glucose, blood pressure, and cholesterol levels.  Your activity level.  Other health conditions you have, such as heart or kidney disease.  How do carbohydrates affect me? Carbohydrates affect your blood glucose level more than any other type of food. Eating carbohydrates naturally increases the amount of glucose in your  blood. Carbohydrate counting is a method for keeping track of how many carbohydrates you eat. Counting carbohydrates is important to keep your blood glucose at a healthy level, especially if you use insulin or take certain oral diabetes medicines. It is important to know how many carbohydrates you can safely have in each meal. This is different for every person. Your dietitian can help you calculate how many carbohydrates you should have at each meal and for snack. Foods that contain carbohydrates include:  Bread, cereal, rice, pasta, and crackers.  Potatoes and corn.  Peas, beans, and lentils.  Milk and yogurt.  Fruit and juice.  Desserts, such as cakes, cookies, ice cream, and candy.  How does alcohol affect me? Alcohol can cause a sudden decrease in blood glucose (hypoglycemia), especially if you use insulin or take certain oral diabetes medicines. Hypoglycemia can be a life-threatening condition. Symptoms of hypoglycemia (sleepiness, dizziness, and confusion) are similar to symptoms of having too much alcohol. If your health care provider says that alcohol is safe for you, follow these guidelines:  Limit alcohol intake to no more than 1 drink per day for nonpregnant women and 2 drinks per day for men. One drink equals 12 oz of beer, 5 oz of wine, or 1 oz of hard liquor.  Do not drink on an empty stomach.  Keep yourself hydrated with water, diet soda, or unsweetened iced tea.  Keep in mind that regular soda, juice, and other mixers may contain a lot of sugar and must be counted as carbohydrates.  What are tips for following   this plan? Reading food labels  Start by checking the serving size on the label. The amount of calories, carbohydrates, fats, and other nutrients listed on the label are based on one serving of the food. Many foods contain more than one serving per package.  Check the total grams (g) of carbohydrates in one serving. You can calculate the number of servings of  carbohydrates in one serving by dividing the total carbohydrates by 15. For example, if a food has 30 g of total carbohydrates, it would be equal to 2 servings of carbohydrates.  Check the number of grams (g) of saturated and trans fats in one serving. Choose foods that have low or no amount of these fats.  Check the number of milligrams (mg) of sodium in one serving. Most people should limit total sodium intake to less than 2,300 mg per day.  Always check the nutrition information of foods labeled as "low-fat" or "nonfat". These foods may be higher in added sugar or refined carbohydrates and should be avoided.  Talk to your dietitian to identify your daily goals for nutrients listed on the label. Shopping  Avoid buying canned, premade, or processed foods. These foods tend to be high in fat, sodium, and added sugar.  Shop around the outside edge of the grocery store. This includes fresh fruits and vegetables, bulk grains, fresh meats, and fresh dairy. Cooking  Use low-heat cooking methods, such as baking, instead of high-heat cooking methods like deep frying.  Cook using healthy oils, such as olive, canola, or sunflower oil.  Avoid cooking with butter, cream, or high-fat meats. Meal planning  Eat meals and snacks regularly, preferably at the same times every day. Avoid going long periods of time without eating.  Eat foods high in fiber, such as fresh fruits, vegetables, beans, and whole grains. Talk to your dietitian about how many servings of carbohydrates you can eat at each meal.  Eat 4-6 ounces of lean protein each day, such as lean meat, chicken, fish, eggs, or tofu. 1 ounce is equal to 1 ounce of meat, chicken, or fish, 1 egg, or 1/4 cup of tofu.  Eat some foods each day that contain healthy fats, such as avocado, nuts, seeds, and fish. Lifestyle   Check your blood glucose regularly.  Exercise at least 30 minutes 5 or more days each week, or as told by your health care  provider.  Take medicines as told by your health care provider.  Do not use any products that contain nicotine or tobacco, such as cigarettes and e-cigarettes. If you need help quitting, ask your health care provider.  Work with a counselor or diabetes educator to identify strategies to manage stress and any emotional and social challenges. What are some questions to ask my health care provider?  Do I need to meet with a diabetes educator?  Do I need to meet with a dietitian?  What number can I call if I have questions?  When are the best times to check my blood glucose? Where to find more information:  American Diabetes Association: diabetes.org/food-and-fitness/food  Academy of Nutrition and Dietetics: www.eatright.org/resources/health/diseases-and-conditions/diabetes  National Institute of Diabetes and Digestive and Kidney Diseases (NIH): www.niddk.nih.gov/health-information/diabetes/overview/diet-eating-physical-activity Summary  A healthy meal plan will help you control your blood glucose and maintain a healthy lifestyle.  Working with a diet and nutrition specialist (dietitian) can help you make a meal plan that is best for you.  Keep in mind that carbohydrates and alcohol have immediate effects on your blood glucose   levels. It is important to count carbohydrates and to use alcohol carefully. This information is not intended to replace advice given to you by your health care provider. Make sure you discuss any questions you have with your health care provider. Document Released: 06/11/2005 Document Revised: 10/19/2016 Document Reviewed: 10/19/2016 Elsevier Interactive Patient Education  2018 Elsevier Inc.  

## 2017-09-15 NOTE — Progress Notes (Signed)
Subjective:    Patient ID: Anna Noble, female    DOB: [birth year], 61 y.o.   MRN: [id number]  09/15/2017  Diabetes (follow-up ); Hypothyroidism; Hypertension; Hyperlipidemia; and Coronary Artery Disease    HPI This 61 y.o. female presents for evaluation of DMII, hypertension, hypercholesterolemia, CAD, hypothyroidism, fibromyalgia.    Husband is irritable; short-tempered. Onset since AMI.  Cried last night.  Fearful of death.  Thinks about death everyday.  Pt empathizes.  AAA is really worrying patient.  [place] for aortic ultrasound.  Really irritable when has several things to do in one day.  [place].  Has a dear friend with lung cancer.    Smoking 1/2-1 ppd.  No mammogram.   L osteoarthritis knee: still hurting constantly; went good Friday; MRI; returned Easter Monday; saw Cramer.  OA is moderate.  Cartilage that is shredding; meniscus tear.  Three steroid injections in L knee. When really cold outside, cannot hardly walk.  Horrible pain.  Has not called; wants to avoid surgery.  Blackberry jam sugar free [employer].   BP Readings from Last 3 Encounters:  09/15/17 114/68  05/26/17 (!) 148/81  05/24/17 130/72   Wt Readings from Last 3 Encounters:  09/15/17 202 lb (91.6 kg)  05/26/17 206 lb (93.4 kg)  05/24/17 209 lb (94.8 kg)   Immunization History  Administered Date(s) Administered  . Influenza,inj,Quad PF,6+ Mos 07/31/2013, 12/19/2014, 10/25/2015  . Influenza-Unspecified 06/15/2016, 05/10/2017  . Pneumococcal Polysaccharide-23 09/28/2010, 10/25/2015  . Tdap 12/19/2014  . Zoster 11/26/2015    Review of Systems  Constitutional: Negative for chills, diaphoresis, fatigue and fever.  Eyes: Negative for visual disturbance.  Respiratory: Negative for cough and shortness of breath.   Cardiovascular: Negative for chest pain, palpitations and leg swelling.  Gastrointestinal: Negative for abdominal pain, constipation, diarrhea, nausea and  vomiting.  Endocrine: Negative for cold intolerance, heat intolerance, polydipsia, polyphagia and polyuria.  Musculoskeletal: Positive for arthralgias and gait problem.  Neurological: Negative for dizziness, tremors, seizures, syncope, facial asymmetry, speech difficulty, weakness, light-headedness, numbness and headaches.  Psychiatric/Behavioral: Positive for dysphoric mood.    Past Medical History:  Diagnosis Date  . Acute myocardial infarction of other anterior wall, episode of care unspecified   . Allergy   . Arthritis   . Coronary atherosclerosis of native coronary artery   . Depression   . DM (diabetes mellitus) (HCC)   . Dysmetabolic syndrome X   . HLD (hyperlipidemia)   . HTN (hypertension)   . Hypothyroidism   . Neuromuscular disorder (HCC)   . Obesity, unspecified   . Tobacco use disorder    Past Surgical History:  Procedure Laterality Date  . ACHILLES TENDON SURGERY Left   . CARDIAC CATHETERIZATION    . Cardiac stenting    . EYE SURGERY    . HAND SURGERY Right    ganglion cyst, bone graft  . KNEE ARTHROSCOPY Right 1976  . STRABISMUS SURGERY Left 1959  . TOE SURGERY Right    x 3  . TUBAL LIGATION     Allergies  Allergen Reactions  . Aspirin   . Latex   . Nsaids   . Penicillins   . Sulfonamide Derivatives    Current Outpatient Medications on File Prior to Visit  Medication Sig Dispense Refill  . carvedilol (COREG) 12.5 MG tablet Take 1 tablet (12.5 mg total) by mouth 2 (two) times daily. 180 tablet 3  . clopidogrel (PLAVIX) 75 MG tablet Take 1 tablet (75 mg  total) by mouth daily. 90 tablet 3  . cyclobenzaprine (FLEXERIL) 5 MG tablet Take 1 tablet (5 mg total) by mouth 3 (three) times daily as needed. 90 tablet 5  . DULoxetine (CYMBALTA) 60 MG capsule Take 2 capsules (120 mg total) by mouth daily. 180 capsule 1  . enalapril (VASOTEC) 10 MG tablet Take 1 tablet (10 mg total) by mouth 2 (two) times daily. 180 tablet 3  . fluconazole (DIFLUCAN) 150 MG tablet  Take 1 tablet (150 mg total) by mouth every 7 (seven) weeks. Repeat if needed 4 tablet 0  . FLUZONE QUADRIVALENT 0.5 ML injection   0  . gabapentin (NEURONTIN) 300 MG capsule TAKE ONE CAPSULE BY MOUTH IN THE MORNING, ONE BY MOUTH IN THE AFTERNOON, AND TWO AT BEDTIME 120 capsule 3  . glipiZIDE (GLUCOTROL XL) 10 MG 24 hr tablet Take 1 tablet (10 mg total) by mouth daily with breakfast. 90 tablet 1  . hydrochlorothiazide (HYDRODIURIL) 25 MG tablet Take 1 tablet (25 mg total) by mouth daily. 90 tablet 3  . HYDROcodone-acetaminophen (NORCO/VICODIN) 5-325 MG tablet Take 1 tablet by mouth 2 (two) times daily as needed for moderate pain. 30 tablet 0  . levothyroxine (SYNTHROID, LEVOTHROID) 75 MCG tablet Take 0.5 tablets (37.5 mcg total) by mouth daily. 30 tablet 5  . metFORMIN (GLUCOPHAGE) 1000 MG tablet Take 1 tablet (1,000 mg total) by mouth 2 (two) times daily with a meal. 180 tablet 3  . nitroGLYCERIN (NITROSTAT) 0.4 MG SL tablet Place 1 tablet (0.4 mg total) under the tongue every 5 (five) minutes as needed. 10 tablet 0  . simvastatin (ZOCOR) 40 MG tablet Take 1 tablet (40 mg total) by mouth at bedtime. 90 tablet 3   No current facility-administered medications on file prior to visit.    Social History   Socioeconomic History  . Marital status: Divorced    Spouse name: Homero Fellers  . Number of children: 2  . Years of education: Not on file  . Highest education level: Not on file  Social Needs  . Financial resource strain: Not on file  . Food insecurity - worry: Not on file  . Food insecurity - inability: Not on file  . Transportation needs - medical: Not on file  . Transportation needs - non-medical: Not on file  Occupational History  . 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 Smoking status: Current Every Day Smoker    Packs/day: 0.50    Years: 30.00    Pack years: 15.00    Types: Cigarettes  . Smokeless tobacco:  Current User  Substance and Sexual Activity  . Alcohol use: Yes    Alcohol/week: 1.2 oz    Types: 2 Cans of beer per week    Comment: twice a week.  . Drug use: No  . Sexual activity: Yes    Partners: Male    Birth control/protection: None  Other Topics Concern  . Not on file  Social History Narrative   Marital status: Divorced after 30 years of marriage in 2007.  Dating same gentleman/Frank x 9 years.   Children: 2 children, no grandchildren.  Oldest son with Down's syndrome.     Living: with boyfriend/Frank.   Employment:  Retired since 10/2016.    Tobacco: 1/2 ppd x 18 years.   Alcohol: 2 drinks on Thursday night/socially.   Drugs: none   Exercise: minimal walking in 2018 due to  knee pain/OA.   Seatbelt: 100%; no texting      Family History  Problem Relation Age of Onset  . Dementia Mother   . Hypertension Mother   . Diabetes Mother   . Depression Father   . Heart disease Father [id number]       AMI age 62; CABG.  . Prostate cancer Father   . Hypertension Father   . Cancer Father        prostate cancer  . Hypertension Brother   . Arthritis Brother   . Heart disease Brother 35       AMI  . Diabetes Sister   . Heart disease Sister [id number]       mild AMI  . Hypertension Sister   . Multiple sclerosis Sister   . Down syndrome Son   . Colon cancer Maternal Aunt 45       Objective:    BP 114/68   Pulse 93   Temp 98 F (36.7 C) (Oral)   Resp 16   Ht 5' 8.11" (1.73 m)   Wt 202 lb (91.6 kg)   SpO2 94%   BMI 30.61 kg/m  Physical Exam  Constitutional: She is oriented to person, place, and time. She appears well-developed and well-nourished. No distress.  HENT:  Head: Normocephalic and atraumatic.  Right Ear: External ear normal.  Left Ear: External ear normal.  Nose: Nose normal.  Mouth/Throat: Oropharynx is clear and moist.  Eyes: Conjunctivae and EOM are normal. Pupils are equal, round, and reactive to light.  Neck: Normal range of motion. Neck supple. Carotid bruit  is not present. No thyromegaly present.  Cardiovascular: Normal rate, regular rhythm, normal heart sounds and intact distal pulses. Exam reveals no gallop and no friction rub.  No murmur heard. Pulmonary/Chest: Effort normal and breath sounds normal. She has no wheezes. She has no rales.  Abdominal: Soft. Bowel sounds are normal. She exhibits no distension and no mass. There is no tenderness. There is no rebound and no guarding.  Lymphadenopathy:    She has no cervical adenopathy.  Neurological: She is alert and oriented to person, place, and time. No cranial nerve deficit.  Skin: Skin is warm and dry. No rash noted. She is not diaphoretic. No erythema. No pallor.  Psychiatric: She has a normal mood and affect. Her behavior is normal.   No results found. [facility] screen [facility] 09/15/2017 05/26/2017 05/24/2017 02/03/2017 07/29/2016  Decreased Interest 0 0 0 0 0  Down, Depressed, Hopeless 0 0 0 0 0  PHQ - 2 Score 0 0 0 0 0  Altered sleeping - - - - -  Tired, decreased energy - - - - -  Change in appetite - - - - -  Feeling bad or failure about yourself  - - - - -  Trouble concentrating - - - - -  Moving slowly or fidgety/restless - - - - -  Suicidal thoughts - - - - -  PHQ-9 Score - - - - -   Fall Risk  09/15/2017 05/26/2017 05/24/2017 02/03/2017 11/03/2016  Falls in the past year? No No No No No  Comment - - - - -  Number falls in past yr: - - - - -  Injury with Fall? - - - - -  Comment - - - - -        Assessment & Plan:   1. Type 2 diabetes mellitus with other circulatory complication, without long-term current use of insulin (HCC)  2. Hypothyroidism due to acquired atrophy of thyroid   3. Atherosclerosis of native coronary artery of native heart without angina pectoris   4. Reactive depression   5. Fibromyalgia   6. Pure hypercholesterolemia    Moderately controlled diabetes mellitus type 2.  Obtain labs.  No changes to therapy at this time.  Hemoglobin A1c at 7.8.   I  recommend weight loss, exercise, and low-carbohydrate low-sugar food choices. You should AVOID: regular sodas, sweetened tea, fruit juices.  You should LIMIT: breads, pastas, rice, potatoes, and desserts/sweets.  I would recommend limiting your total carbohydrate intake per meal to 45 grams; I would limit your total carbohydrate intake per snack to 30 grams.  I would also have a goal of 60 grams of protein intake per day; this would equal 10-15 grams of protein per meal and 5-10 grams of protein per snack.  Hypercholesterolemia well controlled.  Obtain labs.  LDL goal less than 70 due to history of coronary artery disease.  No changes to therapy at this time.  Recommend annual follow-up with coronary with cardiology.  Marital strain with has been suffering with worsening anxiety.  Counseling provided.  Continue current medication for anxiety and depression and fibromyalgia.   Orders Placed This Encounter  Procedures  . Comprehensive metabolic panel  . Lipid panel  . POCT glycosylated hemoglobin (Hb A1C)   No orders of the defined types were placed in this encounter.   Return in about 4 months (around 01/14/2018) for follow-up chronic medical conditions.   Jacory Kamel Paulita Fujita, M.D. Primary Care at [facility] [address] [place], [place]  [phone] phone [phone] fax

## 2017-09-16 LAB — COMPREHENSIVE METABOLIC PANEL
ALK PHOS: 115 IU/L (ref 39–117)
ALT: 6 IU/L (ref 0–32)
AST: 13 IU/L (ref 0–40)
Albumin/Globulin Ratio: 1.7 (ref 1.2–2.2)
Albumin: 4.2 g/dL (ref 3.6–4.8)
BILIRUBIN TOTAL: 0.2 mg/dL (ref 0.0–1.2)
BUN/Creatinine Ratio: 13 (ref 12–28)
BUN: 10 mg/dL (ref 8–27)
CHLORIDE: 96 mmol/L (ref 96–106)
CO2: 25 mmol/L (ref 20–29)
Calcium: 9.2 mg/dL (ref 8.7–10.3)
Creatinine, Ser: 0.8 mg/dL (ref 0.57–1.00)
GFR calc Af Amer: 91 mL/min/{1.73_m2} (ref >59)
GFR calc non Af Amer: 79 mL/min/{1.73_m2} (ref >59)
GLUCOSE: 150 mg/dL — AB (ref 65–99)
Globulin, Total: 2.5 g/dL (ref 1.5–4.5)
Potassium: 4.3 mmol/L (ref 3.5–5.2)
Sodium: 139 mmol/L (ref 134–144)
Total Protein: 6.7 g/dL (ref 6.0–8.5)

## 2017-09-16 LAB — LIPID PANEL
CHOLESTEROL TOTAL: 151 mg/dL (ref 100–199)
Chol/HDL Ratio: 4.2 ratio (ref 0.0–4.4)
HDL: 36 mg/dL — AB (ref >39)
LDL Calculated: 63 mg/dL (ref 0–99)
Triglycerides: 259 mg/dL — ABNORMAL HIGH (ref 0–149)
VLDL CHOLESTEROL CAL: 52 mg/dL — AB (ref 5–40)

## 2017-10-24 ENCOUNTER — Encounter (HOSPITAL_COMMUNITY): Payer: Self-pay | Admitting: Nurse Practitioner

## 2017-10-24 ENCOUNTER — Emergency Department (HOSPITAL_COMMUNITY)
Admission: EM | Admit: 2017-10-24 | Discharge: 2017-10-24 | Disposition: A | Discharge status: Home / Self Care | Payer: BLUE CROSS/BLUE SHIELD | Attending: Emergency Medicine | Admitting: Emergency Medicine

## 2017-10-24 DIAGNOSIS — Z7984 Long term (current) use of oral hypoglycemic drugs: Secondary | ICD-10-CM | POA: Insufficient documentation

## 2017-10-24 DIAGNOSIS — Z79899 Other long term (current) drug therapy: Secondary | ICD-10-CM | POA: Diagnosis not present

## 2017-10-24 DIAGNOSIS — E119 Type 2 diabetes mellitus without complications: Secondary | ICD-10-CM | POA: Diagnosis not present

## 2017-10-24 DIAGNOSIS — I2510 Atherosclerotic heart disease of native coronary artery without angina pectoris: Secondary | ICD-10-CM | POA: Insufficient documentation

## 2017-10-24 DIAGNOSIS — F17210 Nicotine dependence, cigarettes, uncomplicated: Secondary | ICD-10-CM | POA: Diagnosis not present

## 2017-10-24 DIAGNOSIS — R05 Cough: Secondary | ICD-10-CM | POA: Insufficient documentation

## 2017-10-24 DIAGNOSIS — N3001 Acute cystitis with hematuria: Secondary | ICD-10-CM | POA: Diagnosis not present

## 2017-10-24 DIAGNOSIS — J40 Bronchitis, not specified as acute or chronic: Secondary | ICD-10-CM

## 2017-10-24 DIAGNOSIS — I252 Old myocardial infarction: Secondary | ICD-10-CM | POA: Insufficient documentation

## 2017-10-24 DIAGNOSIS — R300 Dysuria: Secondary | ICD-10-CM | POA: Diagnosis present

## 2017-10-24 LAB — BASIC METABOLIC PANEL
ANION GAP: 7 (ref 5–15)
BUN: 12 mg/dL (ref 6–20)
CALCIUM: 8.9 mg/dL (ref 8.9–10.3)
CO2: 30 mmol/L (ref 22–32)
Chloride: 97 mmol/L — ABNORMAL LOW (ref 101–111)
Creatinine, Ser: 0.81 mg/dL (ref 0.44–1.00)
GFR calc Af Amer: >60 mL/min (ref >60)
GFR calc non Af Amer: >60 mL/min (ref >60)
GLUCOSE: 61 mg/dL — AB (ref 65–99)
Potassium: 3.8 mmol/L (ref 3.5–5.1)
Sodium: 134 mmol/L — ABNORMAL LOW (ref 135–145)

## 2017-10-24 LAB — CBC
HEMATOCRIT: 38 % (ref 36.0–46.0)
HEMOGLOBIN: 12.4 g/dL (ref 12.0–15.0)
MCH: 29.2 pg (ref 26.0–34.0)
MCHC: 32.6 g/dL (ref 30.0–36.0)
MCV: 89.6 fL (ref 78.0–100.0)
Platelets: 265 10*3/uL (ref 150–400)
RBC: 4.24 MIL/uL (ref 3.87–5.11)
RDW: 13.3 % (ref 11.5–15.5)
WBC: 17.1 10*3/uL — ABNORMAL HIGH (ref 4.0–10.5)

## 2017-10-24 LAB — URINALYSIS, ROUTINE W REFLEX MICROSCOPIC
Bilirubin Urine: NEGATIVE
GLUCOSE, UA: NEGATIVE mg/dL
KETONES UR: NEGATIVE mg/dL
Nitrite: NEGATIVE
PH: 6 (ref 5.0–8.0)
Protein, ur: NEGATIVE mg/dL
SQUAMOUS EPITHELIAL / LPF: NONE SEEN
Specific Gravity, Urine: 1.006 (ref 1.005–1.030)

## 2017-10-24 MED ORDER — DOXYCYCLINE HYCLATE 100 MG PO CAPS: 100.0000 mg | ORAL_CAPSULE | Freq: Two times a day (BID) | ORAL | 0 refills | Status: DC

## 2017-10-24 MED ORDER — NITROFURANTOIN MONOHYD MACRO 100 MG PO CAPS: 100.0000 mg | ORAL_CAPSULE | Freq: Two times a day (BID) | ORAL | 0 refills | Status: DC

## 2017-10-24 NOTE — ED Provider Notes (Signed)
[facility]-EMERGENCY DEPT Provider Note   CSN: [id number] Arrival date & time: 10/24/17  1501     History   Chief Complaint Chief Complaint  Patient presents with  . Dysuria  . URI    HPI Anna Noble is a 62 y.o. female.  She presents for evaluation of urinary symptoms including frequency, dysuria, hematuria, urgency, and incontinence.  The symptoms have been present for 2 days.  She denies fever, chills, back pain or dizziness.  She also complains of cough with sputum production, for 2 weeks.  She recently stopped smoking.  She occasionally produces sputum that is green in color.  She does not have a history of asthma or bronchitis.  There are no other known modifying factors.  HPI  Past Medical History:  Diagnosis Date  . Acute myocardial infarction of other anterior wall, episode of care unspecified   . Allergy   . Arthritis   . Coronary atherosclerosis of native coronary artery   . Depression   . DM (diabetes mellitus) (HCC)   . Dysmetabolic syndrome X   . HLD (hyperlipidemia)   . HTN (hypertension)   . Hypothyroidism   . Neuromuscular disorder (HCC)   . Obesity, unspecified   . Tobacco use disorder     Patient Active Problem List   Diagnosis Date Noted  . Pure hypercholesterolemia 11/03/2016  . Thyroid activity decreased 03/20/2014  . Fibromyalgia 03/20/2014  . Depression 03/20/2014  . TOBACCO USER 02/21/2010  . Diabetes (HCC) 02/19/2010  . METABOLIC SYNDROME X 02/19/2010  . MYOCARDIAL INFARCTION, ANTERIOR WALL 02/19/2010  . CAD, NATIVE VESSEL 02/19/2010    Past Surgical History:  Procedure Laterality Date  . ACHILLES TENDON SURGERY Left   . CARDIAC CATHETERIZATION    . Cardiac stenting    . EYE SURGERY    . HAND SURGERY Right    ganglion cyst, bone graft  . KNEE ARTHROSCOPY Right 1976  . STRABISMUS SURGERY Left 1959  . TOE SURGERY Right    x 3  . TUBAL LIGATION      OB History    No data available       Home  Medications    Prior to Admission medications   Medication Sig Start Date End Date Taking? Authorizing Provider  carvedilol (COREG) 12.5 MG tablet Take 1 tablet (12.5 mg total) by mouth 2 (two) times daily. 06/03/17   Ethelda ChickSmith, Kristi M, MD  clopidogrel (PLAVIX) 75 MG tablet Take 1 tablet (75 mg total) by mouth daily. 07/12/17   Ethelda ChickSmith, Kristi M, MD  cyclobenzaprine (FLEXERIL) 5 MG tablet Take 1 tablet (5 mg total) by mouth 3 (three) times daily as needed. 06/03/17   Ethelda ChickSmith, Kristi M, MD  doxycycline (VIBRAMYCIN) 100 MG capsule Take 1 capsule (100 mg total) by mouth 2 (two) times daily. One po bid x 7 days 10/24/17   Mancel BaleWentz, Farra Nikolic, MD  DULoxetine (CYMBALTA) 60 MG capsule Take 2 capsules (120 mg total) by mouth daily. 05/26/17   Ethelda ChickSmith, Kristi M, MD  enalapril (VASOTEC) 10 MG tablet Take 1 tablet (10 mg total) by mouth 2 (two) times daily. 06/03/17   Ethelda ChickSmith, Kristi M, MD  fluconazole (DIFLUCAN) 150 MG tablet Take 1 tablet (150 mg total) by mouth every 7 (seven) weeks. Repeat if needed 05/24/17   Ofilia Neaslark, Michael L, PA-C  FLUZONE QUADRIVALENT 0.5 ML injection  05/07/17   [provider]  gabapentin (NEURONTIN) 300 MG capsule TAKE ONE CAPSULE BY MOUTH IN THE MORNING, ONE BY MOUTH  IN THE AFTERNOON, AND TWO AT BEDTIME 07/12/17   Ethelda Chick, MD  glipiZIDE (GLUCOTROL XL) 10 MG 24 hr tablet Take 1 tablet (10 mg total) by mouth daily with breakfast. 06/03/17   Ethelda Chick, MD  hydrochlorothiazide (HYDRODIURIL) 25 MG tablet Take 1 tablet (25 mg total) by mouth daily. 06/03/17   Ethelda Chick, MD  HYDROcodone-acetaminophen (NORCO/VICODIN) 5-325 MG tablet Take 1 tablet by mouth 2 (two) times daily as needed for moderate pain. 05/26/17   Ethelda Chick, MD  levothyroxine (SYNTHROID, LEVOTHROID) 75 MCG tablet Take 0.5 tablets (37.5 mcg total) by mouth daily. 06/03/17   Ethelda Chick, MD  metFORMIN (GLUCOPHAGE) 1000 MG tablet Take 1 tablet (1,000 mg total) by mouth 2 (two) times daily with a meal. 06/03/17   Ethelda Chick, MD  nitrofurantoin, macrocrystal-monohydrate, (MACROBID) 100 MG capsule Take 1 capsule (100 mg total) by mouth 2 (two) times daily. X 7 days 10/24/17   Mancel Bale, MD  nitroGLYCERIN (NITROSTAT) 0.4 MG SL tablet Place 1 tablet (0.4 mg total) under the tongue every 5 (five) minutes as needed. 05/24/17   Ofilia Neas, PA-C  simvastatin (ZOCOR) 40 MG tablet Take 1 tablet (40 mg total) by mouth at bedtime. 06/03/17   Ethelda Chick, MD    Family History Family History  Problem Relation Age of Onset  . Dementia Mother   . Hypertension Mother   . Diabetes Mother   . Depression Father   . Heart disease Father 45       AMI age 62; CABG.  . Prostate cancer Father   . Hypertension Father   . Cancer Father        prostate cancer  . Hypertension Brother   . Arthritis Brother   . Heart disease Brother 35       AMI  . Diabetes Sister   . Heart disease Sister 5       mild AMI  . Hypertension Sister   . Multiple sclerosis Sister   . Down syndrome Son   . Colon cancer Maternal Aunt 110    Social History Social History   Tobacco Use  . Smoking status: Current Every Day Smoker    Packs/day: 0.50    Years: 30.00    Pack years: 15.00    Types: Cigarettes  . Smokeless tobacco: Current User  Substance Use Topics  . Alcohol use: Yes    Alcohol/week: 1.2 oz    Types: 2 Cans of beer per week    Comment: twice a week.  . Drug use: No     Allergies   Aspirin; Latex; Nsaids; Penicillins; and Sulfonamide derivatives   Review of Systems Review of Systems  All other systems reviewed and are negative.    Physical Exam Updated Vital Signs BP (!) 142/77 (BP Location: Right Arm)   Pulse 70   Temp 98.2 F (36.8 C) (Oral)   Resp 14   SpO2 98%   Physical Exam  Constitutional: She is oriented to person, place, and time. She appears well-developed and well-nourished.  HENT:  Head: Normocephalic and atraumatic.  Eyes: Conjunctivae and EOM are normal. Pupils are equal,  round, and reactive to light.  Neck: Normal range of motion and phonation normal. Neck supple.  Cardiovascular: Normal rate and regular rhythm.  Pulmonary/Chest: Effort normal and breath sounds normal. No respiratory distress. She exhibits no tenderness.  Scattered rhonchi no wheezes or rales  Abdominal: Soft. She exhibits no distension. There is  no tenderness. There is no guarding.  Genitourinary:  Genitourinary Comments: No costovertebral angle tenderness  Musculoskeletal: Normal range of motion.  Neurological: She is alert and oriented to person, place, and time. She exhibits normal muscle tone.  Skin: Skin is warm and dry.  Psychiatric: She has a normal mood and affect. Her behavior is normal. Judgment and thought content normal.  Nursing note and vitals reviewed.    ED Treatments / Results  Labs (all labs ordered are listed, but only abnormal results are displayed) Labs Reviewed  URINALYSIS, ROUTINE W REFLEX MICROSCOPIC - Abnormal; Notable for the following components:      Result Value   Hgb urine dipstick MODERATE (*)    Leukocytes, UA LARGE (*)    Bacteria, UA RARE (*)    All other components within normal limits  BASIC METABOLIC PANEL - Abnormal; Notable for the following components:   Sodium 134 (*)    Chloride 97 (*)    Glucose, Bld 61 (*)    All other components within normal limits  CBC - Abnormal; Notable for the following components:   WBC 17.1 (*)    All other components within normal limits  URINE CULTURE    EKG  EKG Interpretation None       Radiology No results found.  Procedures Procedures (including critical care time)  Medications Ordered in ED Medications - No data to display   Initial Impression / Assessment and Plan / ED Course  I have reviewed the triage vital signs and the nursing notes.  Pertinent labs & imaging results that were available during my care of the patient were reviewed by me and considered in my medical decision making  (see chart for details).      Patient Vitals for the past 24 hrs:  BP Temp Temp src Pulse Resp SpO2  10/24/17 1921 (!) 142/77 - - 70 14 98 %  10/24/17 1540 107/68 98.2 F (36.8 C) Oral 84 16 98 %    7:08 PM Reevaluation with update and discussion. After initial assessment and treatment, an updated evaluation reveals no change in clinical status.  Findings discussed with patient and all questions were answered. Mancel Bale     Final Clinical Impressions(s) / ED Diagnoses   Final diagnoses:  Acute cystitis with hematuria  Bronchitis    Evaluation consistent with bronchitis, with recent tobacco use cessation.  Doubt pneumonia, metabolic instability or impending vascular collapse.  Evaluation also consistent with acute cystitis, requiring treatment with antibiotic.  Patient has penicillin and sulfa allergies, therefore will require 2 medications, for antibiotic treatment.  Nursing Notes Reviewed/ Care Coordinated Applicable Imaging Reviewed Interpretation of Laboratory Data incorporated into ED treatment  The patient appears reasonably screened and/or stabilized for discharge and I doubt any other medical condition or other [facility] requiring further screening, evaluation, or treatment in the ED at this time prior to discharge.  Plan: Home Medications-continue routine medications; Home Treatments-continue to avoid tobacco; return here if the recommended treatment, does not improve the symptoms; Recommended follow up-PCP checkup 1 week.   ED Discharge Orders        Ordered    doxycycline (VIBRAMYCIN) 100 MG capsule  2 times daily     10/24/17 1912    nitrofurantoin, macrocrystal-monohydrate, (MACROBID) 100 MG capsule  2 times daily     10/24/17 1912       Mancel Bale, MD 10/24/17 2118

## 2017-10-24 NOTE — ED Triage Notes (Signed)
Pt states she suspects she has a UTI, she is c/o dysuria, urgency and frequency. She adds that she has been having symptoms of a head cold that have been ongoing for a week and a half.

## 2017-10-26 LAB — URINE CULTURE: CULTURE: NO GROWTH

## 2017-11-15 ENCOUNTER — Ambulatory Visit: Payer: BLUE CROSS/BLUE SHIELD | Admitting: Physician Assistant

## 2017-11-15 ENCOUNTER — Ambulatory Visit: Payer: BLUE CROSS/BLUE SHIELD

## 2017-11-15 ENCOUNTER — Encounter: Payer: Self-pay | Admitting: Physician Assistant

## 2017-11-15 ENCOUNTER — Other Ambulatory Visit: Payer: Self-pay

## 2017-11-15 VITALS — BP 130/70 | HR 93 | Temp 98.3°F | Resp 16 | Ht 67.0 in | Wt 207.2 lb

## 2017-11-15 DIAGNOSIS — Z87891 Personal history of nicotine dependence: Secondary | ICD-10-CM

## 2017-11-15 DIAGNOSIS — R059 Cough, unspecified: Secondary | ICD-10-CM

## 2017-11-15 DIAGNOSIS — J209 Acute bronchitis, unspecified: Secondary | ICD-10-CM

## 2017-11-15 DIAGNOSIS — R319 Hematuria, unspecified: Secondary | ICD-10-CM | POA: Diagnosis not present

## 2017-11-15 DIAGNOSIS — R05 Cough: Secondary | ICD-10-CM

## 2017-11-15 DIAGNOSIS — N390 Urinary tract infection, site not specified: Secondary | ICD-10-CM

## 2017-11-15 LAB — POCT CBC
Granulocyte percent: 50.6 %G (ref 37–80)
HEMATOCRIT: 40.1 % (ref 37.7–47.9)
Hemoglobin: 13.2 g/dL (ref 12.2–16.2)
LYMPH, POC: 4.7 — AB (ref 0.6–3.4)
MCH, POC: 29 pg (ref 27–31.2)
MCHC: 33 g/dL (ref 31.8–35.4)
MCV: 89.9 fL (ref 80–97)
MID (CBC): 1.1 — AB (ref 0–0.9)
MPV: 7.1 fL (ref 0–99.8)
POC GRANULOCYTE: 5.9 (ref 2–6.9)
POC LYMPH %: 40.2 % (ref 10–50)
POC MID %: 9.2 % (ref 0–12)
Platelet Count, POC: 295 10*3/uL (ref 142–424)
RBC: 4.56 M/uL (ref 4.04–5.48)
RDW, POC: 14 %
WBC: 11.6 10*3/uL — AB (ref 4.6–10.2)

## 2017-11-15 MED ORDER — BENZONATATE 100 MG PO CAPS: 100.0000 mg | ORAL_CAPSULE | Freq: Three times a day (TID) | ORAL | 0 refills | Status: DC | PRN

## 2017-11-15 MED ORDER — IPRATROPIUM BROMIDE 0.02 % IN SOLN
0.5000 mg | Freq: Once | RESPIRATORY_TRACT | Status: AC
  Administered 2017-11-15: 0.5 mg via RESPIRATORY_TRACT

## 2017-11-15 MED ORDER — ALBUTEROL SULFATE (2.5 MG/3ML) 0.083% IN NEBU
2.5000 mg | INHALATION_SOLUTION | Freq: Once | RESPIRATORY_TRACT | Status: AC
  Administered 2017-11-15: 2.5 mg via RESPIRATORY_TRACT

## 2017-11-15 MED ORDER — PREDNISONE 20 MG PO TABS: 40.0000 mg | ORAL_TABLET | Freq: Every day | ORAL | 0 refills | Status: AC

## 2017-11-15 MED ORDER — ALBUTEROL SULFATE HFA 108 (90 BASE) MCG/ACT IN AERS: 2.0000 | INHALATION_SPRAY | RESPIRATORY_TRACT | 0 refills | Status: AC | PRN

## 2017-11-15 NOTE — Progress Notes (Signed)
MRN: [id number] DOB: 09-26-1956  Subjective:   Anna Noble is a 62 y.o. female presenting for chief complaint of Cough (green mucus x 3 wks, treated 2 wks ago at [facility] for bronchitis and UTI) .  Reports 3 week history of dry cough. Will have wheezing at night time. Has some runny nose as well.  Seen in ED on 10/24/17 and treated for bronchitis and UTI with doxycycline and macrobid on 10/24/17. Has tried cordicidin and zyrtec as well with no full relief.  Denies fever, sinus pain, ear pain, sore throat, shortness of breath and chest pain, night sweats, chills, fatigue, nausea, vomiting and diarrhea. Has had  sick contact with boyfriend. Has history of seasonal allergies, no history of asthma or COPD.  Patient has flu shot this season. Former smoker, recently quit smoking in 09/2017. Prior to that, smoked 0.5-1 ppd x 40+ years. Of note, UTI sx have resolved. Denies dysuria, urinary frequency, urinary urgency, hematuria, and flank pain.  Anna Noble has a current medication list which includes the following prescription(s): carvedilol, clopidogrel, cyclobenzaprine, duloxetine, enalapril, fluzone quadrivalent, gabapentin, glipizide, hydrochlorothiazide, hydrocodone-acetaminophen, levothyroxine, metformin, nitroglycerin, and simvastatin, and the following Facility-Administered Medications: albuterol and ipratropium. Also is allergic to aspirin; latex; nsaids; penicillins; and sulfonamide derivatives.  Anna Noble  has a past medical history of Acute myocardial infarction of other anterior wall, episode of care unspecified, Allergy, Arthritis, Coronary atherosclerosis of native coronary artery, Depression, DM (diabetes mellitus) (HCC), Dysmetabolic syndrome X, HLD (hyperlipidemia), HTN (hypertension), Hypothyroidism, Neuromuscular disorder (HCC), Obesity, unspecified, and Tobacco use disorder. Also  has a past surgical history that includes Achilles tendon surgery (Left); Knee arthroscopy (Right, 1976); Hand  surgery (Right); Strabismus surgery (Left, 1959); Toe Surgery (Right); Cardiac catheterization; Cardiac stenting; Tubal ligation; and Eye surgery.   Objective:   Vitals: BP 130/70   Pulse 93   Temp 98.3 F (36.8 C) (Oral)   Resp 16   Ht 5\' 7"  (1.702 m)   Wt 207 lb 3.2 oz (94 kg)   SpO2 96%   BMI 32.45 kg/m   Physical Exam  Constitutional: She is oriented to person, place, and time. She appears well-developed and well-nourished. No distress.  HENT:  Head: Normocephalic and atraumatic.  Right Ear: Tympanic membrane, external ear and ear canal normal.  Left Ear: Tympanic membrane, external ear and ear canal normal.  Nose: Mucosal edema present. Right sinus exhibits no maxillary sinus tenderness and no frontal sinus tenderness. Left sinus exhibits no maxillary sinus tenderness and no frontal sinus tenderness.  Mouth/Throat: Uvula is midline, oropharynx is clear and moist and mucous membranes are normal. No tonsillar exudate.  Eyes: Conjunctivae are normal.  Neck: Normal range of motion.  Cardiovascular: Normal rate, regular rhythm and normal heart sounds.  Pulmonary/Chest: Effort normal. She has no decreased breath sounds. She has wheezes (few intermittent wheezes). She has rhonchi ( diffuse throughout bilateral lung fields, most notable at RUF and RMF). She has no rales.  Lymphadenopathy:       Head (right side): No submental, no submandibular, no tonsillar, no preauricular, no posterior auricular and no occipital adenopathy present.       Head (left side): No submental, no submandibular, no tonsillar, no preauricular, no posterior auricular and no occipital adenopathy present.    She has no cervical adenopathy.       Right: No supraclavicular adenopathy present.       Left: No supraclavicular adenopathy present.  Neurological: She is alert and oriented to person, place, and  time.  Skin: Skin is warm and dry.  Psychiatric: She has a normal mood and affect.  Vitals  reviewed.   Results for orders placed or performed in visit on 11/15/17 (from the past 24 hour(s))  POCT CBC     Status: Abnormal   Collection Time: 11/15/17  4:53 PM  Result Value Ref Range   WBC 11.6 (A) 4.6 - 10.2 K/uL   Lymph, poc 4.7 (A) 0.6 - 3.4   POC LYMPH PERCENT 40.2 10 - 50 %L   MID (cbc) 1.1 (A) 0 - 0.9   POC MID % 9.2 0 - 12 %M   POC Granulocyte 5.9 2 - 6.9   Granulocyte percent 50.6 37 - 80 %G   RBC 4.56 4.04 - 5.48 M/uL   Hemoglobin 13.2 12.2 - 16.2 g/dL   HCT, POC 16.140.1 09.637.7 - 47.9 %   MCV 89.9 80 - 97 fL   MCH, POC 29.0 27 - 31.2 pg   MCHC 33.0 31.8 - 35.4 g/dL   RDW, POC 04.514.0 %   Platelet Count, POC 295 142 - 424 K/uL   MPV 7.1 0 - 99.8 fL   Post DuoNeb, patient reports relief in symptoms.  Lungs are CTAB.   Assessment and Plan :  This case was precepted.with Dr. Katrinka BlazingSmith.  1. Cough - albuterol (PROVENTIL) (2.5 MG/3ML) 0.083% nebulizer solution 2.5 mg - ipratropium (ATROVENT) nebulizer solution 0.5 mg - POCT CBC 2. Acute bronchitis, unspecified organism Patient has completed course of doxycycline for symptoms.  Still having cough and occassional wheezing despite abx.  Today, symptoms resolved with DuoNeb in office.  She is overall well-appearing, no distress.  Vitals stable.  She is afebrile.  Recommend short prednisone course and albuterol inhaler as needed every 4-6 hours for cough and wheezing.  Advised to return to clinic if symptoms worsen, do not improve in 5-7 days, or as needed.  Consider chest x-ray at that time if symptoms persist. - predniSONE (DELTASONE) 20 MG tablet; Take 2 tablets (40 mg total) by mouth daily with breakfast for 5 days.  Dispense: 10 tablet; Refill: 0 - albuterol (PROVENTIL HFA;VENTOLIN HFA) 108 (90 Base) MCG/ACT inhaler; Inhale 2 puffs into the lungs every 4 (four) hours as needed for wheezing or shortness of breath (cough, shortness of breath or wheezing.).  Dispense: 1 Inhaler; Refill: 0 - benzonatate (TESSALON) 100 MG capsule; Take  1-2 capsules (100-200 mg total) by mouth 3 (three) times daily as needed for cough.  Dispense: 40 capsule; Refill: 0  3. 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 Encouraged to continue not smoking. Given educational material on smoking cessation.   5. Urinary tract infection with hematuria, site unspecified Resolved. Pt is asymptomatic.   Benjiman CoreBrittany Faruq Rosenberger, PA-C  Primary Care at [facility] 11/15/2017 4:54 PM

## 2017-11-15 NOTE — Patient Instructions (Addendum)
We are going to treat your underlying inflammation with oral prednisone. Prednisone is a steroid and can cause side effects such as headache, irritability, nausea, vomiting, increased heart rate, increased blood pressure, increased blood sugar, appetite changes, and insomnia. Please take tablets in the morning with a full meal to help decrease the chances of these side effects.   You may also use albuterol inhaler every 4-6 hours as needed for wheezing.  I have also given a prescription for Tessalon Perles to help suppress the cough.  Please return to clinic if symptoms worsen, do not improve, or as needed.  Acute Bronchitis, Adult Acute bronchitis is when air tubes (bronchi) in the lungs suddenly get swollen. The condition can make it hard to breathe. It can also cause these symptoms:  A cough.  Coughing up clear, yellow, or green mucus.  Wheezing.  Chest congestion.  Shortness of breath.  A fever.  Body aches.  Chills.  A sore throat.  Follow these instructions at home: Medicines  Take over-the-counter and prescription medicines only as told by your doctor.  If you were prescribed an antibiotic medicine, take it as told by your doctor. Do not stop taking the antibiotic even if you start to feel better. General instructions  Rest.  Drink enough fluids to keep your pee (urine) clear or pale yellow.  Avoid smoking and secondhand smoke. If you smoke and you need help quitting, ask your doctor. Quitting will help your lungs heal faster.  Use an inhaler, cool mist vaporizer, or humidifier as told by your doctor.  Keep all follow-up visits as told by your doctor. This is important. How is this prevented? To lower your risk of getting this condition again:  Wash your hands often with soap and water. If you cannot use soap and water, use hand sanitizer.  Avoid contact with people who have cold symptoms.  Try not to touch your hands to your mouth, nose, or eyes.  Make  sure to get the flu shot every year.  Contact a doctor if:  Your symptoms do not get better in 2 weeks. Get help right away if:  You cough up blood.  You have chest pain.  You have very bad shortness of breath.  You become dehydrated.  You faint (pass out) or keep feeling like you are going to pass out.  You keep throwing up (vomiting).  You have a very bad headache.  Your fever or chills gets worse. This information is not intended to replace advice given to you by your health care provider. Make sure you discuss any questions you have with your health care provider. Document Released: 03/02/2008 Document Revised: 04/22/2016 Document Reviewed: 03/04/2016 Elsevier Interactive Patient Education  2018 [employer]    IF you received an x-ray today, you will receive an invoice from [facility]. Please contact [facility] at [phone] with questions or concerns regarding your invoice.   IF you received labwork today, you will receive an invoice from [facility]. Please contact [facility] at [phone] with questions or concerns regarding your invoice.   Our billing staff will not be able to assist you with questions regarding bills from these companies.  You will be contacted with the lab results as soon as they are available. The fastest way to get your results is to activate your My Chart account. Instructions are located on the last page of this paperwork. If you have not heard from us regarding the results in 2 weeks, please contact this office.  Patient has already completed a course of doxycycline

## 2017-12-12 ENCOUNTER — Other Ambulatory Visit: Payer: Self-pay | Admitting: Physician Assistant

## 2017-12-12 DIAGNOSIS — J209 Acute bronchitis, unspecified: Secondary | ICD-10-CM

## 2018-01-08 ENCOUNTER — Other Ambulatory Visit: Payer: Self-pay | Admitting: Family Medicine

## 2018-01-08 DIAGNOSIS — M797 Fibromyalgia: Secondary | ICD-10-CM

## 2018-01-10 ENCOUNTER — Other Ambulatory Visit: Payer: Self-pay

## 2018-01-10 DIAGNOSIS — M797 Fibromyalgia: Secondary | ICD-10-CM

## 2018-01-10 MED ORDER — GABAPENTIN 300 MG PO CAPS: ORAL_CAPSULE | ORAL | 1 refills | Status: DC

## 2018-01-10 NOTE — Telephone Encounter (Signed)
gabapentin refill Last OV: 09/15/17 has upcoming OV 01/19/18 Last Refill:07/12/17 #120 3 RF Pharmacy:[facility] [address] PCP: Dr Nilda SimmerKristi Smith

## 2018-01-19 ENCOUNTER — Other Ambulatory Visit: Payer: Self-pay

## 2018-01-19 ENCOUNTER — Encounter: Payer: Self-pay | Admitting: Family Medicine

## 2018-01-19 ENCOUNTER — Ambulatory Visit: Payer: BLUE CROSS/BLUE SHIELD | Admitting: Family Medicine

## 2018-01-19 ENCOUNTER — Other Ambulatory Visit: Payer: Self-pay | Admitting: Family Medicine

## 2018-01-19 VITALS — BP 116/68 | HR 85 | Temp 98.0°F | Resp 16 | Ht 67.32 in | Wt 207.0 lb

## 2018-01-19 DIAGNOSIS — E8881 Metabolic syndrome: Secondary | ICD-10-CM

## 2018-01-19 DIAGNOSIS — I10 Essential (primary) hypertension: Secondary | ICD-10-CM | POA: Diagnosis not present

## 2018-01-19 DIAGNOSIS — F329 Major depressive disorder, single episode, unspecified: Secondary | ICD-10-CM

## 2018-01-19 DIAGNOSIS — I2510 Atherosclerotic heart disease of native coronary artery without angina pectoris: Secondary | ICD-10-CM

## 2018-01-19 DIAGNOSIS — E7800 Pure hypercholesterolemia, unspecified: Secondary | ICD-10-CM | POA: Diagnosis not present

## 2018-01-19 DIAGNOSIS — E1159 Type 2 diabetes mellitus with other circulatory complications: Secondary | ICD-10-CM | POA: Diagnosis not present

## 2018-01-19 DIAGNOSIS — E034 Atrophy of thyroid (acquired): Secondary | ICD-10-CM | POA: Diagnosis not present

## 2018-01-19 DIAGNOSIS — M797 Fibromyalgia: Secondary | ICD-10-CM | POA: Diagnosis not present

## 2018-01-19 LAB — POCT GLYCOSYLATED HEMOGLOBIN (HGB A1C): HEMOGLOBIN A1C: 7.2

## 2018-01-19 MED ORDER — CYCLOBENZAPRINE HCL 5 MG PO TABS: 5.0000 mg | ORAL_TABLET | Freq: Three times a day (TID) | ORAL | 5 refills | Status: DC | PRN

## 2018-01-19 MED ORDER — GLIPIZIDE ER 10 MG PO TB24: 10.0000 mg | ORAL_TABLET | Freq: Every day | ORAL | 1 refills | Status: DC

## 2018-01-19 MED ORDER — GABAPENTIN 300 MG PO CAPS: 300.0000 mg | ORAL_CAPSULE | Freq: Four times a day (QID) | ORAL | 1 refills | Status: DC

## 2018-01-19 MED ORDER — LEVOTHYROXINE SODIUM 75 MCG PO TABS: 75.0000 ug | ORAL_TABLET | Freq: Every day | ORAL | 3 refills | Status: DC

## 2018-01-19 MED ORDER — HYDROCODONE-ACETAMINOPHEN 5-325 MG PO TABS: 1.0000 | ORAL_TABLET | Freq: Two times a day (BID) | ORAL | 0 refills | Status: DC | PRN

## 2018-01-19 MED ORDER — DULOXETINE HCL 60 MG PO CPEP: 120.0000 mg | ORAL_CAPSULE | Freq: Every day | ORAL | 1 refills | Status: DC

## 2018-01-19 NOTE — Progress Notes (Signed)
Subjective:    Patient ID: Anna Noble, female    DOB: 05/03/1956, 62 y.o.   MRN: [id number]  01/19/2018  Chronic Conditions (4 month follow-up )    HPI This 62 y.o. female presents for four month follow-up of DMII, hypertension, hypercholesterolemia, anxiety/depression, fibromyalgia, CAD. Management changes made at last visit include the following:  Moderately controlled diabetes mellitus type 2.  Obtain labs.  No changes to therapy at this time.  Hemoglobin A1c at 7.8.   I recommend weight loss, exercise, and low-carbohydrate low-sugar food choices. You should AVOID: regular sodas, sweetened tea, fruit juices.  You should LIMIT: breads, pastas, rice, potatoes, and desserts/sweets.  I would recommend limiting your total carbohydrate intake per meal to 45 grams; I would limit your total carbohydrate intake per snack to 30 grams.  I would also have a goal of 60 grams of protein intake per day; this would equal 10-15 grams of protein per meal and 5-10 grams of protein per snack.  Hypercholesterolemia well controlled.  Obtain labs.  LDL goal less than 70 due to history of coronary artery disease.  No changes to therapy at this time.  Recommend annual follow-up with cardiology.  Marital strain with has been suffering with worsening anxiety.  Counseling provided.  Continue current medication for anxiety and depression and fibromyalgia.  UPDATE: Checking sugar at home: 189.  Eating a lot of sugar. Quit smoking in 10/07/17.  Has gained five pounds in three months. Trying to lose weight. Today BP 112/70s.  Sometimes will go low.  Rare.   COLOGUARD SITTING IN CABINET. Last eye exam when gets paid.   No eye exam in two years.   BP Readings from Last 3 Encounters:  01/19/18 116/68  11/15/17 130/70  10/24/17 (!) 142/77   Wt Readings from Last 3 Encounters:  01/19/18 207 lb (93.9 kg)  11/15/17 207 lb 3.2 oz (94 kg)  09/15/17 202 lb (91.6 kg)   Immunization History  Administered  Date(s) Administered  . Influenza,inj,Quad PF,6+ Mos 07/31/2013, 12/19/2014, 10/25/2015, 05/07/2017  . Influenza-Unspecified 06/15/2016, 05/10/2017  . Pneumococcal Polysaccharide-23 09/28/2010, 10/25/2015  . Tdap 12/19/2014  . Zoster 11/26/2015    Review of Systems  Constitutional: Negative for chills, diaphoresis, fatigue and fever.  HENT: Negative for congestion, postnasal drip, rhinorrhea and sore throat.   Eyes: Negative for visual disturbance.  Respiratory: Negative for cough and shortness of breath.   Cardiovascular: Negative for chest pain, palpitations and leg swelling.  Gastrointestinal: Negative for abdominal distention, abdominal pain, anal bleeding, blood in stool, constipation, diarrhea, nausea, rectal pain and vomiting.  Endocrine: Negative for cold intolerance, heat intolerance, polydipsia, polyphagia and polyuria.  Musculoskeletal: Positive for arthralgias and back pain.  Neurological: Negative for dizziness, tremors, seizures, syncope, facial asymmetry, speech difficulty, weakness, light-headedness, numbness and headaches.  Psychiatric/Behavioral: Positive for dysphoric mood. Negative for self-injury, sleep disturbance and suicidal ideas. The patient is nervous/anxious.     Past Medical History:  Diagnosis Date  . Acute myocardial infarction of other anterior wall, episode of care unspecified   . Allergy   . Arthritis   . Coronary atherosclerosis of native coronary artery   . Depression   . DM (diabetes mellitus) (HCC)   . Dysmetabolic syndrome X   . HLD (hyperlipidemia)   . HTN (hypertension)   . Hypothyroidism   . Neuromuscular disorder (HCC)   . Obesity, unspecified   . Tobacco use disorder    Past Surgical History:  Procedure Laterality Date  . ACHILLES TENDON  SURGERY Left   . CARDIAC CATHETERIZATION    . Cardiac stenting    . EYE SURGERY    . HAND SURGERY Right    ganglion cyst, bone graft  . KNEE ARTHROSCOPY Right 1976  . STRABISMUS SURGERY Left  1959  . TOE SURGERY Right    x 3  . TUBAL LIGATION     Allergies  Allergen Reactions  . Aspirin   . Latex   . Nsaids   . Penicillins   . Sulfonamide Derivatives    Current Outpatient Medications on File Prior to Visit  Medication Sig Dispense Refill  . albuterol (PROVENTIL HFA;VENTOLIN HFA) 108 (90 Base) MCG/ACT inhaler Inhale 2 puffs into the lungs every 4 (four) hours as needed for wheezing or shortness of breath (cough, shortness of breath or wheezing.). 1 Inhaler 0  . carvedilol (COREG) 12.5 MG tablet Take 1 tablet (12.5 mg total) by mouth 2 (two) times daily. 180 tablet 3  . clopidogrel (PLAVIX) 75 MG tablet Take 1 tablet (75 mg total) by mouth daily. 90 tablet 3  . enalapril (VASOTEC) 10 MG tablet Take 1 tablet (10 mg total) by mouth 2 (two) times daily. 180 tablet 3  . hydrochlorothiazide (HYDRODIURIL) 25 MG tablet Take 1 tablet (25 mg total) by mouth daily. 90 tablet 3  . metFORMIN (GLUCOPHAGE) 1000 MG tablet Take 1 tablet (1,000 mg total) by mouth 2 (two) times daily with a meal. 180 tablet 3  . nitroGLYCERIN (NITROSTAT) 0.4 MG SL tablet Place 1 tablet (0.4 mg total) under the tongue every 5 (five) minutes as needed. 10 tablet 0  . simvastatin (ZOCOR) 40 MG tablet Take 1 tablet (40 mg total) by mouth at bedtime. 90 tablet 3   No current facility-administered medications on file prior to visit.    Social History   Socioeconomic History  . Marital status: Divorced    Spouse name: Homero Fellers  . Number of children: 2  . Years of education: Not on file  . Highest education level: Not on file  Occupational History  . 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 Financial resource strain: Not on file  . Food insecurity:    Worry: Not on file    Inability: Not on file  . Transportation needs:    Medical: Not on file    Non-medical: Not on file  Tobacco Use  . Smoking status: Current Every Day Smoker     Packs/day: 0.50    Years: 30.00    Pack years: 15.00    Types: Cigarettes  . Smokeless tobacco: Current User  Substance and Sexual Activity  . Alcohol use: Yes    Alcohol/week: 1.2 oz    Types: 2 Cans of beer per week    Comment: twice a week.  . Drug use: No  . Sexual activity: Yes    Partners: Male    Birth control/protection: None  Lifestyle  . Physical activity:    Days per week: Not on file    Minutes per session: Not on file  . Stress: Not on file  Relationships  . Social connections:    Talks on phone: Not on file    Gets together: Not on file    Attends religious service: Not on file    Active member of club or organization: Not on file    Attends meetings of clubs or organizations: Not on file  Relationship status: Not on file  . Intimate partner violence:    Fear of current or ex partner: Not on file    Emotionally abused: Not on file    Physically abused: Not on file    Forced sexual activity: Not on file  Other Topics Concern  . Not on file  Social History Narrative   Marital status: Divorced after 30 years of marriage in 2007.  Dating same gentleman/Frank x 9 years.   Children: 2 children, no grandchildren.  Oldest son with Down's syndrome.     Living: with boyfriend/Frank.   Employment:  Retired since 10/2016.    Tobacco: 1/2 ppd x 18 years.   Alcohol: 2 drinks on Thursday night/socially.   Drugs: none   Exercise: minimal walking in 2018 due to knee pain/OA.   Seatbelt: 100%; no texting      Family History  Problem Relation Age of Onset  . Dementia Mother   . Hypertension Mother   . Diabetes Mother   . Depression Father   . Heart disease Father 10       AMI age 31; CABG.  . Prostate cancer Father   . Hypertension Father   . Cancer Father        prostate cancer  . Hypertension Brother   . Arthritis Brother   . Heart disease Brother 35       AMI  . Diabetes Sister   . Heart disease Sister 40       mild AMI  . Hypertension Sister   .  Multiple sclerosis Sister   . Down syndrome Son   . Colon cancer Maternal Aunt 45       Objective:    BP 116/68   Pulse 85   Temp 98 F (36.7 C) (Oral)   Resp 16   Ht 5' 7.32" (1.71 m)   Wt 207 lb (93.9 kg)   SpO2 98%   BMI 32.11 kg/m  Physical Exam  Constitutional: She is oriented to person, place, and time. She appears well-developed and well-nourished. No distress.  HENT:  Head: Normocephalic and atraumatic.  Right Ear: External ear normal.  Left Ear: External ear normal.  Nose: Nose normal.  Mouth/Throat: Oropharynx is clear and moist.  Eyes: Pupils are equal, round, and reactive to light. Conjunctivae and EOM are normal.  Neck: Normal range of motion. Neck supple. Carotid bruit is not present. No thyromegaly present.  Cardiovascular: Normal rate, regular rhythm, normal heart sounds and intact distal pulses. Exam reveals no gallop and no friction rub.  No murmur heard. Pulmonary/Chest: Effort normal and breath sounds normal. She has no wheezes. She has no rales.  Abdominal: Soft. Bowel sounds are normal. She exhibits no distension and no mass. There is no tenderness. There is no rebound and no guarding.  Lymphadenopathy:    She has no cervical adenopathy.  Neurological: She is alert and oriented to person, place, and time. No cranial nerve deficit.  Skin: Skin is warm and dry. No rash noted. She is not diaphoretic. No erythema. No pallor.  Psychiatric: She has a normal mood and affect. Her behavior is normal.   No results found. Depression screen [facility] 2/9 01/19/2018 01/19/2018 11/15/2017 09/15/2017 05/26/2017  Decreased Interest 0 0 0 0 0  Down, Depressed, Hopeless 0 0 0 0 0  PHQ - 2 Score 0 0 0 0 0  Altered sleeping - - - - -  Tired, decreased energy - - - - -  Change in appetite - - - - -  Feeling bad or failure about yourself  - - - - -  Trouble concentrating - - - - -  Moving slowly or fidgety/restless - - - - -  Suicidal thoughts - - - - -  PHQ-9 Score - - - - -     Fall Risk  01/19/2018 01/19/2018 11/15/2017 09/15/2017 05/26/2017  Falls in the past year? No No No No No  Comment - - - - -  Number falls in past yr: - - - - -  Injury with Fall? - - - - -  Comment - - - - -        Assessment & Plan:   1. Type 2 diabetes mellitus with other circulatory complication, without long-term current use of insulin (HCC)   2. Pure hypercholesterolemia   3. Hypothyroidism due to acquired atrophy of thyroid   4. Essential hypertension, benign   5. Atherosclerosis of native coronary artery of native heart without angina pectoris   6. Reactive depression   7. Fibromyalgia   8. METABOLIC SYNDROME X     Diabetes mellitus type 2: Moderately controlled with hemoglobin A1c of 7.2.  No changes to management at this time.  Refill of metformin and glipizide provided.  Highly recommend weight loss, exercise, compliance with diabetic diet.  Hypercholesterolemia, hypothyroidism, hypertension well controlled.  Obtain labs for chronic disease management.  No changes to therapy at this time.  Coronary artery disease: Asymptomatic.  Encourage yearly follow-up with cardiology.  Continue current medications.  Reactive depression: Spouse currently suffering with significant anxiety which is affecting relationship.  Patient coping relatively well.  Continue Cymbalta therapy.  Fibromyalgia: Moderately controlled with Cymbalta, gabapentin, Flexeril therapies.  Agreeable to refilling hydrocodone for sparing use.  Congratulations on smoking cessation!  I am so very proud of you!  Orders Placed This Encounter  Procedures  . Comprehensive metabolic panel  . TSH  . Lipid panel  . T4, free  . POCT glycosylated hemoglobin (Hb A1C)   Meds ordered this encounter  Medications  . cyclobenzaprine (FLEXERIL) 5 MG tablet    Sig: Take 1 tablet (5 mg total) by mouth 3 (three) times daily as needed.    Dispense:  90 tablet    Refill:  5  . DULoxetine (CYMBALTA) 60 MG capsule    Sig:  Take 2 capsules (120 mg total) by mouth daily.    Dispense:  180 capsule    Refill:  1  . gabapentin (NEURONTIN) 300 MG capsule    Sig: Take 1-2 capsules (300-600 mg total) by mouth 4 (four) times daily.    Dispense:  450 capsule    Refill:  1  . glipiZIDE (GLUCOTROL XL) 10 MG 24 hr tablet    Sig: Take 1 tablet (10 mg total) by mouth daily with breakfast.    Dispense:  90 tablet    Refill:  1  . levothyroxine (SYNTHROID, LEVOTHROID) 75 MCG tablet    Sig: Take 1 tablet (75 mcg total) by mouth daily.    Dispense:  90 tablet    Refill:  3  . HYDROcodone-acetaminophen (NORCO/VICODIN) 5-325 MG tablet    Sig: Take 1 tablet by mouth 2 (two) times daily as needed for moderate pain.    Dispense:  30 tablet    Refill:  0    Return in about 4 months (around 05/21/2018) for complete physical examiniation [facility].   Kristi Paulita FujitaMartin Smith, M.D. Primary Care at [facility]  [place] [facility] [address]  [place], [place]  [address] [phone] phone [phone] fax

## 2018-01-19 NOTE — Patient Instructions (Addendum)
We recommend that you schedule a mammogram for breast cancer screening. Typically, you do not need a referral to do this. Please contact a local imaging center to schedule your mammogram.  [facility] - [phone]  *ask for the Radiology Department [facility]) - [phone] or [phone]  [facility] - [phone] [facility] - [phone] [facility] - [phone]  *ask for the Radiology Department [facility] - [phone]  *ask for the Radiology Department [facility] - [phone]  *ask for the Mammography Department [facility] - [phone]    IF you received an x-ray today, you will receive an invoice from [facility]. Please contact  Radiology at [phone] with questions or concerns regarding your invoice.   IF you received labwork today, you will receive an invoice from [facility]. Please contact [facility] at [phone] with questions or concerns regarding your invoice.   Our billing staff will not be able to assist you with questions regarding bills from these companies.  You will be contacted with the lab results as soon as they are available. The fastest way to get your results is to activate your My Chart account. Instructions are located on the last page of this paperwork. If you have not heard from us regarding the results in 2 weeks, please contact this office.     Diabetes Mellitus and Nutrition When you have diabetes (diabetes mellitus), it is very important to have healthy eating habits because your blood sugar (glucose) levels are greatly affected by what you eat and drink. Eating healthy foods in the appropriate amounts, at about the same times every day, can help you:  Control your blood glucose.  Lower your risk of heart disease.  Improve your blood pressure.  Reach or maintain a healthy weight.  Every person with  diabetes is different, and each person has different needs for a meal plan. Your health care provider may recommend that you work with a diet and nutrition specialist (dietitian) to make a meal plan that is best for you. Your meal plan may vary depending on factors such as:  The calories you need.  The medicines you take.  Your weight.  Your blood glucose, blood pressure, and cholesterol levels.  Your activity level.  Other health conditions you have, such as heart or kidney disease.  How do carbohydrates affect me? Carbohydrates affect your blood glucose level more than any other type of food. Eating carbohydrates naturally increases the amount of glucose in your blood. Carbohydrate counting is a method for keeping track of how many carbohydrates you eat. Counting carbohydrates is important to keep your blood glucose at a healthy level, especially if you use insulin or take certain oral diabetes medicines. It is important to know how many carbohydrates you can safely have in each meal. This is different for every person. Your dietitian can help you calculate how many carbohydrates you should have at each meal and for snack. Foods that contain carbohydrates include:  Bread, cereal, rice, pasta, and crackers.  Potatoes and corn.  Peas, beans, and lentils.  Milk and yogurt.  Fruit and juice.  Desserts, such as cakes, cookies, ice cream, and candy.  How does alcohol affect me? Alcohol can cause a sudden decrease in blood glucose (hypoglycemia), especially if you use insulin or take certain oral diabetes medicines. Hypoglycemia can be a life-threatening condition. Symptoms of hypoglycemia (sleepiness, dizziness, and confusion) are similar to symptoms   of having too much alcohol. If your health care provider says that alcohol is safe for you, follow these guidelines:  Limit alcohol intake to no more than 1 drink per day for nonpregnant women and 2 drinks per day for men. One drink equals  12 oz of beer, 5 oz of wine, or 1 oz of hard liquor.  Do not drink on an empty stomach.  Keep yourself hydrated with water, diet soda, or unsweetened iced tea.  Keep in mind that regular soda, juice, and other mixers may contain a lot of sugar and must be counted as carbohydrates.  What are tips for following this plan? Reading food labels  Start by checking the serving size on the label. The amount of calories, carbohydrates, fats, and other nutrients listed on the label are based on one serving of the food. Many foods contain more than one serving per package.  Check the total grams (g) of carbohydrates in one serving. You can calculate the number of servings of carbohydrates in one serving by dividing the total carbohydrates by 15. For example, if a food has 30 g of total carbohydrates, it would be equal to 2 servings of carbohydrates.  Check the number of grams (g) of saturated and trans fats in one serving. Choose foods that have low or no amount of these fats.  Check the number of milligrams (mg) of sodium in one serving. Most people should limit total sodium intake to less than 2,300 mg per day.  Always check the nutrition information of foods labeled as "low-fat" or "nonfat". These foods may be higher in added sugar or refined carbohydrates and should be avoided.  Talk to your dietitian to identify your daily goals for nutrients listed on the label. Shopping  Avoid buying canned, premade, or processed foods. These foods tend to be high in fat, sodium, and added sugar.  Shop around the outside edge of the grocery store. This includes fresh fruits and vegetables, bulk grains, fresh meats, and fresh dairy. Cooking  Use low-heat cooking methods, such as baking, instead of high-heat cooking methods like deep frying.  Cook using healthy oils, such as olive, canola, or sunflower oil.  Avoid cooking with butter, cream, or high-fat meats. Meal planning  Eat meals and snacks  regularly, preferably at the same times every day. Avoid going long periods of time without eating.  Eat foods high in fiber, such as fresh fruits, vegetables, beans, and whole grains. Talk to your dietitian about how many servings of carbohydrates you can eat at each meal.  Eat 4-6 ounces of lean protein each day, such as lean meat, chicken, fish, eggs, or tofu. 1 ounce is equal to 1 ounce of meat, chicken, or fish, 1 egg, or 1/4 cup of tofu.  Eat some foods each day that contain healthy fats, such as avocado, nuts, seeds, and fish. Lifestyle   Check your blood glucose regularly.  Exercise at least 30 minutes 5 or more days each week, or as told by your health care provider.  Take medicines as told by your health care provider.  Do not use any products that contain nicotine or tobacco, such as cigarettes and e-cigarettes. If you need help quitting, ask your health care provider.  Work with a counselor or diabetes educator to identify strategies to manage stress and any emotional and social challenges. What are some questions to ask my health care provider?  Do I need to meet with a diabetes educator?  Do I need to   meet with a dietitian?  What number can I call if I have questions?  When are the best times to check my blood glucose? Where to find more information:  American Diabetes Association: diabetes.org/food-and-fitness/food  Academy of Nutrition and Dietetics: www.eatright.org/resources/health/diseases-and-conditions/diabetes  National Institute of Diabetes and Digestive and Kidney Diseases (NIH): www.niddk.nih.gov/health-information/diabetes/overview/diet-eating-physical-activity Summary  A healthy meal plan will help you control your blood glucose and maintain a healthy lifestyle.  Working with a diet and nutrition specialist (dietitian) can help you make a meal plan that is best for you.  Keep in mind that carbohydrates and alcohol have immediate effects on your  blood glucose levels. It is important to count carbohydrates and to use alcohol carefully. This information is not intended to replace advice given to you by your health care provider. Make sure you discuss any questions you have with your health care provider. Document Released: 06/11/2005 Document Revised: 10/19/2016 Document Reviewed: 10/19/2016 Elsevier Interactive Patient Education  2018 Elsevier Inc.  

## 2018-01-20 LAB — LIPID PANEL
Chol/HDL Ratio: 3.5 ratio (ref 0.0–4.4)
Cholesterol, Total: 156 mg/dL (ref 100–199)
HDL: 45 mg/dL (ref >39)
LDL CALC: 61 mg/dL (ref 0–99)
TRIGLYCERIDES: 250 mg/dL — AB (ref 0–149)
VLDL Cholesterol Cal: 50 mg/dL — ABNORMAL HIGH (ref 5–40)

## 2018-01-20 LAB — COMPREHENSIVE METABOLIC PANEL
A/G RATIO: 1.6 (ref 1.2–2.2)
ALBUMIN: 4.2 g/dL (ref 3.6–4.8)
ALT: 8 IU/L (ref 0–32)
AST: 15 IU/L (ref 0–40)
Alkaline Phosphatase: 104 IU/L (ref 39–117)
BUN / CREAT RATIO: 14 (ref 12–28)
BUN: 11 mg/dL (ref 8–27)
Bilirubin Total: 0.3 mg/dL (ref 0.0–1.2)
CALCIUM: 9.2 mg/dL (ref 8.7–10.3)
CO2: 25 mmol/L (ref 20–29)
Chloride: 96 mmol/L (ref 96–106)
Creatinine, Ser: 0.77 mg/dL (ref 0.57–1.00)
GFR, EST AFRICAN AMERICAN: 95 mL/min/{1.73_m2} (ref >59)
GFR, EST NON AFRICAN AMERICAN: 82 mL/min/{1.73_m2} (ref >59)
GLOBULIN, TOTAL: 2.6 g/dL (ref 1.5–4.5)
Glucose: 155 mg/dL — ABNORMAL HIGH (ref 65–99)
POTASSIUM: 4.5 mmol/L (ref 3.5–5.2)
SODIUM: 137 mmol/L (ref 134–144)
TOTAL PROTEIN: 6.8 g/dL (ref 6.0–8.5)

## 2018-01-20 LAB — T4, FREE: FREE T4: 1.13 ng/dL (ref 0.82–1.77)

## 2018-01-20 LAB — TSH: TSH: 2.21 u[IU]/mL (ref 0.450–4.500)

## 2018-01-21 ENCOUNTER — Telehealth: Payer: Self-pay | Admitting: Family Medicine

## 2018-01-21 NOTE — Telephone Encounter (Signed)
Prior authorization for Norco 5/325 initiated through CoverMyMeds - Key: [id number] - Rx #: [id number].   Prior authorization approved. Pharmacy notified. Patient notified via MyChart.

## 2018-02-22 ENCOUNTER — Encounter: Payer: Self-pay | Admitting: Family Medicine

## 2018-03-18 ENCOUNTER — Other Ambulatory Visit: Payer: Self-pay | Admitting: Family Medicine

## 2018-03-18 DIAGNOSIS — E034 Atrophy of thyroid (acquired): Secondary | ICD-10-CM

## 2018-04-17 ENCOUNTER — Other Ambulatory Visit: Payer: Self-pay | Admitting: Family Medicine

## 2018-04-17 DIAGNOSIS — E034 Atrophy of thyroid (acquired): Secondary | ICD-10-CM

## 2018-04-21 ENCOUNTER — Other Ambulatory Visit: Payer: Self-pay | Admitting: Family Medicine

## 2018-04-22 NOTE — Telephone Encounter (Signed)
duloxetine refill Last Refill:01/19/18 # 180 1 RF Last OV: 01/19/18 PCP: Dr Katrinka BlazingSmith Pharmacy:[facility] [address]  Next appointment: 05/24/18 Filled for 30 days worth

## 2018-05-24 ENCOUNTER — Encounter: Payer: BLUE CROSS/BLUE SHIELD | Admitting: Family Medicine

## 2018-06-22 ENCOUNTER — Other Ambulatory Visit: Payer: Self-pay | Admitting: Family Medicine

## 2018-06-22 NOTE — Telephone Encounter (Signed)
Cymbalta refill Last Refill:04/22/18# 60 Last OV: 01/19/18 PCP: former Nilda Simmer pt Pharmacy:[facility] [address]  gabapentin refill Last Refill:01/19/18 # 450 caps Last OV: 01/19/18  Pt canceled f/u that was in August

## 2018-06-22 NOTE — Telephone Encounter (Signed)
Copied from CRM [id number]. Topic: Quick Communication - See Telephone Encounter >> Jun 22, 2018  3:28 PM Trula Slade wrote: CRM for notification. See Telephone encounter for: 06/22/18. Patient would like the following prescriptions refilled and sent to her preferred pharmacy [facility] [address]:  1) DULoxetine (CYMBALTA) 60 MG capsule 2) gabapentin (NEURONTIN) 300 MG capsule

## 2020-01-02 ENCOUNTER — Other Ambulatory Visit: Payer: Self-pay

## 2020-01-02 ENCOUNTER — Emergency Department (HOSPITAL_COMMUNITY): Payer: Medicare HMO

## 2020-01-02 ENCOUNTER — Observation Stay (HOSPITAL_COMMUNITY)
Admission: EM | Admit: 2020-01-02 | Discharge: 2020-01-03 | Disposition: A | Discharge status: Home / Self Care | Payer: Medicare HMO | Attending: Internal Medicine | Admitting: Internal Medicine

## 2020-01-02 DIAGNOSIS — D649 Anemia, unspecified: Secondary | ICD-10-CM | POA: Diagnosis not present

## 2020-01-02 DIAGNOSIS — I252 Old myocardial infarction: Secondary | ICD-10-CM | POA: Diagnosis not present

## 2020-01-02 DIAGNOSIS — M1990 Unspecified osteoarthritis, unspecified site: Secondary | ICD-10-CM | POA: Insufficient documentation

## 2020-01-02 DIAGNOSIS — Z23 Encounter for immunization: Secondary | ICD-10-CM | POA: Insufficient documentation

## 2020-01-02 DIAGNOSIS — Z86718 Personal history of other venous thrombosis and embolism: Secondary | ICD-10-CM | POA: Insufficient documentation

## 2020-01-02 DIAGNOSIS — R778 Other specified abnormalities of plasma proteins: Secondary | ICD-10-CM | POA: Diagnosis present

## 2020-01-02 DIAGNOSIS — Z880 Allergy status to penicillin: Secondary | ICD-10-CM | POA: Insufficient documentation

## 2020-01-02 DIAGNOSIS — Z7902 Long term (current) use of antithrombotics/antiplatelets: Secondary | ICD-10-CM | POA: Insufficient documentation

## 2020-01-02 DIAGNOSIS — I25119 Atherosclerotic heart disease of native coronary artery with unspecified angina pectoris: Secondary | ICD-10-CM | POA: Diagnosis present

## 2020-01-02 DIAGNOSIS — I509 Heart failure, unspecified: Secondary | ICD-10-CM

## 2020-01-02 DIAGNOSIS — J439 Emphysema, unspecified: Secondary | ICD-10-CM | POA: Insufficient documentation

## 2020-01-02 DIAGNOSIS — I350 Nonrheumatic aortic (valve) stenosis: Secondary | ICD-10-CM | POA: Diagnosis not present

## 2020-01-02 DIAGNOSIS — Z91040 Latex allergy status: Secondary | ICD-10-CM | POA: Insufficient documentation

## 2020-01-02 DIAGNOSIS — Z810 Family history of intellectual disabilities: Secondary | ICD-10-CM | POA: Insufficient documentation

## 2020-01-02 DIAGNOSIS — I340 Nonrheumatic mitral (valve) insufficiency: Secondary | ICD-10-CM | POA: Insufficient documentation

## 2020-01-02 DIAGNOSIS — R0602 Shortness of breath: Secondary | ICD-10-CM

## 2020-01-02 DIAGNOSIS — Z794 Long term (current) use of insulin: Secondary | ICD-10-CM | POA: Diagnosis not present

## 2020-01-02 DIAGNOSIS — E669 Obesity, unspecified: Secondary | ICD-10-CM | POA: Diagnosis not present

## 2020-01-02 DIAGNOSIS — Z20822 Contact with and (suspected) exposure to covid-19: Secondary | ICD-10-CM | POA: Diagnosis not present

## 2020-01-02 DIAGNOSIS — Z800 Family history of malignant neoplasm of digestive organs: Secondary | ICD-10-CM | POA: Insufficient documentation

## 2020-01-02 DIAGNOSIS — Z820 Family history of epilepsy and other diseases of the nervous system: Secondary | ICD-10-CM | POA: Insufficient documentation

## 2020-01-02 DIAGNOSIS — I5023 Acute on chronic systolic (congestive) heart failure: Secondary | ICD-10-CM | POA: Diagnosis present

## 2020-01-02 DIAGNOSIS — E041 Nontoxic single thyroid nodule: Secondary | ICD-10-CM | POA: Insufficient documentation

## 2020-01-02 DIAGNOSIS — I700 Atherosclerosis of aorta: Secondary | ICD-10-CM | POA: Insufficient documentation

## 2020-01-02 DIAGNOSIS — Z8261 Family history of arthritis: Secondary | ICD-10-CM | POA: Insufficient documentation

## 2020-01-02 DIAGNOSIS — Z955 Presence of coronary angioplasty implant and graft: Secondary | ICD-10-CM | POA: Insufficient documentation

## 2020-01-02 DIAGNOSIS — E785 Hyperlipidemia, unspecified: Secondary | ICD-10-CM | POA: Diagnosis not present

## 2020-01-02 DIAGNOSIS — Z8489 Family history of other specified conditions: Secondary | ICD-10-CM | POA: Insufficient documentation

## 2020-01-02 DIAGNOSIS — Z886 Allergy status to analgesic agent status: Secondary | ICD-10-CM | POA: Insufficient documentation

## 2020-01-02 DIAGNOSIS — Z6830 Body mass index (BMI) 30.0-30.9, adult: Secondary | ICD-10-CM | POA: Insufficient documentation

## 2020-01-02 DIAGNOSIS — Z8249 Family history of ischemic heart disease and other diseases of the circulatory system: Secondary | ICD-10-CM | POA: Insufficient documentation

## 2020-01-02 DIAGNOSIS — E1159 Type 2 diabetes mellitus with other circulatory complications: Secondary | ICD-10-CM | POA: Diagnosis not present

## 2020-01-02 DIAGNOSIS — R7989 Other specified abnormal findings of blood chemistry: Secondary | ICD-10-CM

## 2020-01-02 DIAGNOSIS — Z7901 Long term (current) use of anticoagulants: Secondary | ICD-10-CM | POA: Insufficient documentation

## 2020-01-02 DIAGNOSIS — I2510 Atherosclerotic heart disease of native coronary artery without angina pectoris: Secondary | ICD-10-CM | POA: Diagnosis not present

## 2020-01-02 DIAGNOSIS — Z79899 Other long term (current) drug therapy: Secondary | ICD-10-CM | POA: Diagnosis not present

## 2020-01-02 DIAGNOSIS — I313 Pericardial effusion (noninflammatory): Secondary | ICD-10-CM | POA: Diagnosis not present

## 2020-01-02 DIAGNOSIS — I10 Essential (primary) hypertension: Secondary | ICD-10-CM | POA: Diagnosis present

## 2020-01-02 DIAGNOSIS — Z833 Family history of diabetes mellitus: Secondary | ICD-10-CM | POA: Insufficient documentation

## 2020-01-02 DIAGNOSIS — I110 Hypertensive heart disease with heart failure: Secondary | ICD-10-CM | POA: Diagnosis not present

## 2020-01-02 DIAGNOSIS — I5022 Chronic systolic (congestive) heart failure: Secondary | ICD-10-CM

## 2020-01-02 DIAGNOSIS — E039 Hypothyroidism, unspecified: Secondary | ICD-10-CM | POA: Diagnosis not present

## 2020-01-02 DIAGNOSIS — Z8042 Family history of malignant neoplasm of prostate: Secondary | ICD-10-CM | POA: Insufficient documentation

## 2020-01-02 DIAGNOSIS — F17210 Nicotine dependence, cigarettes, uncomplicated: Secondary | ICD-10-CM | POA: Insufficient documentation

## 2020-01-02 DIAGNOSIS — F17200 Nicotine dependence, unspecified, uncomplicated: Secondary | ICD-10-CM | POA: Diagnosis present

## 2020-01-02 DIAGNOSIS — Z888 Allergy status to other drugs, medicaments and biological substances status: Secondary | ICD-10-CM | POA: Insufficient documentation

## 2020-01-02 LAB — CBC WITH DIFFERENTIAL/PLATELET
Abs Immature Granulocytes: 0.07 10*3/uL (ref 0.00–0.07)
Basophils Absolute: 0.1 10*3/uL (ref 0.0–0.1)
Basophils Relative: 1 %
Eosinophils Absolute: 0.1 10*3/uL (ref 0.0–0.5)
Eosinophils Relative: 1 %
HCT: 37.6 % (ref 36.0–46.0)
Hemoglobin: 11.3 g/dL — ABNORMAL LOW (ref 12.0–15.0)
Immature Granulocytes: 1 %
Lymphocytes Relative: 18 %
Lymphs Abs: 2.1 10*3/uL (ref 0.7–4.0)
MCH: 26.8 pg (ref 26.0–34.0)
MCHC: 30.1 g/dL (ref 30.0–36.0)
MCV: 89.3 fL (ref 80.0–100.0)
Monocytes Absolute: 0.5 10*3/uL (ref 0.1–1.0)
Monocytes Relative: 4 %
Neutro Abs: 9.1 10*3/uL — ABNORMAL HIGH (ref 1.7–7.7)
Neutrophils Relative %: 75 %
Platelets: 364 10*3/uL (ref 150–400)
RBC: 4.21 MIL/uL (ref 3.87–5.11)
RDW: 14.8 % (ref 11.5–15.5)
WBC: 12 10*3/uL — ABNORMAL HIGH (ref 4.0–10.5)
nRBC: 0 % (ref 0.0–0.2)

## 2020-01-02 LAB — COMPREHENSIVE METABOLIC PANEL
ALT: 11 U/L (ref 0–44)
AST: 19 U/L (ref 15–41)
Albumin: 3.4 g/dL — ABNORMAL LOW (ref 3.5–5.0)
Alkaline Phosphatase: 100 U/L (ref 38–126)
Anion gap: 11 (ref 5–15)
BUN: 10 mg/dL (ref 8–23)
CO2: 23 mmol/L (ref 22–32)
Calcium: 8.6 mg/dL — ABNORMAL LOW (ref 8.9–10.3)
Chloride: 101 mmol/L (ref 98–111)
Creatinine, Ser: 0.96 mg/dL (ref 0.44–1.00)
GFR calc Af Amer: >60 mL/min (ref >60)
GFR calc non Af Amer: >60 mL/min (ref >60)
Glucose, Bld: 174 mg/dL — ABNORMAL HIGH (ref 70–99)
Potassium: 4.6 mmol/L (ref 3.5–5.1)
Sodium: 135 mmol/L (ref 135–145)
Total Bilirubin: 0.6 mg/dL (ref 0.3–1.2)
Total Protein: 7.3 g/dL (ref 6.5–8.1)

## 2020-01-02 LAB — TROPONIN I (HIGH SENSITIVITY)
Troponin I (High Sensitivity): 21 ng/L — ABNORMAL HIGH (ref <18)
Troponin I (High Sensitivity): 79 ng/L — ABNORMAL HIGH (ref <18)
Troponin I (High Sensitivity): 79 ng/L — ABNORMAL HIGH (ref <18)

## 2020-01-02 LAB — BRAIN NATRIURETIC PEPTIDE: B Natriuretic Peptide: 1165 pg/mL — ABNORMAL HIGH (ref 0.0–100.0)

## 2020-01-02 LAB — PROTIME-INR
INR: 1 (ref 0.8–1.2)
Prothrombin Time: 13.2 seconds (ref 11.4–15.2)

## 2020-01-02 LAB — POC SARS CORONAVIRUS 2 AG -  ED: SARS Coronavirus 2 Ag: NEGATIVE

## 2020-01-02 MED ORDER — FUROSEMIDE 10 MG/ML IJ SOLN
20.0000 mg | Freq: Once | INTRAMUSCULAR | Status: AC
  Administered 2020-01-02: 20 mg via INTRAVENOUS
  Filled 2020-01-02: qty 2

## 2020-01-02 MED ORDER — ALBUTEROL SULFATE HFA 108 (90 BASE) MCG/ACT IN AERS
1.0000 | INHALATION_SPRAY | Freq: Once | RESPIRATORY_TRACT | Status: AC
  Administered 2020-01-02: 1 via RESPIRATORY_TRACT
  Filled 2020-01-02: qty 6.7

## 2020-01-02 MED ORDER — ACETAMINOPHEN 325 MG PO TABS
650.0000 mg | ORAL_TABLET | Freq: Once | ORAL | Status: AC
  Administered 2020-01-02: 650 mg via ORAL
  Filled 2020-01-02: qty 2

## 2020-01-02 MED ORDER — ROPINIROLE HCL 1 MG PO TABS
3.0000 mg | ORAL_TABLET | Freq: Every day | ORAL | Status: DC
  Administered 2020-01-02: 3 mg via ORAL
  Filled 2020-01-02 (×2): qty 3

## 2020-01-02 MED ORDER — IOHEXOL 350 MG/ML SOLN
100.0000 mL | Freq: Once | INTRAVENOUS | Status: AC | PRN
  Administered 2020-01-02: 100 mL via INTRAVENOUS

## 2020-01-02 MED ORDER — SODIUM CHLORIDE 0.9 % IV BOLUS
500.0000 mL | Freq: Once | INTRAVENOUS | Status: AC
  Administered 2020-01-02: 500 mL via INTRAVENOUS

## 2020-01-02 NOTE — ED Notes (Signed)
EDP at bedside  

## 2020-01-02 NOTE — ED Notes (Signed)
Pt transported to CT Scan

## 2020-01-02 NOTE — ED Provider Notes (Signed)
[facility] EMERGENCY DEPARTMENT Provider Note   CSN: [id number] Arrival date & time: 01/02/20  1113    History Chief Complaint  Patient presents with  . Shortness of Breath    Anna Noble is a 64 y.o. female with past medical history significant for MI, followed by [facility] cardiology, diabetes, metabolic syndrome, hypertension, tobacco use who presents for evaluation of shortness of breath.  Patient states she has been short of breath since she awoke this morning. She had her second dose of the [employer] vaccine yesterday.  States symptoms started suddenly.  She denies associated chest pain.  Symptoms are nonpleuritic in nature however she feels like she has dyspnea when she exerts herself.  She admits to a mild nonproductive cough.  Denies fever, chills, nausea, vomiting, hemoptysis, abdominal pain, diarrhea, dysuria.  No recent Covid exposures.  Denies additional aggravating or alleviating factors.  Patient states she does have prior history of blood clots and is on anticoagulation however does not know what medication this is.  States she has not missed any doses of her medication.  No recent travel, malignancy or exogenous hormone use.  Denies lower extremity edema redness or warmth  History obtained from patient and past medical records.  No interpreter is used. HPI     Past Medical History:  Diagnosis Date  . Acute myocardial infarction of other anterior wall, episode of care unspecified   . Allergy   . Arthritis   . Coronary atherosclerosis of native coronary artery   . Depression   . DM (diabetes mellitus) ([place])   . Dysmetabolic syndrome X   . HLD (hyperlipidemia)   . HTN (hypertension)   . Hypothyroidism   . Neuromuscular disorder ([place])   . Obesity, unspecified   . Tobacco use disorder     Patient Active Problem List   Diagnosis Date Noted  . Acute CHF (congestive heart failure) ([place]) 01/02/2020  . Pure hypercholesterolemia 11/03/2016  . Thyroid  activity decreased 03/20/2014  . Fibromyalgia 03/20/2014  . Depression 03/20/2014  . TOBACCO USER 02/21/2010  . Diabetes ([place]) 02/19/2010  . METABOLIC SYNDROME X 35/32/9924  . Essential hypertension, benign 02/19/2010  . MYOCARDIAL INFARCTION, ANTERIOR WALL 02/19/2010  . CAD, NATIVE VESSEL 02/19/2010    Past Surgical History:  Procedure Laterality Date  . ACHILLES TENDON SURGERY Left   . CARDIAC CATHETERIZATION    . Cardiac stenting    . EYE SURGERY    . HAND SURGERY Right    ganglion cyst, bone graft  . KNEE ARTHROSCOPY Right 1976  . STRABISMUS SURGERY Left 1959  . TOE SURGERY Right    x 3  . TUBAL LIGATION       OB History   No obstetric history on file.     Family History  Problem Relation Age of Onset  . Dementia Mother   . Hypertension Mother   . Diabetes Mother   . Depression Father   . Heart disease Father 58       AMI age 62; CABG.  . Prostate cancer Father   . Hypertension Father   . Cancer Father        prostate cancer  . Hypertension Brother   . Arthritis Brother   . Heart disease Brother 47       AMI  . Diabetes Sister   . Heart disease Sister 50       mild AMI  . Hypertension Sister   . Multiple sclerosis Sister   .  Down syndrome Son   . Colon cancer Maternal Aunt 15    Social History   Tobacco Use  . Smoking status: Current Every Day Smoker    Packs/day: 0.50    Years: 30.00    Pack years: 15.00    Types: Cigarettes  . Smokeless tobacco: Current User  Substance Use Topics  . Alcohol use: Yes    Alcohol/week: 2.0 standard drinks    Types: 2 Cans of beer per week    Comment: twice a week.  . Drug use: No    Home Medications Prior to Admission medications   Medication Sig Start Date End Date Taking? Authorizing Provider  albuterol (PROVENTIL HFA;VENTOLIN HFA) 108 (90 Base) MCG/ACT inhaler Inhale 2 puffs into the lungs every 4 (four) hours as needed for wheezing or shortness of breath (cough, shortness of breath or wheezing.).  11/15/17  Yes Barnett Abu, [place] D, PA-C  carvedilol (COREG) 12.5 MG tablet Take 1 tablet (12.5 mg total) by mouth 2 (two) times daily. 06/03/17  Yes Ethelda Chick, MD  clopidogrel (PLAVIX) 75 MG tablet Take 1 tablet (75 mg total) by mouth daily. 07/12/17  Yes Ethelda Chick, MD  cyclobenzaprine (FLEXERIL) 5 MG tablet Take 1 tablet (5 mg total) by mouth 3 (three) times daily as needed. 01/19/18  Yes Ethelda Chick, MD  Dulaglutide (TRULICITY) 0.75 MG/0.5ML SOPN Inject 0.75 mg into the skin every 7 (seven) days.    Yes [provider]  enalapril (VASOTEC) 10 MG tablet Take 1 tablet (10 mg total) by mouth 2 (two) times daily. 06/03/17  Yes Ethelda Chick, MD  glipiZIDE (GLUCOTROL XL) 10 MG 24 hr tablet Take 1 tablet (10 mg total) by mouth daily with breakfast. 01/19/18  Yes Ethelda Chick, MD  levothyroxine (SYNTHROID, LEVOTHROID) 75 MCG tablet Take 1 tablet (75 mcg total) by mouth daily. 01/19/18  Yes Ethelda Chick, MD  metFORMIN (GLUCOPHAGE) 1000 MG tablet Take 1 tablet (1,000 mg total) by mouth 2 (two) times daily with a meal. 06/03/17  Yes Ethelda Chick, MD  nitroGLYCERIN (NITROSTAT) 0.4 MG SL tablet Place 1 tablet (0.4 mg total) under the tongue every 5 (five) minutes as needed. Patient taking differently: Place 0.4 mg under the tongue every 5 (five) minutes as needed for chest pain.  05/24/17  Yes Ofilia Neas, PA-C  simvastatin (ZOCOR) 40 MG tablet Take 1 tablet (40 mg total) by mouth at bedtime. 06/03/17  Yes Ethelda Chick, MD  hydrochlorothiazide (HYDRODIURIL) 25 MG tablet Take 1 tablet (25 mg total) by mouth daily. Patient not taking: Reported on 01/02/2020 06/03/17   Ethelda Chick, MD    Allergies    Penicillins, Aspirin, Nsaids, and Latex  Review of Systems   Review of Systems  Constitutional: Negative.   HENT: Negative.   Respiratory: Positive for cough and shortness of breath. Negative for apnea, choking, chest tightness, wheezing and stridor.   Cardiovascular: Negative.    Gastrointestinal: Negative.   Genitourinary: Negative.   Musculoskeletal: Negative.   Skin: Negative.   Neurological: Negative.   All other systems reviewed and are negative.   Physical Exam Updated Vital Signs BP 134/62   Pulse 99   Temp 100.1 F (37.8 C) (Oral)   Resp 20   Ht 5\' 7"  (1.702 m)   Wt 89.4 kg   SpO2 99%   BMI 30.85 kg/m   Physical Exam Vitals and nursing note reviewed.  Constitutional:      General: She is  not in acute distress.    Appearance: She is well-developed. She is not ill-appearing.  HENT:     Head: Normocephalic and atraumatic.     Mouth/Throat:     Mouth: Mucous membranes are moist.  Eyes:     Pupils: Pupils are equal, round, and reactive to light.  Cardiovascular:     Rate and Rhythm: Tachycardia present.     Pulses: Normal pulses.     Heart sounds: Normal heart sounds.  Pulmonary:     Effort: No respiratory distress.     Comments: Mild expiratory wheeze to lower bases.  Speaks in full sentences without difficulty Abdominal:     General: Bowel sounds are normal. There is no distension.     Palpations: Abdomen is soft.     Comments: Soft, nontender without rebound or guarding  Musculoskeletal:        General: Normal range of motion.     Cervical back: Normal range of motion.     Right lower leg: No tenderness. No edema.     Left lower leg: No tenderness. No edema.     Comments: No edema, erythema or warmth.  Moves all 4 extremities without difficulty.  2+ DP, PT pulses bilaterally.  Tactile temperatures and lower extremities  Skin:    General: Skin is warm and dry.     Capillary Refill: Capillary refill takes less than 2 seconds.     Comments: No edema, erythema or warmth.  Brisk capillary refill  Neurological:     General: No focal deficit present.     Mental Status: She is alert.     ED Results / Procedures / Treatments   Labs (all labs ordered are listed, but only abnormal results are displayed) Labs Reviewed  CBC WITH  DIFFERENTIAL/PLATELET - Abnormal; Notable for the following components:      Result Value   WBC 12.0 (*)    Hemoglobin 11.3 (*)    Neutro Abs 9.1 (*)    All other components within normal limits  COMPREHENSIVE METABOLIC PANEL - Abnormal; Notable for the following components:   Glucose, Bld 174 (*)    Calcium 8.6 (*)    Albumin 3.4 (*)    All other components within normal limits  BRAIN NATRIURETIC PEPTIDE - Abnormal; Notable for the following components:   B Natriuretic Peptide 1,165.0 (*)    All other components within normal limits  TROPONIN I (HIGH SENSITIVITY) - Abnormal; Notable for the following components:   Troponin I (High Sensitivity) 21 (*)    All other components within normal limits  TROPONIN I (HIGH SENSITIVITY) - Abnormal; Notable for the following components:   Troponin I (High Sensitivity) 79 (*)    All other components within normal limits  TROPONIN I (HIGH SENSITIVITY) - Abnormal; Notable for the following components:   Troponin I (High Sensitivity) 79 (*)    All other components within normal limits  SARS CORONAVIRUS 2 (TAT 6-24 HRS)  PROTIME-INR  POC SARS CORONAVIRUS 2 AG -  ED  TROPONIN I (HIGH SENSITIVITY)    EKG EKG Interpretation  Date/Time:  Tuesday January 02 2020 11:21:36 EDT Ventricular Rate:  125 PR Interval:  152 QRS Duration: 66 QT Interval:  320 QTC Calculation: 461 R Axis:   88 Text Interpretation: Sinus tachycardia Anteroseptal infarct , age undetermined Abnormal ECG No STEMI , no significant changes from Sept 2001 ecg, same Q waves in precordial leads Confirmed by Alvester Chou [phone]) on 01/02/2020 6:59:12 PM   Radiology DG  Chest 2 View  Result Date: 01/02/2020 CLINICAL DATA:  Shortness of breath following COVID vaccine. EXAM: CHEST - 2 VIEW COMPARISON:  None. FINDINGS: Normal heart size. Small volume of pleural fluid is identified with thickening of the fissures. Mild diffuse interstitial edema. No airspace consolidation. Osseous  structures notable for multilevel thoracic degenerative disc disease. IMPRESSION: 1. Mild CHF. Electronically Signed   By: Signa Kell M.D.   On: 01/02/2020 11:46   CT Angio Chest PE W/Cm &/Or Wo Cm  Result Date: 01/02/2020 CLINICAL DATA:  Shortness of breath. EXAM: CT ANGIOGRAPHY CHEST WITH CONTRAST TECHNIQUE: Multidetector CT imaging of the chest was performed using the standard protocol during bolus administration of intravenous contrast. Multiplanar CT image reconstructions and MIPs were obtained to evaluate the vascular anatomy. CONTRAST:  70 mL OMNIPAQUE IOHEXOL 350 MG/ML SOLN COMPARISON:  Chest x-ray dated 01/02/2020 FINDINGS: Cardiovascular: No pulmonary emboli. Extensive calcification in the left coronary artery. Heart size is normal. No pericardial effusion. Slight aortic atherosclerosis. Mediastinum/Nodes: There multiple enlarged left axillary lymph nodes as well as mediastinal adenopathy and bilateral hilar adenopathy. The largest node in the left axilla is 15 mm. There is a 12 mm node adjacent to the left side of the aortic arch. 14 mm node at the inferior aspect of the left hilum. Small nodule in the right lobe of the thyroid gland, not requiring follow-up. Trachea and esophagus are normal. Lungs/Pleura: Small bilateral pleural effusions. Slight emphysematous disease in the upper lobes. Slight atelectasis at the lung bases. Upper Abdomen: No significant abnormality. Musculoskeletal: No chest wall abnormality. No acute or significant osseous findings. Review of the MIP images confirms the above findings. IMPRESSION: 1. No pulmonary emboli. 2. Small bilateral pleural effusions with slight bibasilar atelectasis. 3. Mediastinal and bilateral hilar adenopathy. Does the patient have a history of lymphoma? 4. Extensive calcification in the left coronary artery. 5. Slight emphysema in the upper lobes. Aortic Atherosclerosis (ICD10-I70.0) and Emphysema (ICD10-J43.9). Electronically Signed   By: Francene Boyers M.D.   On: 01/02/2020 19:09    Procedures .Critical Care Performed by: Linwood Dibbles, PA-C Authorized by: Linwood Dibbles, PA-C   Critical care provider statement:    Critical care time (minutes):  45   Critical care was necessary to treat or prevent imminent or life-threatening deterioration of the following conditions:  Cardiac failure (Elevated troponin)   Critical care was time spent personally by me on the following activities:  Discussions with consultants, evaluation of patient's response to treatment, examination of patient, ordering and performing treatments and interventions, ordering and review of laboratory studies, ordering and review of radiographic studies, pulse oximetry, re-evaluation of patient's condition, obtaining history from patient or surrogate and review of old charts   (including critical care time)  Medications Ordered in ED Medications  albuterol (VENTOLIN HFA) 108 (90 Base) MCG/ACT inhaler 1 puff (1 puff Inhalation Given 01/02/20 1743)  sodium chloride 0.9 % bolus 500 mL (0 mLs Intravenous Stopped 01/02/20 1928)  acetaminophen (TYLENOL) tablet 650 mg (650 mg Oral Given 01/02/20 1744)  iohexol (OMNIPAQUE) 350 MG/ML injection 100 mL (100 mLs Intravenous Contrast Given 01/02/20 1848)  furosemide (LASIX) injection 20 mg (20 mg Intravenous Given 01/02/20 2016)    ED Course  I have reviewed the triage vital signs and the nursing notes.  Pertinent labs & imaging results that were available during my care of the patient were reviewed by me and considered in my medical decision making (see chart for details).  64 year old female presents for  evaluation of shortness of breath.  Denies prior history of similar.  No associated chest pain, hemoptysis, lower extremity edema.  Diffuse expiratory wheeze on exam.  She is tachycardic with low-grade temperature of 100.1.  No recent Covid exposures.  She looks overall well appearing in no acute respiratory distress.  We  will plan on labs, imaging and reassess.  Labs and imaging personally reviewed and interpreted CBC with leukocytosis at 12.0 Metabolic panel with mild hyperglycemia at 174 INR 1.0 BNP 1165 X-ray with some CHF assignment fluid in her fissures. CTA negative for PE however she does have some large lymph nodes.  Denies prior history of lymphoma Troponin 21>>>79  Patient reassessed.  She still continues have some mild tachycardia and tachypnea.  She has had a low-grade temp however I feel this is likely reactionary from her recent Covid vaccine.  I have low suspicion for sepsis.  Denies any infectious complaints.  [facility]. Recommends third trop and if elevated from previous would like for Cardiology to be consulted if elevated from previous trop and not flat.  Third troponin 79, flat.  Will admit to hospitalist service with Dr. Toniann FailKakrakandy.  Cardiology was not consulted in EDdue to flat third troponin however would likely need consult with Cards in AM.  Patient seen evaluated by attending, Dr. Renaye Rakersrifan who agrees with the treatment, plan and disposition.  The patient appears reasonably stabilized for admission considering the current resources, flow, and capabilities available in the ED at this time, and I doubt any [facility] further screening and/or treatment in the ED prior to admission.    MDM Rules/Calculators/A&P                      Anna Noble was evaluated in Emergency Department on 01/02/2020 for the symptoms described in the history of present illness. She was evaluated in the context of the global COVID-19 pandemic, which necessitated consideration that the patient might be at risk for infection with the SARS-CoV-2 virus that causes COVID-19. Institutional protocols and algorithms that pertain to the evaluation of patients at risk for COVID-19 are in a state of rapid change based on information released by  regulatory bodies including the CDC and federal and state organizations. These policies and algorithms were followed during the patient's care in the ED. Final Clinical Impression(s) / ED Diagnoses Final diagnoses:  SOB (shortness of breath)  Elevated troponin    Rx / DC Orders ED Discharge Orders    None       Simon Llamas A, PA-C 01/02/20 2207    Terald Sleeperrifan, Matthew J, MD 01/03/20 1157

## 2020-01-02 NOTE — ED Notes (Signed)
EDP made aware of elevated Troponin; no further orders at this time.  

## 2020-01-02 NOTE — ED Notes (Signed)
RN ambulated pt, O2 sat decreased to 91%, pt endorsed feeling SOB, but denied dizziness, or being lightheaded.

## 2020-01-02 NOTE — ED Triage Notes (Signed)
Patient stated she received her covid vaccine #2 yesterday at noon and c/o shortness of breath;

## 2020-01-03 ENCOUNTER — Encounter (HOSPITAL_COMMUNITY): Payer: Self-pay | Admitting: Internal Medicine

## 2020-01-03 ENCOUNTER — Inpatient Hospital Stay (HOSPITAL_BASED_OUTPATIENT_CLINIC_OR_DEPARTMENT_OTHER): Payer: Medicare HMO

## 2020-01-03 DIAGNOSIS — I2510 Atherosclerotic heart disease of native coronary artery without angina pectoris: Secondary | ICD-10-CM

## 2020-01-03 DIAGNOSIS — I10 Essential (primary) hypertension: Secondary | ICD-10-CM | POA: Diagnosis not present

## 2020-01-03 DIAGNOSIS — I340 Nonrheumatic mitral (valve) insufficiency: Secondary | ICD-10-CM

## 2020-01-03 DIAGNOSIS — I110 Hypertensive heart disease with heart failure: Secondary | ICD-10-CM | POA: Diagnosis not present

## 2020-01-03 DIAGNOSIS — I5041 Acute combined systolic (congestive) and diastolic (congestive) heart failure: Secondary | ICD-10-CM

## 2020-01-03 DIAGNOSIS — E1159 Type 2 diabetes mellitus with other circulatory complications: Secondary | ICD-10-CM

## 2020-01-03 DIAGNOSIS — Z720 Tobacco use: Secondary | ICD-10-CM | POA: Diagnosis not present

## 2020-01-03 DIAGNOSIS — I5023 Acute on chronic systolic (congestive) heart failure: Secondary | ICD-10-CM | POA: Diagnosis present

## 2020-01-03 DIAGNOSIS — I509 Heart failure, unspecified: Secondary | ICD-10-CM

## 2020-01-03 LAB — CBC
HCT: 32.4 % — ABNORMAL LOW (ref 36.0–46.0)
Hemoglobin: 10 g/dL — ABNORMAL LOW (ref 12.0–15.0)
MCH: 26.7 pg (ref 26.0–34.0)
MCHC: 30.9 g/dL (ref 30.0–36.0)
MCV: 86.6 fL (ref 80.0–100.0)
Platelets: 280 10*3/uL (ref 150–400)
RBC: 3.74 MIL/uL — ABNORMAL LOW (ref 3.87–5.11)
RDW: 14.8 % (ref 11.5–15.5)
WBC: 8.8 10*3/uL (ref 4.0–10.5)
nRBC: 0 % (ref 0.0–0.2)

## 2020-01-03 LAB — COMPREHENSIVE METABOLIC PANEL
ALT: 8 U/L (ref 0–44)
AST: 17 U/L (ref 15–41)
Albumin: 3.3 g/dL — ABNORMAL LOW (ref 3.5–5.0)
Alkaline Phosphatase: 90 U/L (ref 38–126)
Anion gap: 12 (ref 5–15)
BUN: 12 mg/dL (ref 8–23)
CO2: 27 mmol/L (ref 22–32)
Calcium: 8.9 mg/dL (ref 8.9–10.3)
Chloride: 98 mmol/L (ref 98–111)
Creatinine, Ser: 0.92 mg/dL (ref 0.44–1.00)
GFR calc Af Amer: >60 mL/min (ref >60)
GFR calc non Af Amer: >60 mL/min (ref >60)
Glucose, Bld: 108 mg/dL — ABNORMAL HIGH (ref 70–99)
Potassium: 3.6 mmol/L (ref 3.5–5.1)
Sodium: 137 mmol/L (ref 135–145)
Total Bilirubin: 0.5 mg/dL (ref 0.3–1.2)
Total Protein: 6.8 g/dL (ref 6.5–8.1)

## 2020-01-03 LAB — CREATININE, SERUM
Creatinine, Ser: 0.91 mg/dL (ref 0.44–1.00)
GFR calc Af Amer: >60 mL/min (ref >60)
GFR calc non Af Amer: >60 mL/min (ref >60)

## 2020-01-03 LAB — MRSA PCR SCREENING: MRSA by PCR: NEGATIVE

## 2020-01-03 LAB — TSH
TSH: 1.884 u[IU]/mL (ref 0.350–4.500)
TSH: 0.944 u[IU]/mL (ref 0.350–4.500)

## 2020-01-03 LAB — GLUCOSE, CAPILLARY
Glucose-Capillary: 101 mg/dL — ABNORMAL HIGH (ref 70–99)
Glucose-Capillary: 116 mg/dL — ABNORMAL HIGH (ref 70–99)
Glucose-Capillary: 128 mg/dL — ABNORMAL HIGH (ref 70–99)

## 2020-01-03 LAB — ECHOCARDIOGRAM COMPLETE
Height: 67 in
Weight: 3142.88 oz

## 2020-01-03 LAB — MAGNESIUM
Magnesium: 1.7 mg/dL (ref 1.7–2.4)
Magnesium: 1.8 mg/dL (ref 1.7–2.4)

## 2020-01-03 LAB — SARS CORONAVIRUS 2 (TAT 6-24 HRS): SARS Coronavirus 2: NEGATIVE

## 2020-01-03 LAB — HIV ANTIBODY (ROUTINE TESTING W REFLEX): HIV Screen 4th Generation wRfx: NONREACTIVE

## 2020-01-03 MED ORDER — CLOPIDOGREL BISULFATE 75 MG PO TABS
75.0000 mg | ORAL_TABLET | Freq: Every day | ORAL | Status: DC
  Administered 2020-01-03: 75 mg via ORAL
  Filled 2020-01-03: qty 1

## 2020-01-03 MED ORDER — NICOTINE 14 MG/24HR TD PT24: 14.0000 mg | MEDICATED_PATCH | Freq: Every day | TRANSDERMAL | 0 refills | Status: AC

## 2020-01-03 MED ORDER — CARVEDILOL 12.5 MG PO TABS
12.5000 mg | ORAL_TABLET | Freq: Two times a day (BID) | ORAL | Status: DC
  Administered 2020-01-03: 12.5 mg via ORAL
  Filled 2020-01-03: qty 1

## 2020-01-03 MED ORDER — FUROSEMIDE 20 MG PO TABS: 40.0000 mg | ORAL_TABLET | Freq: Every day | ORAL | 0 refills | Status: DC

## 2020-01-03 MED ORDER — PERFLUTREN LIPID MICROSPHERE
1.0000 mL | INTRAVENOUS | Status: AC | PRN
  Administered 2020-01-03: 2 mL via INTRAVENOUS
  Filled 2020-01-03: qty 10

## 2020-01-03 MED ORDER — ONDANSETRON HCL 4 MG PO TABS: 4.0000 mg | ORAL_TABLET | Freq: Four times a day (QID) | ORAL | Status: DC | PRN

## 2020-01-03 MED ORDER — ENALAPRIL MALEATE 5 MG PO TABS
10.0000 mg | ORAL_TABLET | Freq: Two times a day (BID) | ORAL | Status: DC
  Administered 2020-01-03 (×2): 10 mg via ORAL
  Filled 2020-01-03 (×4): qty 2

## 2020-01-03 MED ORDER — FUROSEMIDE 10 MG/ML IJ SOLN
20.0000 mg | Freq: Two times a day (BID) | INTRAMUSCULAR | Status: DC
  Administered 2020-01-03: 20 mg via INTRAVENOUS
  Filled 2020-01-03: qty 2

## 2020-01-03 MED ORDER — ENOXAPARIN SODIUM 40 MG/0.4ML ~~LOC~~ SOLN: 40.0000 mg | SUBCUTANEOUS | Status: DC

## 2020-01-03 MED ORDER — SIMVASTATIN 20 MG PO TABS
40.0000 mg | ORAL_TABLET | Freq: Every day | ORAL | Status: DC
  Administered 2020-01-03: 40 mg via ORAL
  Filled 2020-01-03: qty 2

## 2020-01-03 MED ORDER — LEVOTHYROXINE SODIUM 75 MCG PO TABS
75.0000 ug | ORAL_TABLET | Freq: Every day | ORAL | Status: DC
  Administered 2020-01-03: 75 ug via ORAL
  Filled 2020-01-03: qty 1

## 2020-01-03 MED ORDER — ONDANSETRON HCL 4 MG/2ML IJ SOLN: 4.0000 mg | Freq: Four times a day (QID) | INTRAMUSCULAR | Status: DC | PRN

## 2020-01-03 MED ORDER — FUROSEMIDE 20 MG PO TABS
20.0000 mg | ORAL_TABLET | Freq: Every day | ORAL | Status: DC
  Administered 2020-01-03: 20 mg via ORAL
  Filled 2020-01-03: qty 1

## 2020-01-03 MED ORDER — INSULIN ASPART 100 UNIT/ML ~~LOC~~ SOLN: 0.0000 [IU] | Freq: Three times a day (TID) | SUBCUTANEOUS | Status: DC

## 2020-01-03 MED ORDER — ACETAMINOPHEN 325 MG PO TABS: 650.0000 mg | ORAL_TABLET | Freq: Four times a day (QID) | ORAL | Status: DC | PRN

## 2020-01-03 MED ORDER — ACETAMINOPHEN 650 MG RE SUPP: 650.0000 mg | Freq: Four times a day (QID) | RECTAL | Status: DC | PRN

## 2020-01-03 MED ORDER — NITROGLYCERIN 0.4 MG SL SUBL: 0.4000 mg | SUBLINGUAL_TABLET | SUBLINGUAL | Status: DC | PRN

## 2020-01-03 NOTE — Care Management CC44 (Signed)
Condition Code 44 Documentation Completed  Patient Details  Name: Anna Noble MRN: [id number] Date of Birth: January 05, 1956   Condition Code 44 given:  Yes Patient signature on Condition Code 44 notice:  Yes Documentation of 2 MD's agreement:  Yes Code 44 added to claim:  Yes    Leone Haven, RN 01/03/2020, 4:19 PM

## 2020-01-03 NOTE — Consult Note (Addendum)
Cardiology Consultation:   Patient ID: Anna Noble MRN: [id number]; DOB: [birth year]  Admit date: 01/02/2020 Date of Consult: 01/03/2020  Primary Care Provider: Montez Hageman, DO Primary Cardiologist: No primary care provider on file. Hochrein 2015 Primary Electrophysiologist:  None    Patient Profile:   Anna Noble is a 64 y.o. female with a hx of HTN, HL, DM, Depression, CAD s/p PCI of the LAD (2001), hypothyroidism, tobacco use who is being seen today for the evaluation of CHF at the request of Dr. Allena Katz.  History of Present Illness:   Anna Noble is a 64 yo female with PMH noted above.  She was previously followed by Dr. Daleen Squibb as an outpatient.  She has a history of coronary artery disease with a catheterization in 2001 showing LAD stenosis of 90%, second diagonal with 99% stenosis and ramus with 30% lesion.  Underwent stenting of the LAD which required a balloon pump.  Had a stress test in 2017 with EF noted at 50%.  She transferred her care to Dr. Antoine Poche when Dr. Daleen Squibb retired.  Her last visit was in 2015.  Notes indicate she had previously reported anginal symptoms but did not go for stress testing.  At this follow-up appointment she was not interested in doing a treadmill test and was continued on risk reduction.  She was still smoking at that time.  She was lost to follow-up as she states she did not wish to see Dr. Antoine Poche back in follow-up.  Reports getting her initial Covid shot about 3 weeks prior.  Has had intermittent episodes of shortness of breath, but these have been brief in nature.  No significant lower extremity edema reported.  States yesterday she received her second Covid vaccine and several hours later became significantly short of breath.  Eventually became concerned to the point where she called EMS for transport to the hospital.  Denies any chest pain, nausea, vomiting, diaphoresis with associated shortness of breath.  In the ED her labs showed stable electrolytes,  creatinine of 0.96, high-sensitivity troponin 21>>79, WBC 12, BNP 1165.  CT chest showed small bilateral pleural effusions with atelectasis along with mediastinal and bilateral hilar adenopathy.  Extensive calcification noted in the left coronary artery.  EKG showed Sinus tachycardia 125 bpm.  She was given IV Lasix in the ED and admitted to internal medicine for further work-up.  Echocardiogram showed EF of 40 to 45% with moderate LVH hypokinesis of the left ventricle, mid apical inferior, and apical segment and anterior wall.   Past Medical History:  Diagnosis Date  . Acute myocardial infarction of other anterior wall, episode of care unspecified   . Allergy   . Arthritis   . Coronary atherosclerosis of native coronary artery   . Depression   . DM (diabetes mellitus) (HCC)   . Dysmetabolic syndrome X   . HLD (hyperlipidemia)   . HTN (hypertension)   . Hypothyroidism   . Neuromuscular disorder (HCC)   . Obesity, unspecified   . Tobacco use disorder     Past Surgical History:  Procedure Laterality Date  . ACHILLES TENDON SURGERY Left   . CARDIAC CATHETERIZATION    . Cardiac stenting    . EYE SURGERY    . HAND SURGERY Right    ganglion cyst, bone graft  . KNEE ARTHROSCOPY Right 1976  . STRABISMUS SURGERY Left 1959  . TOE SURGERY Right    x 3  . TUBAL LIGATION       Home Medications:  Prior to Admission medications   Medication Sig Start Date End Date Taking? Authorizing Provider  albuterol (PROVENTIL HFA;VENTOLIN HFA) 108 (90 Base) MCG/ACT inhaler Inhale 2 puffs into the lungs every 4 (four) hours as needed for wheezing or shortness of breath (cough, shortness of breath or wheezing.). 11/15/17  Yes Barnett Abu, [place] D, PA-C  carvedilol (COREG) 12.5 MG tablet Take 1 tablet (12.5 mg total) by mouth 2 (two) times daily. 06/03/17  Yes Ethelda Chick, MD  clopidogrel (PLAVIX) 75 MG tablet Take 1 tablet (75 mg total) by mouth daily. 07/12/17  Yes Ethelda Chick, MD  cyclobenzaprine  (FLEXERIL) 5 MG tablet Take 1 tablet (5 mg total) by mouth 3 (three) times daily as needed. 01/19/18  Yes Ethelda Chick, MD  Dulaglutide (TRULICITY) 0.75 MG/0.5ML SOPN Inject 0.75 mg into the skin every 7 (seven) days.    Yes [provider]  enalapril (VASOTEC) 10 MG tablet Take 1 tablet (10 mg total) by mouth 2 (two) times daily. 06/03/17  Yes Ethelda Chick, MD  glipiZIDE (GLUCOTROL XL) 10 MG 24 hr tablet Take 1 tablet (10 mg total) by mouth daily with breakfast. 01/19/18  Yes Ethelda Chick, MD  levothyroxine (SYNTHROID, LEVOTHROID) 75 MCG tablet Take 1 tablet (75 mcg total) by mouth daily. 01/19/18  Yes Ethelda Chick, MD  metFORMIN (GLUCOPHAGE) 1000 MG tablet Take 1 tablet (1,000 mg total) by mouth 2 (two) times daily with a meal. 06/03/17  Yes Ethelda Chick, MD  nitroGLYCERIN (NITROSTAT) 0.4 MG SL tablet Place 1 tablet (0.4 mg total) under the tongue every 5 (five) minutes as needed. Patient taking differently: Place 0.4 mg under the tongue every 5 (five) minutes as needed for chest pain.  05/24/17  Yes Ofilia Neas, PA-C  simvastatin (ZOCOR) 40 MG tablet Take 1 tablet (40 mg total) by mouth at bedtime. 06/03/17  Yes Ethelda Chick, MD  hydrochlorothiazide (HYDRODIURIL) 25 MG tablet Take 1 tablet (25 mg total) by mouth daily. Patient not taking: Reported on 01/02/2020 06/03/17   Ethelda Chick, MD    Inpatient Medications: Scheduled Meds: . carvedilol  12.5 mg Oral BID WC  . clopidogrel  75 mg Oral Daily  . enalapril  10 mg Oral BID  . enoxaparin (LOVENOX) injection  40 mg Subcutaneous Q24H  . furosemide  20 mg Intravenous Q12H  . insulin aspart  0-9 Units Subcutaneous TID WC  . levothyroxine  75 mcg Oral Q0600  . rOPINIRole  3 mg Oral QHS  . simvastatin  40 mg Oral QHS   Continuous Infusions:  PRN Meds: acetaminophen **OR** acetaminophen, nitroGLYCERIN, ondansetron **OR** ondansetron (ZOFRAN) IV, perflutren lipid microspheres (DEFINITY) IV suspension  Allergies:      Allergies  Allergen Reactions  . Penicillins Anaphylaxis  . Aspirin Hives  . Nsaids Hives  . Latex Rash    Social History:   Social History   Socioeconomic History  . Marital status: Divorced    Spouse name: Homero Fellers  . Number of children: 2  . Years of education: Not on file  . Highest education level: Not on file  Occupational History  . 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 Smoking status: Current Every Day Smoker    Packs/day: 0.50    Years: 30.00    Pack years: 15.00    Types: Cigarettes  . Smokeless tobacco: Current User  Substance and Sexual  Activity  . Alcohol use: Yes    Alcohol/week: 2.0 standard drinks    Types: 2 Cans of beer per week    Comment: twice a week.  . Drug use: No  . Sexual activity: Yes    Partners: Male    Birth control/protection: None  Other Topics Concern  . Not on file  Social History Narrative   Marital status: Divorced after 30 years of marriage in 2007.  Dating same gentleman/Frank x 9 years.   Children: 2 children, no grandchildren.  Oldest son with Down's syndrome.     Living: with boyfriend/Frank.   Employment:  Retired since 10/2016.    Tobacco: 1/2 ppd x 18 years.   Alcohol: 2 drinks on Thursday night/socially.   Drugs: none   Exercise: minimal walking in 2018 due to knee pain/OA.   Seatbelt: 100%; no texting      Social Determinants of Health   Financial Resource Strain:   . Difficulty of Paying Living Expenses:   Food Insecurity:   . Worried About Charity fundraiser in the Last Year:   . Arboriculturist in the Last Year:   Transportation Needs:   . Film/video editor (Medical):   Marland Kitchen Lack of Transportation (Non-Medical):   Physical Activity:   . Days of Exercise per Week:   . Minutes of Exercise per Session:   Stress:   . Feeling of Stress :   Social Connections:   . Frequency of Communication with Friends and Family:   . Frequency of  Social Gatherings with Friends and Family:   . Attends Religious Services:   . Active Member of Clubs or Organizations:   . Attends Archivist Meetings:   Marland Kitchen Marital Status:   Intimate Partner Violence:   . Fear of Current or Ex-Partner:   . Emotionally Abused:   Marland Kitchen Physically Abused:   . Sexually Abused:     Family History:    Family History  Problem Relation Age of Onset  . Dementia Mother   . Hypertension Mother   . Diabetes Mother   . Depression Father   . Heart disease Father 22       AMI age 10; CABG.  . Prostate cancer Father   . Hypertension Father   . Cancer Father        prostate cancer  . Hypertension Brother   . Arthritis Brother   . Heart disease Brother 66       AMI  . Diabetes Sister   . Heart disease Sister 58       mild AMI  . Hypertension Sister   . Multiple sclerosis Sister   . Down syndrome Son   . Colon cancer Maternal Aunt 33     ROS:  Please see the history of present illness.   All other ROS reviewed and negative.     Physical Exam/Data:   Vitals:   01/03/20 0145 01/03/20 0255 01/03/20 0300 01/03/20 0750  BP: 128/68 132/62 132/62 (!) 131/55  Pulse: 96  99 95  Resp: 18  20 20   Temp: 97.9 F (36.6 C)   98.4 F (36.9 C)  TempSrc: Oral   Oral  SpO2: 96%  96% 97%  Weight: 89.1 kg     Height: 5\' 7"  (1.702 m)       Intake/Output Summary (Last 24 hours) at 01/03/2020 1237 Last data filed at 01/03/2020 1000 Gross per 24 hour  Intake 955 ml  Output 1200 ml  Net -245 ml   Last 3 Weights 01/03/2020 01/02/2020 01/19/2018  Weight (lbs) 196 lb 6.9 oz 197 lb 207 lb  Weight (kg) 89.1 kg 89.359 kg 93.895 kg     Body mass index is 30.77 kg/m.  General:  Well nourished, well developed, in no acute distress HEENT: normal Lymph: no adenopathy Neck: no JVD Endocrine:  No thryomegaly Vascular: No carotid bruits Cardiac:  normal S1, S2; RRR; no murmur  Lungs:  clear to auscultation bilaterally, mild expiratory wheeze Abd: soft, nontender,  no hepatomegaly  Ext: no edema Musculoskeletal:  No deformities, BUE and BLE strength normal and equal Skin: warm and dry  Neuro:  CNs 2-12 intact, no focal abnormalities noted Psych:  Normal affect   EKG:  The EKG was personally reviewed and demonstrates:  ST 125 bpm, old anteroseptal infarct   Relevant CV Studies:  Echo: 01/03/20  IMPRESSIONS    1. Left ventricular ejection fraction, by estimation, is 40 to 45%. The  left ventricle has mildly decreased function. The left ventricle  demonstrates regional wall motion abnormalities (see scoring  diagram/findings for description). There is moderate  left ventricular hypertrophy. Left ventricular diastolic parameters are  consistent with Grade II diastolic dysfunction (pseudonormalization).  Elevated left ventricular end-diastolic pressure. There is moderate  hypokinesis of the left ventricular,  mid-apical inferior wall, apical segment and anterior wall.  2. Right ventricular systolic function is mildly reduced. The right  ventricular size is normal. There is normal pulmonary artery systolic  pressure.  3. The mitral valve is abnormal. Mild mitral valve regurgitation.  4. The aortic valve is tricuspid. Aortic valve regurgitation is not  visualized. Mild aortic valve sclerosis is present, with no evidence of  aortic valve stenosis.  5. The inferior vena cava is dilated in size with >50% respiratory  variability, suggesting right atrial pressure of 8 mmHg.   Laboratory Data:  High Sensitivity Troponin:   Recent Labs  Lab 01/02/20 1131 01/02/20 1738 01/02/20 2009  TROPONINIHS 21* 79* 79*     Chemistry Recent Labs  Lab 01/02/20 1131 01/03/20 0049 01/03/20 0633  NA 135  --  137  K 4.6  --  3.6  CL 101  --  98  CO2 23  --  27  GLUCOSE 174*  --  108*  BUN 10  --  12  CREATININE 0.96 0.91 0.92  CALCIUM 8.6*  --  8.9  GFRNONAA >60 >60 >60  GFRAA >60 >60 >60  ANIONGAP 11  --  12    Recent Labs  Lab  01/02/20 1131 01/03/20 0633  PROT 7.3 6.8  ALBUMIN 3.4* 3.3*  AST 19 17  ALT 11 8  ALKPHOS 100 90  BILITOT 0.6 0.5   Hematology Recent Labs  Lab 01/02/20 1131 01/03/20 0049  WBC 12.0* 8.8  RBC 4.21 3.74*  HGB 11.3* 10.0*  HCT 37.6 32.4*  MCV 89.3 86.6  MCH 26.8 26.7  MCHC 30.1 30.9  RDW 14.8 14.8  PLT 364 280   BNP Recent Labs  Lab 01/02/20 1738  BNP 1,165.0*    DDimer No results for input(s): DDIMER in the last 168 hours.   Radiology/Studies:  DG Chest 2 View  Result Date: 01/02/2020 CLINICAL DATA:  Shortness of breath following COVID vaccine. EXAM: CHEST - 2 VIEW COMPARISON:  None. FINDINGS: Normal heart size. Small volume of pleural fluid is identified with thickening of the fissures. Mild diffuse interstitial edema. No airspace consolidation. Osseous structures notable for multilevel thoracic degenerative disc disease.  IMPRESSION: 1. Mild CHF. Electronically Signed   By: Signa Kell M.D.   On: 01/02/2020 11:46   CT Angio Chest PE W/Cm &/Or Wo Cm  Result Date: 01/02/2020 CLINICAL DATA:  Shortness of breath. EXAM: CT ANGIOGRAPHY CHEST WITH CONTRAST TECHNIQUE: Multidetector CT imaging of the chest was performed using the standard protocol during bolus administration of intravenous contrast. Multiplanar CT image reconstructions and MIPs were obtained to evaluate the vascular anatomy. CONTRAST:  70 mL OMNIPAQUE IOHEXOL 350 MG/ML SOLN COMPARISON:  Chest x-ray dated 01/02/2020 FINDINGS: Cardiovascular: No pulmonary emboli. Extensive calcification in the left coronary artery. Heart size is normal. No pericardial effusion. Slight aortic atherosclerosis. Mediastinum/Nodes: There multiple enlarged left axillary lymph nodes as well as mediastinal adenopathy and bilateral hilar adenopathy. The largest node in the left axilla is 15 mm. There is a 12 mm node adjacent to the left side of the aortic arch. 14 mm node at the inferior aspect of the left hilum. Small nodule in the right lobe  of the thyroid gland, not requiring follow-up. Trachea and esophagus are normal. Lungs/Pleura: Small bilateral pleural effusions. Slight emphysematous disease in the upper lobes. Slight atelectasis at the lung bases. Upper Abdomen: No significant abnormality. Musculoskeletal: No chest wall abnormality. No acute or significant osseous findings. Review of the MIP images confirms the above findings. IMPRESSION: 1. No pulmonary emboli. 2. Small bilateral pleural effusions with slight bibasilar atelectasis. 3. Mediastinal and bilateral hilar adenopathy. Does the patient have a history of lymphoma? 4. Extensive calcification in the left coronary artery. 5. Slight emphysema in the upper lobes. Aortic Atherosclerosis (ICD10-I70.0) and Emphysema (ICD10-J43.9). Electronically Signed   By: Francene Boyers M.D.   On: 01/02/2020 19:09     Assessment and Plan:   Anna Noble is a 64 y.o. female with a hx of HTN, HL, DM, Depression, CAD s/p PCI of the LAD (2001), hypothyroidism, tobacco use who is being seen today for the evaluation of CHF at the request of Dr. Allena Katz.  1. Acute systolic HF: History of an EF around 33% back in 2001 with her MI.  Did have improvement to 50% on stress testing in 2007.  She has been on beta-blocker therapy and ACE prior to admission.  Denies any significant lower extremity edema or abdominal swelling.  CT on admission showed small bilateral pleural effusions, BNP >1100.  She has been diuresed with IV Lasix and reports significant improvement.  --Echo this admission with EF noted at 40 to 45% with moderate LVH hypokinesis of the left ventricle, mid apical inferior, and apical segment and anterior wall -- would continue on BB, and ACE. Add lasix 20mg  daily PRN as need for weight gain of 3lbs overnight, or 5 lbs in week, or shortness of breath -- she wishes to go home today, which seems reasonable to her improvement in respiratory status. -- will arrange for follow up in the office, which she  reports she will attend  2. CAD: hx of PCI to the LAD in 2001. Last Nuc was 2007. Continue with risk reduction on plavix, statin, BB and ACE  3. HL: on statin  4. HTN: blood pressures were elevated on admission. Improved now.   5. DM: on trulicity, glipizide and metformin.   6. Tobacco use: smoking up until admission. Says she is motivated to stop now.   For questions or updates, please contact [facility] Please consult [email or web address] for contact info under   MD to see  Signed, 2008, NP  01/03/2020 12:37  PM   ---------------------------------------------------------------------------------------------   History and all data above reviewed.  Patient examined.  I agree with the findings as above.  Anna Noble is a pleasant 64 year old female who presents after her second Covid shot with lymphadenopathy elevated BNP and mildly elevated high-sensitivity troponin.  History includes hypertension, hyperlipidemia, diabetes, CAD status post PCI of the LAD in 2001 requiring protection with intra-aortic balloon pump previously followed by Dr. Daleen Squibb and Dr. Antoine Poche.  Several years ago she endorsed angina but did not follow-up with stress testing.  Denies chest pain, however did have significant shortness of breath after receiving her second Covid vaccine.  Found to have small bilateral pleural effusions and bilateral hilar adenopathy as well as mediastinal adenopathy with sinus tachycardia.  Echocardiogram shows EF 40 to 45%, prior stress test with EF of 50%.  She feels completely back to baseline and would like to go home.   Constitutional: No acute distress Eyes: pupils equally round and reactive to light, sclera non-icteric, normal conjunctiva and lids ENMT: moist mucous membranes Cardiovascular: regular rhythm, normal rate, no murmurs. S1 and S2 normal. Radial pulses normal bilaterally. No jugular venous distention.  Respiratory: clear to auscultation bilaterally GI : normal  bowel sounds, soft and nontender. No distention.   MSK: extremities warm, well perfused. No edema.  NEURO: grossly nonfocal exam, moves all extremities. PSYCH: alert and oriented x 3, normal mood and affect.   All available labs, radiology testing, previous records reviewed. Agree with documented assessment and plan of my colleague as stated above with the following additions or changes:  Principal Problem:   Acute CHF (congestive heart failure) (HCC) Active Problems:   TOBACCO USER   Essential hypertension, benign   CAD, NATIVE VESSEL   Thyroid activity decreased   Type 2 diabetes mellitus with vascular disease (HCC)    Plan: She feels back to baseline and has had over 2 L of urine output given that I's and O's were not charted yesterday evening.  She did have signs of heart failure including elevated BNP and small bilateral pleural effusions.  We discussed indications for ongoing diuretics.  She would like to take these as needed as needed for shortness of breath and weight gain.  This is reasonable if she does daily weights and a low-sodium diet until I see her back in follow-up.  If she continues to have symptoms of shortness of breath, she should take scheduled Lasix until follow-up.  I have encouraged her to quit smoking she has cut back from 1 pack to half pack a day.  She is already on heart failure therapy with carvedilol and enalapril, we will discuss further steps at follow-up.  Continue home meds, please provide furosemide 20 mg po daily, or prn swelling or SOB per patient preference.   Length of Stay:  LOS: 1 day   Parke Poisson, MD [facility] 3:00 PM  01/03/2020

## 2020-01-03 NOTE — Progress Notes (Signed)
  Echocardiogram 2D Echocardiogram with definity has been performed.  Anna Noble M 01/03/2020, 12:00 PM

## 2020-01-03 NOTE — Plan of Care (Signed)

## 2020-01-03 NOTE — H&P (Signed)
History and Physical    Anna Noble DOB: 1956-05-24 DOA: 01/02/2020  PCP: Montez Hageman, DO  Patient coming from: Home.  Chief Complaint: Shortness of breath.  HPI: Anna Noble is a 64 y.o. female with history of CAD, hypertension, hypothyroidism, diabetes mellitus, ongoing tobacco abuse, COPD presents to the ER with complaint of shortness of breath.  Patient states she had Covid vaccination second dose yesterday following 3 hours later started getting short of breath.  Patient shortness of breath worsened with lying flat.  Denies any chest pain productive cough did have some mild fever.  Shortness of breath increased on exertion.  Given his persistent symptoms patient presented to the ER.  ED Course: In the ER patient had EKG done which showed showing sinus tachycardia.  CT angiogram of the chest was done which was negative for pulmonary embolism but did show bilateral pleural effusion and also lymphadenopathy.  Labs show BNP of 1100 high sensitive troponin of 21 and 79 WBC 12,000 hemoglobin 11.3.  Patient symptoms were consistent with acute CHF and was given 20 mg IV Lasix and admitted for acute CHF.  Review of Systems: As per HPI, rest all negative.   Past Medical History:  Diagnosis Date  . Acute myocardial infarction of other anterior wall, episode of care unspecified   . Allergy   . Arthritis   . Coronary atherosclerosis of native coronary artery   . Depression   . DM (diabetes mellitus) (HCC)   . Dysmetabolic syndrome X   . HLD (hyperlipidemia)   . HTN (hypertension)   . Hypothyroidism   . Neuromuscular disorder (HCC)   . Obesity, unspecified   . Tobacco use disorder     Past Surgical History:  Procedure Laterality Date  . ACHILLES TENDON SURGERY Left   . CARDIAC CATHETERIZATION    . Cardiac stenting    . EYE SURGERY    . HAND SURGERY Right    ganglion cyst, bone graft  . KNEE ARTHROSCOPY Right 1976  . STRABISMUS SURGERY Left 1959  . TOE SURGERY  Right    x 3  . TUBAL LIGATION       reports that she has been smoking cigarettes. She has a 15.00 pack-year smoking history. She uses smokeless tobacco. She reports current alcohol use of about 2.0 standard drinks of alcohol per week. She reports that she does not use drugs.  Allergies  Allergen Reactions  . Penicillins Anaphylaxis  . Aspirin Hives  . Nsaids Hives  . Latex Rash    Family History  Problem Relation Age of Onset  . Dementia Mother   . Hypertension Mother   . Diabetes Mother   . Depression Father   . Heart disease Father 85       AMI age 96; CABG.  . Prostate cancer Father   . Hypertension Father   . Cancer Father        prostate cancer  . Hypertension Brother   . Arthritis Brother   . Heart disease Brother 35       AMI  . Diabetes Sister   . Heart disease Sister 37       mild AMI  . Hypertension Sister   . Multiple sclerosis Sister   . Down syndrome Son   . Colon cancer Maternal Aunt 45    Prior to Admission medications   Medication Sig Start Date End Date Taking? Authorizing Provider  albuterol (PROVENTIL HFA;VENTOLIN HFA) 108 (90 Base) MCG/ACT inhaler Inhale  2 puffs into the lungs every 4 (four) hours as needed for wheezing or shortness of breath (cough, shortness of breath or wheezing.). 11/15/17  Yes Timmothy Euler, [place] D, PA-C  carvedilol (COREG) 12.5 MG tablet Take 1 tablet (12.5 mg total) by mouth 2 (two) times daily. 06/03/17  Yes Wardell Honour, MD  clopidogrel (PLAVIX) 75 MG tablet Take 1 tablet (75 mg total) by mouth daily. 07/12/17  Yes Wardell Honour, MD  cyclobenzaprine (FLEXERIL) 5 MG tablet Take 1 tablet (5 mg total) by mouth 3 (three) times daily as needed. 01/19/18  Yes Wardell Honour, MD  Dulaglutide (TRULICITY) 3.41 PF/7.9KW SOPN Inject 0.75 mg into the skin every 7 (seven) days.    Yes [provider]  enalapril (VASOTEC) 10 MG tablet Take 1 tablet (10 mg total) by mouth 2 (two) times daily. 06/03/17  Yes Wardell Honour, MD    glipiZIDE (GLUCOTROL XL) 10 MG 24 hr tablet Take 1 tablet (10 mg total) by mouth daily with breakfast. 01/19/18  Yes Wardell Honour, MD  levothyroxine (SYNTHROID, LEVOTHROID) 75 MCG tablet Take 1 tablet (75 mcg total) by mouth daily. 01/19/18  Yes Wardell Honour, MD  metFORMIN (GLUCOPHAGE) 1000 MG tablet Take 1 tablet (1,000 mg total) by mouth 2 (two) times daily with a meal. 06/03/17  Yes Wardell Honour, MD  nitroGLYCERIN (NITROSTAT) 0.4 MG SL tablet Place 1 tablet (0.4 mg total) under the tongue every 5 (five) minutes as needed. Patient taking differently: Place 0.4 mg under the tongue every 5 (five) minutes as needed for chest pain.  05/24/17  Yes [name], PA-C  simvastatin (ZOCOR) 40 MG tablet Take 1 tablet (40 mg total) by mouth at bedtime. 06/03/17  Yes Wardell Honour, MD  hydrochlorothiazide (HYDRODIURIL) 25 MG tablet Take 1 tablet (25 mg total) by mouth daily. Patient not taking: Reported on 01/02/2020 06/03/17   Wardell Honour, MD    Physical Exam: Constitutional: Moderately built and nourished. Vitals:   01/02/20 2315 01/02/20 2330 01/02/20 2345 01/03/20 0000  BP: 138/69 138/74 135/65 140/71  Pulse: (!) 105 (!) 104 100 92  Resp: 16 (!) 22 16 15   Temp:      TempSrc:      SpO2: 96% 95% 93% 93%  Weight:      Height:       Eyes: Anicteric no pallor. ENMT: No discharge from the ears eyes nose or mouth. Neck: No mass felt.  No neck rigidity. Respiratory: No rhonchi or crepitations. Cardiovascular: S1-S2 heard. Abdomen: Soft nontender bowel sounds present. Musculoskeletal: No edema. Skin: No rash. Neurologic: Alert awake oriented to time place and person.  Moves all extremities. Psychiatric: Appears normal per normal affect.   Labs on Admission: I have personally reviewed following labs and imaging studies  CBC: Recent Labs  Lab 01/02/20 1131  WBC 12.0*  NEUTROABS 9.1*  HGB 11.3*  HCT 37.6  MCV 89.3  PLT 409   Basic Metabolic Panel: Recent Labs  Lab  01/02/20 1131  NA 135  K 4.6  CL 101  CO2 23  GLUCOSE 174*  BUN 10  CREATININE 0.96  CALCIUM 8.6*   GFR: Estimated Creatinine Clearance: 67.1 mL/min (by C-G formula based on SCr of 0.96 mg/dL). Liver Function Tests: Recent Labs  Lab 01/02/20 1131  AST 19  ALT 11  ALKPHOS 100  BILITOT 0.6  PROT 7.3  ALBUMIN 3.4*   No results for input(s): LIPASE, AMYLASE in the last 168 hours. No  results for input(s): AMMONIA in the last 168 hours. Coagulation Profile: Recent Labs  Lab 01/02/20 1131  INR 1.0   Cardiac Enzymes: No results for input(s): CKTOTAL, CKMB, CKMBINDEX, TROPONINI in the last 168 hours. BNP (last 3 results) No results for input(s): PROBNP in the last 8760 hours. HbA1C: No results for input(s): HGBA1C in the last 72 hours. CBG: No results for input(s): GLUCAP in the last 168 hours. Lipid Profile: No results for input(s): CHOL, HDL, LDLCALC, TRIG, CHOLHDL, LDLDIRECT in the last 72 hours. Thyroid Function Tests: No results for input(s): TSH, T4TOTAL, FREET4, T3FREE, THYROIDAB in the last 72 hours. Anemia Panel: No results for input(s): VITAMINB12, FOLATE, FERRITIN, TIBC, IRON, RETICCTPCT in the last 72 hours. Urine analysis:    Component Value Date/Time   COLORURINE YELLOW 10/24/2017 1550   APPEARANCEUR CLEAR 10/24/2017 1550   APPEARANCEUR Clear 05/26/2017 1558   LABSPEC 1.006 10/24/2017 1550   PHURINE 6.0 10/24/2017 1550   GLUCOSEU NEGATIVE 10/24/2017 1550   HGBUR MODERATE (A) 10/24/2017 1550   BILIRUBINUR NEGATIVE 10/24/2017 1550   BILIRUBINUR Negative 05/26/2017 1558   KETONESUR NEGATIVE 10/24/2017 1550   PROTEINUR NEGATIVE 10/24/2017 1550   UROBILINOGEN 0.2 05/05/2016 0927   NITRITE NEGATIVE 10/24/2017 1550   LEUKOCYTESUR LARGE (A) 10/24/2017 1550   LEUKOCYTESUR Negative 05/26/2017 1558   Sepsis Labs: @LABRCNTIP (procalcitonin:4,lacticidven:4) )No results found for this or any previous visit (from the past 240 hour(s)).   Radiological Exams on  Admission: DG Chest 2 View  Result Date: 01/02/2020 CLINICAL DATA:  Shortness of breath following COVID vaccine. EXAM: CHEST - 2 VIEW COMPARISON:  None. FINDINGS: Normal heart size. Small volume of pleural fluid is identified with thickening of the fissures. Mild diffuse interstitial edema. No airspace consolidation. Osseous structures notable for multilevel thoracic degenerative disc disease. IMPRESSION: 1. Mild CHF. Electronically Signed   By: 03/03/2020 M.D.   On: 01/02/2020 11:46   CT Angio Chest PE W/Cm &/Or Wo Cm  Result Date: 01/02/2020 CLINICAL DATA:  Shortness of breath. EXAM: CT ANGIOGRAPHY CHEST WITH CONTRAST TECHNIQUE: Multidetector CT imaging of the chest was performed using the standard protocol during bolus administration of intravenous contrast. Multiplanar CT image reconstructions and MIPs were obtained to evaluate the vascular anatomy. CONTRAST:  70 mL OMNIPAQUE IOHEXOL 350 MG/ML SOLN COMPARISON:  Chest x-ray dated 01/02/2020 FINDINGS: Cardiovascular: No pulmonary emboli. Extensive calcification in the left coronary artery. Heart size is normal. No pericardial effusion. Slight aortic atherosclerosis. Mediastinum/Nodes: There multiple enlarged left axillary lymph nodes as well as mediastinal adenopathy and bilateral hilar adenopathy. The largest node in the left axilla is 15 mm. There is a 12 mm node adjacent to the left side of the aortic arch. 14 mm node at the inferior aspect of the left hilum. Small nodule in the right lobe of the thyroid gland, not requiring follow-up. Trachea and esophagus are normal. Lungs/Pleura: Small bilateral pleural effusions. Slight emphysematous disease in the upper lobes. Slight atelectasis at the lung bases. Upper Abdomen: No significant abnormality. Musculoskeletal: No chest wall abnormality. No acute or significant osseous findings. Review of the MIP images confirms the above findings. IMPRESSION: 1. No pulmonary emboli. 2. Small bilateral pleural  effusions with slight bibasilar atelectasis. 3. Mediastinal and bilateral hilar adenopathy. Does the patient have a history of lymphoma? 4. Extensive calcification in the left coronary artery. 5. Slight emphysema in the upper lobes. Aortic Atherosclerosis (ICD10-I70.0) and Emphysema (ICD10-J43.9). Electronically Signed   By: 03/03/2020 M.D.   On: 01/02/2020 19:09  EKG: Independently reviewed.  Sinus tachycardia.  Assessment/Plan Principal Problem:   Acute CHF (congestive heart failure) (HCC) Active Problems:   TOBACCO USER   Essential hypertension, benign   CAD, NATIVE VESSEL   Thyroid activity decreased   Type 2 diabetes mellitus with vascular disease (HCC)    1. Acute CHF unknown EF last EF per the cardiology notes was around 50%.  Presently we have placed patient on Lasix 20 mg IV every 12.  Closely follow intake output metabolic panel daily weights.  Patient is on ACE inhibitor.  Cardiology notified. 2. History of CAD denies any chest pain.  Patient is on carvedilol Plavix statins.  Follow 2D echo. 3. Hypertension on carvedilol and enalapril.  4. Hypothyroidism on Synthroid. 5. Diabetes mellitus type 2 we will keep patient on sliding scale coverage. 6. Tobacco abuse advised about quitting. 7. Normocytic normochromic anemia appears to be new.  Follow CBC.  May need further if there is further decline. 8. Lymphadenopathy seen in the CAT scan of the chest will need further work-up.  Given that patient has acute CHF with a mildly elevated troponin will need close monitoring for any further deterioration in inpatient status.   DVT prophylaxis: Lovenox. Code Status: Full code. Family Communication: Discussed with patient. Disposition Plan: Home. Consults called: Cardiology. Admission status: Inpatient.   Eduard Clos MD [facility] Pager [phone].  If 7PM-7AM, please contact night-coverage [email or web address] Password [facility]  01/03/2020, 12:50 AM

## 2020-01-03 NOTE — Progress Notes (Signed)
Patient IV removed, discharge instructions reviewed, patient verbalized understanding of instructions and follow up appointments. Patient via wheelchair to waiting car in stable condition

## 2020-01-03 NOTE — Care Management Obs Status (Signed)
MEDICARE OBSERVATION STATUS NOTIFICATION   Patient Details  Name: Anna Noble MRN: [id number] Date of Birth: 1956-07-27   Medicare Observation Status Notification Given:  Yes    Leone Haven, RN 01/03/2020, 4:19 PM

## 2020-01-03 NOTE — Discharge Instructions (Signed)
Shortness of Breath, Adult Shortness of breath means you have trouble breathing. Shortness of breath could be a sign of a medical problem. Follow these instructions at home:   Watch for any changes in your symptoms.  Do not use any products that contain nicotine or tobacco, such as cigarettes, e-cigarettes, and chewing tobacco.  Do not smoke. Smoking can cause shortness of breath. If you need help to quit smoking, ask your doctor.  Avoid things that can make it harder to breathe, such as: ? Mold. ? Dust. ? Air pollution. ? Chemical smells. ? Things that can cause allergy symptoms (allergens), if you have allergies.  Keep your living space clean. Use products that help remove mold and dust.  Rest as needed. Slowly return to your normal activities.  Take over-the-counter and prescription medicines only as told by your doctor. This includes oxygen therapy and inhaled medicines.  Keep all follow-up visits as told by your doctor. This is important. Contact a doctor if:  Your condition does not get better as soon as expected.  You have a hard time doing your normal activities, even after you rest.  You have new symptoms. Get help right away if:  Your shortness of breath gets worse.  You have trouble breathing when you are resting.  You feel light-headed or you pass out (faint).  You have a cough that is not helped by medicines.  You cough up blood.  You have pain with breathing.  You have pain in your chest, arms, shoulders, or belly (abdomen).  You have a fever.  You cannot walk up stairs.  You cannot exercise the way you normally do. These symptoms may represent a serious problem that is an emergency. Do not wait to see if the symptoms will go away. Get medical help right away. Call your local emergency services (911 in the [place] Do not drive yourself to the hospital. Summary  Shortness of breath is when you have trouble breathing enough air. It can be a sign of a  medical problem.  Avoid things that make it hard for you to breathe, such as smoking, pollution, mold, and dust.  Watch for any changes in your symptoms. Contact your doctor if you do not get better or you get worse. This information is not intended to replace advice given to you by your health care provider. Make sure you discuss any questions you have with your health care provider. Document Revised: 02/14/2018 Document Reviewed: 02/14/2018 [employer]vier Patient Education  2020 [employer]  

## 2020-01-04 NOTE — Discharge Summary (Signed)
[facility] Discharge Summary   Patient: Anna Noble GEX:[id number]  PCP: Montez Hageman, DO  Date of admission: 01/02/2020   Date of discharge: 01/03/2020      Discharge Diagnoses:  Principal Problem:   Acute CHF (congestive heart failure) (HCC) Active Problems:   TOBACCO USER   Essential hypertension, benign   CAD, NATIVE VESSEL   Thyroid activity decreased   Type 2 diabetes mellitus with vascular disease (HCC)   Acute on chronic systolic (congestive) heart failure (HCC)  Admitted From: home Disposition:  Home   Recommendations for Outpatient Follow-up:  1. PCP: please follow up with PCP in 1 week 2. Follow up LABS/TEST:  Repeat CBC and BMP. May need further work up for anemia.  3. Pt will need further work up for incidentally seen lymphadenopathy.  Follow-up Information    Jodelle Gross, NP Follow up on 01/10/2020.   Specialties: Nurse Practitioner, Radiology, Cardiology Why: at 10:45am for your follow up appt Contact information: [address] [place] [address] [phone]        Montez Hageman, DO. Schedule an appointment as soon as possible for a visit in 1 week(s).   Specialty: Family Medicine Contact information: [phone] [place] [address] [phone]          Diet recommendation: Cardiac diet  Activity: The patient is advised to gradually reintroduce usual activities, as tolerated  Discharge Condition: stable  Code Status: Full code   History of present illness: As per the H and P dictated on admission, " Anna Noble is a 64 y.o. female with history of CAD, hypertension, hypothyroidism, diabetes mellitus, ongoing tobacco abuse, COPD presents to the ER with complaint of shortness of breath.  Patient states she had Covid vaccination second dose yesterday following 3 hours later started getting short of breath.  Patient shortness of breath worsened with lying flat.  Denies any chest pain productive cough did have  some mild fever.  Shortness of breath increased on exertion.  Given his persistent symptoms patient presented to the ER."  Hospital Course:  Summary of her active problems in the hospital is as following. Acute systolic CHF:  History of an EF around 33% back in 2001 with her MI.  Did have improvement to 50% on stress testing in 2007. CT on admission showed small bilateral pleural effusions, BNP >1100.   She has been diuresed with IV Lasix and reports significant improvement.  Echo this admission with EF noted at 40 to 45% with moderate LVH hypokinesis of the left ventricle, mid apical inferior, and apical segment and anterior wall Cardiology consulted and recommendation were  To continue on BB, and ACE. Add lasix 20mg  daily PRN  Given that pt still has bilateral crackles up to mid zone, For now I will continue it scheduled for next few days, pt will follow up with PCP and change to PRN can be considered at that visit  History of CAD denies any chest pain.  Patient is on carvedilol Plavix statins  Hypertension carvedilol and enalapril.   Hypothyroidism  Synthroid.  Diabetes mellitus type 2  Continue home meds.   Tobacco abuse advised about quitting. Nicotine patch added   Normocytic normochromic anemia  appears to be new.  No bleeding reported,  Repeat CBC outpt and further work up depending on that.  Lymphadenopathy seen in the CAT scan of the chest Currently no symptoms  will need further work-up outpatient  1. Body mass index is  30.77 kg/m.   Patient was ambulatory without any assistance. On the day of the discharge the patient's vitals were stable, and no other acute medical condition were reported by patient. the patient was felt safe to be discharge at Home with no therapy needed on discharge.  Consultants: Cardiology  Procedures: Echocardiogram   Discharge Exam: General: Appear in no distress, no Rash; Oral Mucosa Clear, moist. Cardiovascular: S1 and S2  Present, no Murmur, Respiratory: normal respiratory effort, Bilateral Air entry present and bilateral  Crackles, no wheezes Abdomen: Bowel Sound present, Soft and no tenderness, no hernia Extremities: no Pedal edema, no calf tenderness Neurology: alert and oriented to time, place, and person affect appropriate.  Filed Weights   01/02/20 1124 01/03/20 0145  Weight: 89.4 kg 89.1 kg   Vitals:   01/03/20 0300 01/03/20 0750  BP: 132/62 (!) 131/55  Pulse: 99 95  Resp: 20 20  Temp:  98.4 F (36.9 C)  SpO2: 96% 97%    DISCHARGE MEDICATION: Allergies as of 01/03/2020      Reactions   Penicillins Anaphylaxis   Aspirin Hives   Nsaids Hives   Latex Rash      Medication List    STOP taking these medications   hydrochlorothiazide 25 MG tablet Commonly known as: HYDRODIURIL     TAKE these medications   albuterol 108 (90 Base) MCG/ACT inhaler Commonly known as: VENTOLIN HFA Inhale 2 puffs into the lungs every 4 (four) hours as needed for wheezing or shortness of breath (cough, shortness of breath or wheezing.).   carvedilol 12.5 MG tablet Commonly known as: COREG Take 1 tablet (12.5 mg total) by mouth 2 (two) times daily.   clopidogrel 75 MG tablet Commonly known as: PLAVIX Take 1 tablet (75 mg total) by mouth daily.   cyclobenzaprine 5 MG tablet Commonly known as: FLEXERIL Take 1 tablet (5 mg total) by mouth 3 (three) times daily as needed.   enalapril 10 MG tablet Commonly known as: VASOTEC Take 1 tablet (10 mg total) by mouth 2 (two) times daily.   furosemide 20 MG tablet Commonly known as: Lasix Take 2 tablets (40 mg total) by mouth daily.   glipiZIDE 10 MG 24 hr tablet Commonly known as: GLUCOTROL XL Take 1 tablet (10 mg total) by mouth daily with breakfast.   levothyroxine 75 MCG tablet Commonly known as: SYNTHROID Take 1 tablet (75 mcg total) by mouth daily.   metFORMIN 1000 MG tablet Commonly known as: GLUCOPHAGE Take 1 tablet (1,000 mg total) by mouth 2  (two) times daily with a meal.   nicotine 14 mg/24hr patch Commonly known as: NICODERM CQ - dosed in mg/24 hours Place 1 patch (14 mg total) onto the skin daily.   nitroGLYCERIN 0.4 MG SL tablet Commonly known as: NITROSTAT Place 1 tablet (0.4 mg total) under the tongue every 5 (five) minutes as needed. What changed: reasons to take this   simvastatin 40 MG tablet Commonly known as: ZOCOR Take 1 tablet (40 mg total) by mouth at bedtime.   Trulicity 0.75 MG/0.5ML Sopn Generic drug: Dulaglutide Inject 0.75 mg into the skin every 7 (seven) days.      Allergies  Allergen Reactions  . Penicillins Anaphylaxis  . Aspirin Hives  . Nsaids Hives  . Latex Rash   Discharge Instructions    Diet - low sodium heart healthy   Complete by: As directed    Increase activity slowly   Complete by: As directed       The  results of significant diagnostics from this hospitalization (including imaging, microbiology, ancillary and laboratory) are listed below for reference.    Significant Diagnostic Studies: DG Chest 2 View  Result Date: 01/02/2020 CLINICAL DATA:  Shortness of breath following COVID vaccine. EXAM: CHEST - 2 VIEW COMPARISON:  None. FINDINGS: Normal heart size. Small volume of pleural fluid is identified with thickening of the fissures. Mild diffuse interstitial edema. No airspace consolidation. Osseous structures notable for multilevel thoracic degenerative disc disease. IMPRESSION: 1. Mild CHF. Electronically Signed   By: Signa Kell M.D.   On: 01/02/2020 11:46   CT Angio Chest PE W/Cm &/Or Wo Cm  Result Date: 01/02/2020 CLINICAL DATA:  Shortness of breath. EXAM: CT ANGIOGRAPHY CHEST WITH CONTRAST TECHNIQUE: Multidetector CT imaging of the chest was performed using the standard protocol during bolus administration of intravenous contrast. Multiplanar CT image reconstructions and MIPs were obtained to evaluate the vascular anatomy. CONTRAST:  70 mL OMNIPAQUE IOHEXOL 350 MG/ML SOLN  COMPARISON:  Chest x-ray dated 01/02/2020 FINDINGS: Cardiovascular: No pulmonary emboli. Extensive calcification in the left coronary artery. Heart size is normal. No pericardial effusion. Slight aortic atherosclerosis. Mediastinum/Nodes: There multiple enlarged left axillary lymph nodes as well as mediastinal adenopathy and bilateral hilar adenopathy. The largest node in the left axilla is 15 mm. There is a 12 mm node adjacent to the left side of the aortic arch. 14 mm node at the inferior aspect of the left hilum. Small nodule in the right lobe of the thyroid gland, not requiring follow-up. Trachea and esophagus are normal. Lungs/Pleura: Small bilateral pleural effusions. Slight emphysematous disease in the upper lobes. Slight atelectasis at the lung bases. Upper Abdomen: No significant abnormality. Musculoskeletal: No chest wall abnormality. No acute or significant osseous findings. Review of the MIP images confirms the above findings. IMPRESSION: 1. No pulmonary emboli. 2. Small bilateral pleural effusions with slight bibasilar atelectasis. 3. Mediastinal and bilateral hilar adenopathy. Does the patient have a history of lymphoma? 4. Extensive calcification in the left coronary artery. 5. Slight emphysema in the upper lobes. Aortic Atherosclerosis (ICD10-I70.0) and Emphysema (ICD10-J43.9). Electronically Signed   By: Francene Boyers M.D.   On: 01/02/2020 19:09   ECHOCARDIOGRAM COMPLETE  Result Date: 01/03/2020    ECHOCARDIOGRAM REPORT   Patient Name:   Anna Noble Date of Exam: 01/03/2020 Medical Rec #:  [id number]    Height:       67.0 in Accession #:    [id number]   Weight:       196.4 lb Date of Birth:  [birth year]    BSA:          2.007 m Patient Age:    64 years     BP:           131/55 mmHg Patient Gender: F            HR:           95 bpm. Exam Location:  Inpatient Procedure: 2D Echo and Intracardiac Opacification Agent Indications:    Congestive Heart Failure 428.0 / I50.9  History:        Patient has no  prior history of Echocardiogram examinations. CAD                 and Previous Myocardial Infarction; Risk Factors:Current Smoker,                 Dyslipidemia, Hypertension and Diabetes. Thyroid disease.  Sonographer:    Leta Jungling RDCS Referring Phys: [id number] ARSHAD  N KAKRAKANDY IMPRESSIONS  1. Left ventricular ejection fraction, by estimation, is 40 to 45%. The left ventricle has mildly decreased function. The left ventricle demonstrates regional wall motion abnormalities (see scoring diagram/findings for description). There is moderate left ventricular hypertrophy. Left ventricular diastolic parameters are consistent with Grade II diastolic dysfunction (pseudonormalization). Elevated left ventricular end-diastolic pressure. There is moderate hypokinesis of the left ventricular, mid-apical inferior wall, apical segment and anterior wall.  2. Right ventricular systolic function is mildly reduced. The right ventricular size is normal. There is normal pulmonary artery systolic pressure.  3. The mitral valve is abnormal. Mild mitral valve regurgitation.  4. The aortic valve is tricuspid. Aortic valve regurgitation is not visualized. Mild aortic valve sclerosis is present, with no evidence of aortic valve stenosis.  5. The inferior vena cava is dilated in size with >50% respiratory variability, suggesting right atrial pressure of 8 mmHg. FINDINGS  Left Ventricle: Left ventricular ejection fraction, by estimation, is 40 to 45%. The left ventricle has mildly decreased function. The left ventricle demonstrates regional wall motion abnormalities. Moderate hypokinesis of the left ventricular, mid-apical inferior wall, apical segment and anterior wall. Definity contrast agent was given IV to delineate the left ventricular endocardial borders. The left ventricular internal cavity size was normal in size. There is moderate left ventricular hypertrophy. Left ventricular diastolic parameters are consistent with Grade II  diastolic dysfunction (pseudonormalization). Elevated left ventricular end-diastolic pressure.  LV Wall Scoring: The mid and distal anterior wall, mid and distal anterior septum, and entire apex are hypokinetic. Right Ventricle: The right ventricular size is normal. No increase in right ventricular wall thickness. Right ventricular systolic function is mildly reduced. There is normal pulmonary artery systolic pressure. The tricuspid regurgitant velocity is 2.01 m/s, and with an assumed right atrial pressure of 8 mmHg, the estimated right ventricular systolic pressure is 24.2 mmHg. Left Atrium: Left atrial size was normal in size. Right Atrium: Right atrial size was normal in size. Pericardium: Trivial pericardial effusion is present. The pericardial effusion is posterior to the left ventricle. Mitral Valve: The mitral valve is abnormal. There is mild thickening of the mitral valve leaflet(s). There is mild calcification of the posterior mitral valve leaflet(s). Mild mitral annular calcification. Mild mitral valve regurgitation. Tricuspid Valve: The tricuspid valve is grossly normal. Tricuspid valve regurgitation is trivial. Aortic Valve: The aortic valve is tricuspid. Aortic valve regurgitation is not visualized. Mild aortic valve sclerosis is present, with no evidence of aortic valve stenosis. Pulmonic Valve: The pulmonic valve was normal in structure. Pulmonic valve regurgitation is not visualized. Aorta: The aortic root and ascending aorta are structurally normal, with no evidence of dilitation. Venous: The inferior vena cava is dilated in size with greater than 50% respiratory variability, suggesting right atrial pressure of 8 mmHg. IAS/Shunts: No atrial level shunt detected by color flow Doppler.  LEFT VENTRICLE PLAX 2D LVIDd:         4.32 cm      Diastology LVIDs:         3.29 cm      LV e' lateral:   8.03 cm/s LV PW:         1.40 cm      LV E/e' lateral: 11.7 LV IVS:        1.42 cm      LV e' medial:    5.33  cm/s LVOT diam:     1.80 cm      LV E/e' medial:  17.6 LV SV:  28 LV SV Index:   14 LVOT Area:     2.54 cm  LV Volumes (MOD) LV vol d, MOD A2C: 160.0 ml LV vol d, MOD A4C: 109.5 ml LV vol s, MOD A2C: 99.6 ml LV vol s, MOD A4C: 56.2 ml LV SV MOD A2C:     60.4 ml LV SV MOD A4C:     109.5 ml LV SV MOD BP:      57.1 ml RIGHT VENTRICLE RV S prime:     7.95 cm/s TAPSE (M-mode): 1.4 cm LEFT ATRIUM             Index       RIGHT ATRIUM           Index LA diam:        4.70 cm 2.34 cm/m  RA Area:     12.00 cm LA Vol (A2C):   76.7 ml 38.22 ml/m RA Volume:   25.10 ml  12.51 ml/m LA Vol (A4C):   52.2 ml 26.01 ml/m LA Biplane Vol: 65.9 ml 32.84 ml/m  AORTIC VALVE LVOT Vmax:   60.70 cm/s LVOT Vmean:  39.600 cm/s LVOT VTI:    0.110 m  AORTA Ao Root diam: 2.50 cm MITRAL VALVE               TRICUSPID VALVE MV Area (PHT): 5.62 cm    TR Peak grad:   16.2 mmHg MV Decel Time: 135 msec    TR Vmax:        201.00 cm/s MR Peak grad: 68.6 mmHg MR Mean grad: 49.0 mmHg    SHUNTS MR Vmax:      414.00 cm/s  Systemic VTI:  0.11 m MR Vmean:     337.0 cm/s   Systemic Diam: 1.80 cm MV E velocity: 93.80 cm/s MV A velocity: 47.60 cm/s MV E/A ratio:  1.97 Lyman Bishop MD Electronically signed by Lyman Bishop MD Signature Date/Time: 01/03/2020/12:44:09 PM    Final     Microbiology: Recent Results (from the past 240 hour(s))  SARS CORONAVIRUS 2 (TAT 6-24 HRS) Nasopharyngeal Nasopharyngeal Swab     Status: None   Collection Time: 01/02/20  8:25 PM   Specimen: Nasopharyngeal Swab  Result Value Ref Range Status   SARS Coronavirus 2 NEGATIVE NEGATIVE Final    Comment: (NOTE) SARS-CoV-2 target nucleic acids are NOT DETECTED. The SARS-CoV-2 RNA is generally detectable in upper and lower respiratory specimens during the acute phase of infection. Negative results do not preclude SARS-CoV-2 infection, do not rule out co-infections with other pathogens, and should not be used as the sole basis for treatment or other patient management  decisions. Negative results must be combined with clinical observations, patient history, and epidemiological information. The expected result is Negative. Fact Sheet for Patients: SugarRoll.be Fact Sheet for Healthcare Providers: https://www.woods-mathews.com/ This test is not yet approved or cleared by the [place] FDA and  has been authorized for detection and/or diagnosis of SARS-CoV-2 by FDA under an Emergency Use Authorization (EUA). This EUA will remain  in effect (meaning this test can be used) for the duration of the COVID-19 declaration under Section 56 4(b)(1) of the Act, 21 U.S.C. section 360bbb-3(b)(1), unless the authorization is terminated or revoked sooner. Performed at [facility], [place] [address], [place], [place] [address]   MRSA PCR Screening     Status: None   Collection Time: 01/03/20  2:15 AM   Specimen: Nasal Mucosa; Nasopharyngeal  Result Value Ref Range Status  MRSA by PCR NEGATIVE NEGATIVE Final    Comment:        The GeneXpert MRSA Assay (FDA approved for NASAL specimens only), is one component of a comprehensive MRSA colonization surveillance program. It is not intended to diagnose MRSA infection nor to guide or monitor treatment for MRSA infections. Performed at [facility], [address] [place], [place] [address]      Labs: CBC: Recent Labs  Lab 01/02/20 1131 01/03/20 0049  WBC 12.0* 8.8  NEUTROABS 9.1*  --   HGB 11.3* 10.0*  HCT 37.6 32.4*  MCV 89.3 86.6  PLT 364 280   Basic Metabolic Panel: Recent Labs  Lab 01/02/20 1131 01/03/20 0049 01/03/20 0633  NA 135  --  137  K 4.6  --  3.6  CL 101  --  98  CO2 23  --  27  GLUCOSE 174*  --  108*  BUN 10  --  12  CREATININE 0.96 0.91 0.92  CALCIUM 8.6*  --  8.9  MG  --  1.7 1.8   Liver Function Tests: Recent Labs  Lab 01/02/20 1131 01/03/20 0633  AST 19 17  ALT 11 8  ALKPHOS 100 90  BILITOT 0.6 0.5  PROT 7.3  6.8  ALBUMIN 3.4* 3.3*   No results for input(s): LIPASE, AMYLASE in the last 168 hours. No results for input(s): AMMONIA in the last 168 hours. Cardiac Enzymes: No results for input(s): CKTOTAL, CKMB, CKMBINDEX, TROPONINI in the last 168 hours. BNP (last 3 results) Recent Labs    01/02/20 1738  BNP 1,165.0*   CBG: Recent Labs  Lab 01/03/20 0145 01/03/20 0649 01/03/20 1154  GLUCAP 101* 116* 128*    Time spent: 35 minutes  Signed:  Lynden OxfordPranav Carolin Quang  [facility] 01/03/2020

## 2020-01-09 NOTE — Progress Notes (Signed)
Cardiology Office Note   Date:  01/10/2020   ID:  Anna Noble, DOB Aug 15, 1956, MRN [id number]  PCP:  Gara Kroner, DO  Cardiologist: Dr.Acharya, (formerly Dr. Percival Spanish but not seen since 2015).  CC; Follow up   History of Present Illness: Anna Noble is a 63 y.o. female who presents for ongoing assessment and management of CAD, catheterization in 2001 showing LAD stenosis of 90%, second diagonal with 99% stenosis and ramus with 30% lesion.  Underwent stenting of the LAD which required a balloon pump.  Had a stress test in 2017 with EF noted at 50%.   She was seen on consultation by Dr. Margaretann Loveless while hospitalized for worsening dyspnea. She had recently had COVID vaccine and with severe dyspnea after injection . CT chest showed small bilateral pleural effusions with atelectasis along with pleural effusions catheterization in 2001 showing LAD stenosis of 90%, second diagonal with 99% stenosis and ramus with 30% lesion.  Underwent stenting of the LAD which required a balloon pump.  Had a stress test in 2017 with EF noted at 50%. She was given IV Lasix in the ED and admitted to internal medicine for further work-up.  Echocardiogram showed EF of 40 to 45% with moderate LVH hypokinesis of the left ventricle, mid apical inferior, and apical segment and anterior wall.  She was to have lasix 20 mg prn for fluid retention in the setting of acute CHF on admission.. Echocardiogram revealed EF of 40%-45% withmoderate LVH hypokinesis of the left ventricle, mid apical inferior, and apical segment and anterior wall.  Prior to discharge medications were again manipulated and she was placed on 40 mg of Lasix daily, enalapril was increased to 10 mg twice daily and carvedilol was decreased to 12.5 mg twice daily.  She comes today without any complaints.  She has no chest pain dyspnea on exertion or fluid retention.  She has had recent labs drawn by her primary care physician which we will try and locate.  He is due  to see her tomorrow.  On last review of labs on 01/03/2020 creatinine was normal.   Past Medical History:  Diagnosis Date  . Acute myocardial infarction of other anterior wall, episode of care unspecified   . Allergy   . Arthritis   . Coronary atherosclerosis of native coronary artery   . Depression   . DM (diabetes mellitus) ([place])   . Dysmetabolic syndrome X   . HLD (hyperlipidemia)   . HTN (hypertension)   . Hypothyroidism   . Neuromuscular disorder ([place])   . Obesity, unspecified   . Tobacco use disorder     Past Surgical History:  Procedure Laterality Date  . ACHILLES TENDON SURGERY Left   . CARDIAC CATHETERIZATION    . Cardiac stenting    . EYE SURGERY    . HAND SURGERY Right    ganglion cyst, bone graft  . KNEE ARTHROSCOPY Right 1976  . STRABISMUS SURGERY Left 1959  . TOE SURGERY Right    x 3  . TUBAL LIGATION       Current Outpatient Medications  Medication Sig Dispense Refill  . albuterol (PROVENTIL HFA;VENTOLIN HFA) 108 (90 Base) MCG/ACT inhaler Inhale 2 puffs into the lungs every 4 (four) hours as needed for wheezing or shortness of breath (cough, shortness of breath or wheezing.). 1 Inhaler 0  . carvedilol (COREG) 12.5 MG tablet Take 1 tablet (12.5 mg total) by mouth 2 (two) times daily. 180 tablet 3  . clopidogrel (PLAVIX) 75  MG tablet Take 1 tablet (75 mg total) by mouth daily. 90 tablet 3  . cyclobenzaprine (FLEXERIL) 5 MG tablet Take 1 tablet (5 mg total) by mouth 3 (three) times daily as needed. 90 tablet 5  . Dulaglutide (TRULICITY) 0.75 MG/0.5ML SOPN Inject 0.75 mg into the skin every 7 (seven) days.     . enalapril (VASOTEC) 10 MG tablet Take 1 tablet (10 mg total) by mouth 2 (two) times daily. 180 tablet 3  . furosemide (LASIX) 20 MG tablet Take 2 tablets (40 mg total) by mouth daily. 30 tablet 0  . glipiZIDE (GLUCOTROL XL) 10 MG 24 hr tablet Take 1 tablet (10 mg total) by mouth daily with breakfast. 90 tablet 1  . levothyroxine (SYNTHROID, LEVOTHROID)  75 MCG tablet Take 1 tablet (75 mcg total) by mouth daily. 90 tablet 3  . metFORMIN (GLUCOPHAGE) 1000 MG tablet Take 1 tablet (1,000 mg total) by mouth 2 (two) times daily with a meal. 180 tablet 3  . nicotine (NICODERM CQ - DOSED IN MG/24 HOURS) 14 mg/24hr patch Place 1 patch (14 mg total) onto the skin daily. 30 patch 0  . nitroGLYCERIN (NITROSTAT) 0.4 MG SL tablet Place 1 tablet (0.4 mg total) under the tongue every 5 (five) minutes as needed. (Patient taking differently: Place 0.4 mg under the tongue every 5 (five) minutes as needed for chest pain. ) 10 tablet 0  . simvastatin (ZOCOR) 40 MG tablet Take 1 tablet (40 mg total) by mouth at bedtime. 90 tablet 3   No current facility-administered medications for this visit.    Allergies:   Penicillins, Aspirin, Nsaids, and Latex    Social History:  The patient  reports that she has been smoking cigarettes. She has a 15.00 pack-year smoking history. She uses smokeless tobacco. She reports current alcohol use of about 2.0 standard drinks of alcohol per week. She reports that she does not use drugs.   Family History:  The patient's family history includes Arthritis in her brother; Cancer in her father; Colon cancer (age of onset: 47) in her maternal aunt; Dementia in her mother; Depression in her father; Diabetes in her mother and sister; Down syndrome in her son; Heart disease (age of onset: 65) in her brother; Heart disease (age of onset: 23) in her father; Heart disease (age of onset: 27) in her sister; Hypertension in her brother, father, mother, and sister; Multiple sclerosis in her sister; Prostate cancer in her father.    ROS: All other systems are reviewed and negative. Unless otherwise mentioned in H&P    PHYSICAL EXAM: VS:  BP (!) 148/72   Pulse (!) 107   Temp 97.7 F (36.5 C)   Ht 5\' 7"  (1.702 m)   Wt 197 lb (89.4 kg)   BMI 30.85 kg/m  , BMI Body mass index is 30.85 kg/m. GEN: Well nourished, well developed, in no acute  distress HEENT: normal Neck: no JVD, carotid bruits, or masses Cardiac:RRR; no murmurs, rubs, or gallops,no edema  Respiratory:  Clear to auscultation bilaterally, normal work of breathing GI: soft, nontender, nondistended, + BS MS: no deformity or atrophy Skin: warm and dry, no rash Neuro:  Strength and sensation are intact Psych: euthymic mood, full affect   EKG: Normal sinus rhythm with sinus arrhythmia, heart rate of 69 bpm.  (Personally reviewed)  Recent Labs: 01/02/2020: B Natriuretic Peptide 1,165.0 01/03/2020: ALT 8; BUN 12; Creatinine, Ser 0.92; Hemoglobin 10.0; Magnesium 1.8; Platelets 280; Potassium 3.6; Sodium 137; TSH 0.944  Lipid Panel    Component Value Date/Time   CHOL 156 01/19/2018 1106   TRIG 250 (H) 01/19/2018 1106   HDL 45 01/19/2018 1106   CHOLHDL 3.5 01/19/2018 1106   CHOLHDL 4.1 05/05/2016 0925   VLDL 64 (H) 05/05/2016 0925   LDLCALC 61 01/19/2018 1106      Wt Readings from Last 3 Encounters:  01/10/20 197 lb (89.4 kg)  02-01-2020 196 lb 6.9 oz (89.1 kg)  01/19/18 207 lb (93.9 kg)      Other studies Reviewed: Echocardiogram Feb 01, 2020 1. Left ventricular ejection fraction, by estimation, is 40 to 45%. The  left ventricle has mildly decreased function. The left ventricle  demonstrates regional wall motion abnormalities (see scoring  diagram/findings for description). There is moderate  left ventricular hypertrophy. Left ventricular diastolic parameters are  consistent with Grade II diastolic dysfunction (pseudonormalization).  Elevated left ventricular end-diastolic pressure. There is moderate  hypokinesis of the left ventricular,  mid-apical inferior wall, apical segment and anterior wall.  2. Right ventricular systolic function is mildly reduced. The right  ventricular size is normal. There is normal pulmonary artery systolic  pressure.  3. The mitral valve is abnormal. Mild mitral valve regurgitation.  4. The aortic valve is tricuspid.  Aortic valve regurgitation is not  visualized. Mild aortic valve sclerosis is present, with no evidence of  aortic valve stenosis.  5. The inferior vena cava is dilated in size with >50% respiratory  variability, suggesting right atrial pressure of 8 mmHg.    ASSESSMENT AND PLAN:  1.  CAD: History of stenting of the LAD in 2009.  Second diagonal with 99% stenosis and ramus of 30% lesion.  Follow-up stress test in 2017 noted EF of 50% with no evidence of ischemia.  The patient had recent hospitalization with acute CHF.  At that time EF was found to be decreased to 40 to 45%.  Discussion for cardiac catheterization was had with the patient.  She preferred to avoid this unless absolutely necessary.  I will instead schedule a Lexiscan Myoview to evaluate for new areas of ischemia and need to proceed with cardiac catheterization.  2.  Acute CHF: Reduced EF to 40 to 45% on recent hospitalization echocardiogram for 2021.  She is now on Lasix 40 mg daily without evidence of fluid retention or volume overload.  On recent labs potassium was 3.6.  Recommend potassium 4.0 for CAD patients.  Labs have been just recently drawn by PCP we do not have access to them at this time.  Will request.  Continue enalapril and Coreg as prescribed on discharge.  3.  Ongoing tobacco abuse: High risk for progression of CAD in the setting of continuation, along with other cardiovascular risk factors.  She is aware of the need to quit.  4.  Hyperlipidemia: Continues on simvastatin 40 mg daily at that time.  Goal of LDL less than 70.  I reviewed recent labs 01/19/2018 LDL was 61.  These will need to be repeated if not completed by PCP.  We will will await the records.  Current medicines are reviewed at length with the patient today.  I have spent 25 minutes dedicated to the care of this patient on the date of this encounter to include pre-visit review of records, assessment, management and diagnostic testing,with shared decision  making.  Labs/ tests ordered today include: Lexiscan Myoview  Bettey Mare. Liborio Nixon, ANP, AACC   01/10/2020 11:06 AM    [facility] [address] Office [phone] Fax [phone]  Notice: This dictation was prepared with Dragon dictation along with smaller phrase technology. Any transcriptional errors that result from this process are unintentional and may not be corrected upon review.

## 2020-01-10 ENCOUNTER — Encounter: Payer: Self-pay | Admitting: Adult Health

## 2020-01-10 ENCOUNTER — Ambulatory Visit (INDEPENDENT_AMBULATORY_CARE_PROVIDER_SITE_OTHER): Payer: Medicare HMO | Admitting: Adult Health

## 2020-01-10 ENCOUNTER — Encounter: Payer: Self-pay | Admitting: Internal Medicine

## 2020-01-10 ENCOUNTER — Other Ambulatory Visit: Payer: Self-pay

## 2020-01-10 VITALS — BP 148/72 | HR 107 | Temp 97.7°F | Ht 67.0 in | Wt 197.0 lb

## 2020-01-10 DIAGNOSIS — E7800 Pure hypercholesterolemia, unspecified: Secondary | ICD-10-CM

## 2020-01-10 DIAGNOSIS — I5022 Chronic systolic (congestive) heart failure: Secondary | ICD-10-CM

## 2020-01-10 DIAGNOSIS — I10 Essential (primary) hypertension: Secondary | ICD-10-CM | POA: Diagnosis not present

## 2020-01-10 DIAGNOSIS — I2510 Atherosclerotic heart disease of native coronary artery without angina pectoris: Secondary | ICD-10-CM | POA: Diagnosis not present

## 2020-01-10 DIAGNOSIS — F17200 Nicotine dependence, unspecified, uncomplicated: Secondary | ICD-10-CM | POA: Diagnosis not present

## 2020-01-10 DIAGNOSIS — E1159 Type 2 diabetes mellitus with other circulatory complications: Secondary | ICD-10-CM

## 2020-01-10 NOTE — Addendum Note (Signed)
Addended by: Jodelle Gross on: 01/10/2020 11:16 AM   Modules accepted: Level of Service

## 2020-01-10 NOTE — Patient Instructions (Signed)
Medication Instructions:  Continue current medications  *If you need a refill on your cardiac medications before your next appointment, please call your pharmacy*   Lab Work: None Ordered  Testing/Procedures: Your physician has requested that you have a lexiscan myoview. For further information please visit [email or web address] Please follow instruction sheet, as given.  Follow-Up: At [facility], you and your health needs are our priority.  As part of our continuing mission to provide you with exceptional heart care, we have created designated Provider Care Teams.  These Care Teams include your primary Cardiologist (physician) and Advanced Practice Providers (APPs -  Physician Assistants and Nurse Practitioners) who all work together to provide you with the care you need, when you need it.  We recommend signing up for the patient portal called "MyChart".  Sign up information is provided on this After Visit Summary.  MyChart is used to connect with patients for Virtual Visits (Telemedicine).  Patients are able to view lab/test results, encounter notes, upcoming appointments, etc.  Non-urgent messages can be sent to your provider as well.   To learn more about what you can do with MyChart, go to [email or web address].    Your next appointment:   3 month(s)  The format for your next appointment:   In Person  Provider:   You may see Dr Jacques Navy or one of the following Advanced Practice Providers on your designated Care Team:    Theodore Demark, PA-C  Joni Reining, DNP, ANP  Cadence Fransico Michael, NP

## 2020-01-31 ENCOUNTER — Telehealth (HOSPITAL_COMMUNITY): Payer: Self-pay

## 2020-01-31 NOTE — Telephone Encounter (Signed)
Encounter complete. 

## 2020-02-01 ENCOUNTER — Telehealth (HOSPITAL_COMMUNITY): Payer: Self-pay

## 2020-02-01 NOTE — Telephone Encounter (Signed)
Encounter complete. 

## 2020-02-02 ENCOUNTER — Ambulatory Visit (HOSPITAL_COMMUNITY)
Admission: RE | Admit: 2020-02-02 | Discharge: 2020-02-02 | Disposition: A | Discharge status: Home / Self Care | Payer: Medicare HMO | Source: Ambulatory Visit | Attending: Cardiology | Admitting: Cardiology

## 2020-02-02 ENCOUNTER — Other Ambulatory Visit: Payer: Self-pay

## 2020-02-02 DIAGNOSIS — I2510 Atherosclerotic heart disease of native coronary artery without angina pectoris: Secondary | ICD-10-CM

## 2020-02-02 LAB — MYOCARDIAL PERFUSION IMAGING
LV dias vol: 133 mL (ref 46–106)
LV sys vol: 74 mL
Peak HR: 107 {beats}/min
Rest HR: 90 {beats}/min
SDS: 4
SRS: 11
SSS: 15
TID: 1.05

## 2020-02-02 MED ORDER — TECHNETIUM TC 99M TETROFOSMIN IV KIT
10.0000 | PACK | Freq: Once | INTRAVENOUS | Status: AC | PRN
  Administered 2020-02-02: 10 via INTRAVENOUS
  Filled 2020-02-02: qty 10

## 2020-02-02 MED ORDER — TECHNETIUM TC 99M TETROFOSMIN IV KIT
31.8000 | PACK | Freq: Once | INTRAVENOUS | Status: AC | PRN
  Administered 2020-02-02: 31.8 via INTRAVENOUS
  Filled 2020-02-02: qty 32

## 2020-02-02 MED ORDER — REGADENOSON 0.4 MG/5ML IV SOLN
0.4000 mg | Freq: Once | INTRAVENOUS | Status: AC
  Administered 2020-02-02: 0.4 mg via INTRAVENOUS

## 2020-04-08 ENCOUNTER — Encounter: Payer: Self-pay | Admitting: General Practice

## 2021-02-06 ENCOUNTER — Other Ambulatory Visit: Payer: Self-pay

## 2021-02-06 ENCOUNTER — Emergency Department (HOSPITAL_COMMUNITY)
Admission: EM | Admit: 2021-02-06 | Discharge: 2021-02-07 | Disposition: A | Discharge status: Home / Self Care | Payer: Medicare HMO | Attending: Emergency Medicine | Admitting: Emergency Medicine

## 2021-02-06 ENCOUNTER — Emergency Department (HOSPITAL_COMMUNITY): Payer: Medicare HMO

## 2021-02-06 DIAGNOSIS — R7989 Other specified abnormal findings of blood chemistry: Secondary | ICD-10-CM | POA: Diagnosis not present

## 2021-02-06 DIAGNOSIS — I110 Hypertensive heart disease with heart failure: Secondary | ICD-10-CM | POA: Diagnosis not present

## 2021-02-06 DIAGNOSIS — Z91040 Latex allergy status: Secondary | ICD-10-CM | POA: Insufficient documentation

## 2021-02-06 DIAGNOSIS — I2510 Atherosclerotic heart disease of native coronary artery without angina pectoris: Secondary | ICD-10-CM | POA: Diagnosis not present

## 2021-02-06 DIAGNOSIS — E039 Hypothyroidism, unspecified: Secondary | ICD-10-CM | POA: Insufficient documentation

## 2021-02-06 DIAGNOSIS — Z794 Long term (current) use of insulin: Secondary | ICD-10-CM | POA: Diagnosis not present

## 2021-02-06 DIAGNOSIS — Z79899 Other long term (current) drug therapy: Secondary | ICD-10-CM | POA: Diagnosis not present

## 2021-02-06 DIAGNOSIS — R0602 Shortness of breath: Secondary | ICD-10-CM

## 2021-02-06 DIAGNOSIS — Z20822 Contact with and (suspected) exposure to covid-19: Secondary | ICD-10-CM | POA: Diagnosis not present

## 2021-02-06 DIAGNOSIS — D649 Anemia, unspecified: Secondary | ICD-10-CM

## 2021-02-06 DIAGNOSIS — E1159 Type 2 diabetes mellitus with other circulatory complications: Secondary | ICD-10-CM | POA: Diagnosis not present

## 2021-02-06 DIAGNOSIS — Z28310 Unvaccinated for covid-19: Secondary | ICD-10-CM | POA: Diagnosis not present

## 2021-02-06 DIAGNOSIS — I5023 Acute on chronic systolic (congestive) heart failure: Secondary | ICD-10-CM | POA: Insufficient documentation

## 2021-02-06 DIAGNOSIS — R059 Cough, unspecified: Secondary | ICD-10-CM | POA: Diagnosis not present

## 2021-02-06 DIAGNOSIS — Z7984 Long term (current) use of oral hypoglycemic drugs: Secondary | ICD-10-CM | POA: Diagnosis not present

## 2021-02-06 DIAGNOSIS — F17210 Nicotine dependence, cigarettes, uncomplicated: Secondary | ICD-10-CM | POA: Insufficient documentation

## 2021-02-06 LAB — CBC WITH DIFFERENTIAL/PLATELET
Abs Immature Granulocytes: 0.04 10*3/uL (ref 0.00–0.07)
Basophils Absolute: 0.1 10*3/uL (ref 0.0–0.1)
Basophils Relative: 1 %
Eosinophils Absolute: 0.1 10*3/uL (ref 0.0–0.5)
Eosinophils Relative: 1 %
HCT: 31.7 % — ABNORMAL LOW (ref 36.0–46.0)
Hemoglobin: 8.8 g/dL — ABNORMAL LOW (ref 12.0–15.0)
Immature Granulocytes: 1 %
Lymphocytes Relative: 34 %
Lymphs Abs: 2.9 10*3/uL (ref 0.7–4.0)
MCH: 22.1 pg — ABNORMAL LOW (ref 26.0–34.0)
MCHC: 27.8 g/dL — ABNORMAL LOW (ref 30.0–36.0)
MCV: 79.4 fL — ABNORMAL LOW (ref 80.0–100.0)
Monocytes Absolute: 0.8 10*3/uL (ref 0.1–1.0)
Monocytes Relative: 9 %
Neutro Abs: 4.6 10*3/uL (ref 1.7–7.7)
Neutrophils Relative %: 54 %
Platelets: 312 10*3/uL (ref 150–400)
RBC: 3.99 MIL/uL (ref 3.87–5.11)
RDW: 19.4 % — ABNORMAL HIGH (ref 11.5–15.5)
WBC: 8.5 10*3/uL (ref 4.0–10.5)
nRBC: 0.9 % — ABNORMAL HIGH (ref 0.0–0.2)

## 2021-02-06 LAB — BASIC METABOLIC PANEL
Anion gap: 11 (ref 5–15)
BUN: 8 mg/dL (ref 8–23)
CO2: 25 mmol/L (ref 22–32)
Calcium: 8.6 mg/dL — ABNORMAL LOW (ref 8.9–10.3)
Chloride: 97 mmol/L — ABNORMAL LOW (ref 98–111)
Creatinine, Ser: 0.72 mg/dL (ref 0.44–1.00)
GFR, Estimated: >60 mL/min (ref >60)
Glucose, Bld: 151 mg/dL — ABNORMAL HIGH (ref 70–99)
Potassium: 3.2 mmol/L — ABNORMAL LOW (ref 3.5–5.1)
Sodium: 133 mmol/L — ABNORMAL LOW (ref 135–145)

## 2021-02-06 LAB — TROPONIN I (HIGH SENSITIVITY)
Troponin I (High Sensitivity): 13 ng/L (ref <18)
Troponin I (High Sensitivity): 15 ng/L (ref <18)

## 2021-02-06 LAB — BRAIN NATRIURETIC PEPTIDE: B Natriuretic Peptide: 1412.1 pg/mL — ABNORMAL HIGH (ref 0.0–100.0)

## 2021-02-06 NOTE — ED Triage Notes (Signed)
Pt c/o shortness of breath and cough for past 6 months.  Pt states she has had some hallucinations recently. Pt denies any pain at this time. Pt had bronchitis and covid at the beginning of year and has had symptoms since then. States her legs are more swollen than usual.

## 2021-02-06 NOTE — ED Provider Notes (Signed)
Emergency Medicine Provider Triage Evaluation Note  Anna Noble , a 65 y.o. female  was evaluated in triage.  Pt complains of sob, cough has been going on intermittently for several months. Denies chest pain or fevers.   Review of Systems  Positive: Sob, cough Negative: Chest pain  Physical Exam  BP (!) 163/76 (BP Location: Right Arm)   Pulse (!) 101   Temp 98.5 F (36.9 C) (Oral)   Resp (!) 24   SpO2 99%  Gen:   Awake, no distress   Resp:  Normal effort  MSK:   Moves extremities without difficulty  Other:  Trace ble edema, lungs ctab  Medical Decision Making  Medically screening exam initiated at 7:00 PM.  Appropriate orders placed.  Anna Noble was informed that the remainder of the evaluation will be completed by another provider, this initial triage assessment does not replace that evaluation, and the importance of remaining in the ED until their evaluation is complete.    Anna Noble 02/06/21 1900    Anna Kaplan, MD 02/07/21 1530

## 2021-02-07 DIAGNOSIS — D649 Anemia, unspecified: Secondary | ICD-10-CM | POA: Diagnosis not present

## 2021-02-07 LAB — URINALYSIS, ROUTINE W REFLEX MICROSCOPIC
Bilirubin Urine: NEGATIVE
Glucose, UA: 50 mg/dL — AB
Hgb urine dipstick: NEGATIVE
Ketones, ur: NEGATIVE mg/dL
Leukocytes,Ua: NEGATIVE
Nitrite: NEGATIVE
Protein, ur: 30 mg/dL — AB
Specific Gravity, Urine: 1.011 (ref 1.005–1.030)
pH: 7 (ref 5.0–8.0)

## 2021-02-07 LAB — SARS CORONAVIRUS 2 (TAT 6-24 HRS): SARS Coronavirus 2: NEGATIVE

## 2021-02-07 MED ORDER — POTASSIUM CHLORIDE CRYS ER 20 MEQ PO TBCR
40.0000 meq | EXTENDED_RELEASE_TABLET | Freq: Once | ORAL | Status: AC
  Administered 2021-02-07: 40 meq via ORAL
  Filled 2021-02-07: qty 2

## 2021-02-07 MED ORDER — FUROSEMIDE 20 MG PO TABS
80.0000 mg | ORAL_TABLET | Freq: Once | ORAL | Status: AC
  Administered 2021-02-07: 80 mg via ORAL
  Filled 2021-02-07: qty 4

## 2021-02-07 NOTE — ED Notes (Signed)
Pt informed the techs that she would be stepping outside and would return.

## 2021-02-07 NOTE — Discharge Instructions (Addendum)
It appears that you have progressively developed anemia over the past several years. Is important to have your follow-up appointment with your primary care doctor and your cardiologist scheduled for the very near future.  You will need to have your blood counts rechecked to make sure that they are not going in the wrong direction.  May return to the ER for any new or concerning symptoms.  Or if your symptoms worsen.  Please take your home medications as prescribed apart from Flexeril which you should stop taking, and pramipexole which you should also stop taking  You can review your current medication list with your primary care provider once you have your follow-up appointment.  We will also be able to reassess your visual hallucinations.  Please start taking your Lasix again and take as prescribed.

## 2021-02-07 NOTE — ED Provider Notes (Signed)
[facility] EMERGENCY DEPARTMENT Provider Note   CSN: [id number] Arrival date & time: 02/06/21  1837     History Chief Complaint  Patient presents with  . Shortness of Breath    [name] is a 65 y.o. female.  HPI  Patient is a 65 year old female with past medical history significant for DM 2, currently smoking tobacco, HTN, MI, CAD, fibromyalgia, depression, HLD, CHF  Patient is presented today with cough and shortness of breath for the past 8 weeks she states that she feels that is progressively worsening.  Patient states that she does not feel she has had any significant weight changes but does not weigh herself regularly.  She states that she does not have any chest pain or pressure.  Denies any lightheadedness or dizziness no hemoptysis.  She denies any fevers or chills but was found to have a low-grade fever in triage.  She states she is vaccinated x2.  Has not received booster yet.  Against COVID-19.  She states that her shortness of breath seems to be primarily when she is exerting herself.  It seems that this is been an ongoing issue for her.  She does have a cardiologist which she states that she has not seen her cardiologist in quite some time. On my review of the EMR it appears that she sees Dr. Jacques Navy of cardiology  She states that she has not been taking her Lasix as prescribed for the past 6 weeks approximately.  It seems that there has been some issue with her insurance being for this medication however she states this is now resolved and she has a medication and took 1 dose yesterday.  She is taken no medications today. She states that this time she does not have any significant symptoms.  Is not feeling short of breath or lightheaded.  She also noted 2 episodes of visual hallucinations 1 episode of seeing a with dark snake in her room and 1 episode of seeing aliens in her backseat.    Past Medical History:  Diagnosis Date  . Acute myocardial  infarction of other anterior wall, episode of care unspecified   . Allergy   . Arthritis   . Coronary atherosclerosis of native coronary artery   . Depression   . DM (diabetes mellitus) (HCC)   . Dysmetabolic syndrome X   . HLD (hyperlipidemia)   . HTN (hypertension)   . Hypothyroidism   . Neuromuscular disorder (HCC)   . Obesity, unspecified   . Tobacco use disorder     Patient Active Problem List   Diagnosis Date Noted  . Type 2 diabetes mellitus with vascular disease (HCC) 01/03/2020  . Acute on chronic systolic (congestive) heart failure (HCC) 01/03/2020  . Acute CHF (congestive heart failure) (HCC) 01/02/2020  . Pure hypercholesterolemia 11/03/2016  . Thyroid activity decreased 03/20/2014  . Fibromyalgia 03/20/2014  . Depression 03/20/2014  . TOBACCO USER 02/21/2010  . Diabetes (HCC) 02/19/2010  . METABOLIC SYNDROME X 02/19/2010  . Essential hypertension, benign 02/19/2010  . MYOCARDIAL INFARCTION, ANTERIOR WALL 02/19/2010  . CAD, NATIVE VESSEL 02/19/2010    Past Surgical History:  Procedure Laterality Date  . ACHILLES TENDON SURGERY Left   . CARDIAC CATHETERIZATION    . Cardiac stenting    . EYE SURGERY    . HAND SURGERY Right    ganglion cyst, bone graft  . KNEE ARTHROSCOPY Right 1976  . STRABISMUS SURGERY Left 1959  . TOE SURGERY Right    x 3  .  TUBAL LIGATION       OB History   No obstetric history on file.     Family History  Problem Relation Age of Onset  . Dementia Mother   . Hypertension Mother   . Diabetes Mother   . Depression Father   . Heart disease Father 55       AMI age 56; CABG.  . Prostate cancer Father   . Hypertension Father   . Cancer Father        prostate cancer  . Hypertension Brother   . Arthritis Brother   . Heart disease Brother 35       AMI  . Diabetes Sister   . Heart disease Sister 39       mild AMI  . Hypertension Sister   . Multiple sclerosis Sister   . Down syndrome Son   . Colon cancer Maternal Aunt 31     Social History   Tobacco Use  . Smoking status: Current Every Day Smoker    Packs/day: 0.50    Years: 30.00    Pack years: 15.00    Types: Cigarettes  . Smokeless tobacco: Current User  Substance Use Topics  . Alcohol use: Yes    Alcohol/week: 2.0 standard drinks    Types: 2 Cans of beer per week    Comment: twice a week.  . Drug use: No    Home Medications Prior to Admission medications   Medication Sig Start Date End Date Taking? Authorizing Provider  albuterol (PROVENTIL HFA;VENTOLIN HFA) 108 (90 Base) MCG/ACT inhaler Inhale 2 puffs into the lungs every 4 (four) hours as needed for wheezing or shortness of breath (cough, shortness of breath or wheezing.). 11/15/17   Benjiman Core D, PA-C  carvedilol (COREG) 12.5 MG tablet Take 1 tablet (12.5 mg total) by mouth 2 (two) times daily. 06/03/17   Ethelda Chick, MD  clopidogrel (PLAVIX) 75 MG tablet Take 1 tablet (75 mg total) by mouth daily. 07/12/17   Ethelda Chick, MD  cyclobenzaprine (FLEXERIL) 5 MG tablet Take 1 tablet (5 mg total) by mouth 3 (three) times daily as needed. 01/19/18   Ethelda Chick, MD  Dulaglutide (TRULICITY) 0.75 MG/0.5ML SOPN Inject 0.75 mg into the skin every 7 (seven) days.     [provider]  enalapril (VASOTEC) 10 MG tablet Take 1 tablet (10 mg total) by mouth 2 (two) times daily. 06/03/17   Ethelda Chick, MD  furosemide (LASIX) 20 MG tablet Take 2 tablets (40 mg total) by mouth daily. 01/03/20 01/02/21  Rolly Salter, MD  glipiZIDE (GLUCOTROL XL) 10 MG 24 hr tablet Take 1 tablet (10 mg total) by mouth daily with breakfast. 01/19/18   Ethelda Chick, MD  levothyroxine (SYNTHROID, LEVOTHROID) 75 MCG tablet Take 1 tablet (75 mcg total) by mouth daily. 01/19/18   Ethelda Chick, MD  metFORMIN (GLUCOPHAGE) 1000 MG tablet Take 1 tablet (1,000 mg total) by mouth 2 (two) times daily with a meal. 06/03/17   Ethelda Chick, MD  nitroGLYCERIN (NITROSTAT) 0.4 MG SL tablet Place 1 tablet (0.4 mg total)  under the tongue every 5 (five) minutes as needed. Patient taking differently: Place 0.4 mg under the tongue every 5 (five) minutes as needed for chest pain.  05/24/17   Ofilia Neas, PA-C  simvastatin (ZOCOR) 40 MG tablet Take 1 tablet (40 mg total) by mouth at bedtime. 06/03/17   Ethelda Chick, MD    Allergies  Penicillins, Aspirin, Nsaids, and Latex  Review of Systems   Review of Systems  Constitutional: Negative for chills and fever (did not feel she had a fever).  HENT: Negative for congestion.   Eyes: Negative for pain.  Respiratory: Positive for cough and shortness of breath.   Cardiovascular: Negative for chest pain and leg swelling.  Gastrointestinal: Negative for abdominal pain, diarrhea, nausea and vomiting.  Genitourinary: Negative for dysuria.  Musculoskeletal: Negative for myalgias.  Skin: Negative for rash.  Neurological: Negative for dizziness and headaches.    Physical Exam Updated Vital Signs BP (!) 105/56 (BP Location: Left Arm)   Pulse (!) 105   Temp 99.7 F (37.6 C) (Oral)   Resp 18   SpO2 96%   Physical Exam Vitals and nursing note reviewed.  Constitutional:      General: She is not in acute distress.    Comments: Pleasant well-appearing 65 year old.  In no acute distress.  Sitting comfortably in bed.  Able answer questions appropriately follow commands. No increased work of breathing. Speaking in full sentences.   HENT:     Head: Normocephalic and atraumatic.     Nose: Nose normal.     Mouth/Throat:     Mouth: Mucous membranes are moist.  Eyes:     General: No scleral icterus. Cardiovascular:     Rate and Rhythm: Normal rate and regular rhythm.     Pulses: Normal pulses.     Heart sounds: Normal heart sounds.     Comments: HR 94 Pulmonary:     Effort: Pulmonary effort is normal. No respiratory distress.     Breath sounds: No wheezing.  Abdominal:     Palpations: Abdomen is soft.     Tenderness: There is no abdominal tenderness. There is  no right CVA tenderness, left CVA tenderness, guarding or rebound.  Musculoskeletal:     Cervical back: Normal range of motion.     Right lower leg: No edema.     Left lower leg: No edema.     Comments: No significant lower extremity edema  Skin:    General: Skin is warm and dry.     Capillary Refill: Capillary refill takes less than 2 seconds.  Neurological:     Mental Status: She is alert. Mental status is at baseline.     Comments: AAOx3  Psychiatric:        Mood and Affect: Mood normal.        Behavior: Behavior normal.     Comments: Good insight, able answer questions properly follow commands, pleasant     ED Results / Procedures / Treatments   Labs (all labs ordered are listed, but only abnormal results are displayed) Labs Reviewed  BRAIN NATRIURETIC PEPTIDE - Abnormal; Notable for the following components:      Result Value   B Natriuretic Peptide 1,412.1 (*)    All other components within normal limits  BASIC METABOLIC PANEL - Abnormal; Notable for the following components:   Sodium 133 (*)    Potassium 3.2 (*)    Chloride 97 (*)    Glucose, Bld 151 (*)    Calcium 8.6 (*)    All other components within normal limits  CBC WITH DIFFERENTIAL/PLATELET - Abnormal; Notable for the following components:   Hemoglobin 8.8 (*)    HCT 31.7 (*)    MCV 79.4 (*)    MCH 22.1 (*)    MCHC 27.8 (*)    RDW 19.4 (*)    nRBC 0.9 (*)  All other components within normal limits  URINALYSIS, ROUTINE W REFLEX MICROSCOPIC - Abnormal; Notable for the following components:   Glucose, UA 50 (*)    Protein, ur 30 (*)    Bacteria, UA RARE (*)    All other components within normal limits  SARS CORONAVIRUS 2 (TAT 6-24 HRS)  TROPONIN I (HIGH SENSITIVITY)  TROPONIN I (HIGH SENSITIVITY)    EKG EKG Interpretation  Date/Time:  Thursday Feb 06 2021 18:49:31 EDT Ventricular Rate:  101 PR Interval:  152 QRS Duration: 70 QT Interval:  350 QTC Calculation: 453 R Axis:   117 Text  Interpretation: Sinus tachycardia Septal infarct , age undetermined Lateral infarct , age undetermined t-waves moreflattened diffusely Confirmed by Gwyneth Sprout ([id number]) on 02/07/2021 8:58:47 AM   Radiology DG Chest 2 View  Result Date: 02/06/2021 CLINICAL DATA:  Shortness of breath. EXAM: CHEST - 2 VIEW COMPARISON:  January 02, 2020 FINDINGS: Cardiomegaly. Increased interstitial markings in the lungs. The hila, mediastinum, lungs, and pleura are otherwise unremarkable. IMPRESSION: Cardiomegaly and mild edema. Electronically Signed   By: Gerome Sam III M.D   On: 02/06/2021 19:38    Procedures Procedures   Medications Ordered in ED Medications  furosemide (LASIX) tablet 80 mg (80 mg Oral Given 02/07/21 0953)  potassium chloride SA (KLOR-CON) CR tablet 40 mEq (40 mEq Oral Given 02/07/21 6045)    ED Course  I have reviewed the triage vital signs and the nursing notes.  Pertinent labs & imaging results that were available during my care of the patient were reviewed by me and considered in my medical decision making (see chart for details).    MDM Rules/Calculators/A&P                          Patient is a 65 year old female with past medical history significant for shortness of breath, cough her symptoms have been ongoing for several weeks.  She is well-appearing on physical exam.  Also initially noted to be tachycardic in triage she is not tachycardic on my examination with heart rate in mid 90s.  Physical exam is unremarkable.  Lung exam is unremarkable.  Patient denies any symptoms presently.  Patient does not appear to be acutely fluid overloaded.  She was noted to have a fever although on my reassessment patient has another fever and she denies any known fever.  She is vaccinated for COVID.  Will check COVID here in ER.  BMP with mild hypokalemia.  Patient is a CAD patient will replete with 40 mEq.  Troponin x2 within normal limits.  CBC without leukocytosis however there is mild  anemia present.  Is dropped by approximately 1 g over the past year.  She denies any dark or tarry stools.  She will follow-up with her PCP for recheck.  BNP elevated at 1412.  She is not significantly fluid overloaded on examination today.  Will provide with 80 mg of Lasix.  I suspect that her symptoms are due to mild fluid overload in setting of medication noncompliance.  Given PO lasix 80 mg and PO K+   Patient has had 3 episodes of urinating so far.  Her urine does have some protein and but no evidence of infection.  It is nitrate and leukocyte negative.  She is having some hallucinations with 2 episodes over the past week.  Her symptoms may be related to her mirtazapine, pramipexole or possibly related to her Ms. using Flexeril but taking 3 tablets once  daily instead of 3 tablets per day.  Will recommend that she stop these medications follow-up with PCP return to the ER for any new or concerning symptoms and drink plenty of water.  She has no HI, SI.  Zola Buttononi L Yohe was evaluated in Emergency Department on 02/07/2021 for the symptoms described in the history of present illness. She was evaluated in the context of the global COVID-19 pandemic, which necessitated consideration that the patient might be at risk for infection with the SARS-CoV-2 virus that causes COVID-19. Institutional protocols and algorithms that pertain to the evaluation of patients at risk for COVID-19 are in a state of rapid change based on information released by regulatory bodies including the CDC and federal and state organizations. These policies and algorithms were followed during the patient's care in the ED.   Final Clinical Impression(s) / ED Diagnoses Final diagnoses:  Shortness of breath  Elevated brain natriuretic peptide (BNP) level  Anemia, unspecified type    Rx / DC Orders ED Discharge Orders    None       Gailen ShelterFondaw, Vanshika Jastrzebski S, [place] 02/07/21 1330    Gwyneth SproutPlunkett, Whitney, MD 02/19/21 [phone]

## 2021-02-07 NOTE — ED Notes (Signed)
Patient's SpO2 94% while ambulating on room air

## 2021-03-07 ENCOUNTER — Other Ambulatory Visit: Payer: Self-pay | Admitting: Internal Medicine

## 2021-03-07 DIAGNOSIS — Z1231 Encounter for screening mammogram for malignant neoplasm of breast: Secondary | ICD-10-CM

## 2021-05-23 IMAGING — DX DG CHEST 2V
2 series · 2 of 2 positions shown · non-contrast
Comparison: January 02, 2020

CLINICAL DATA: Shortness of breath.

EXAM:
CHEST - 2 VIEW

[w chest pa]
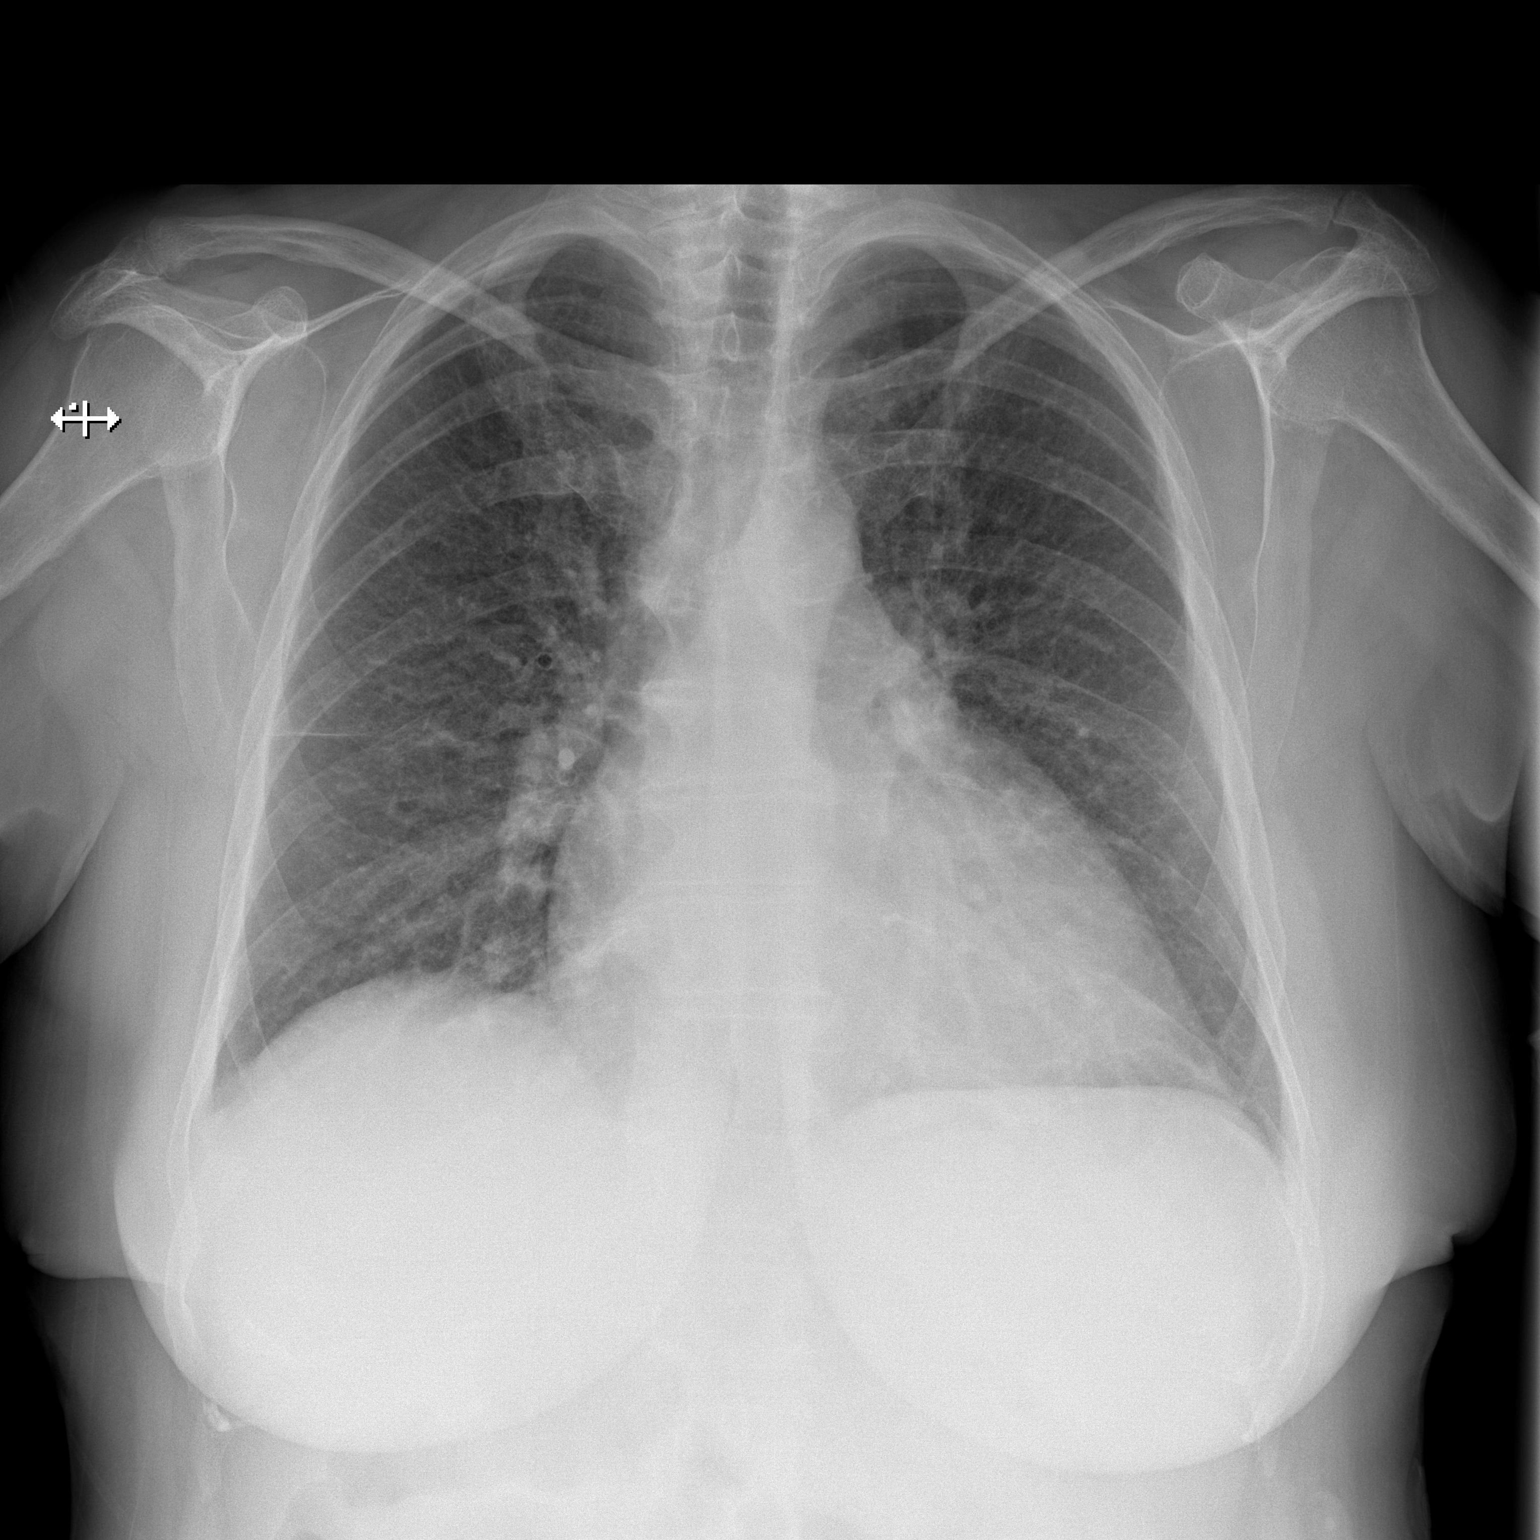

[w chest lat]
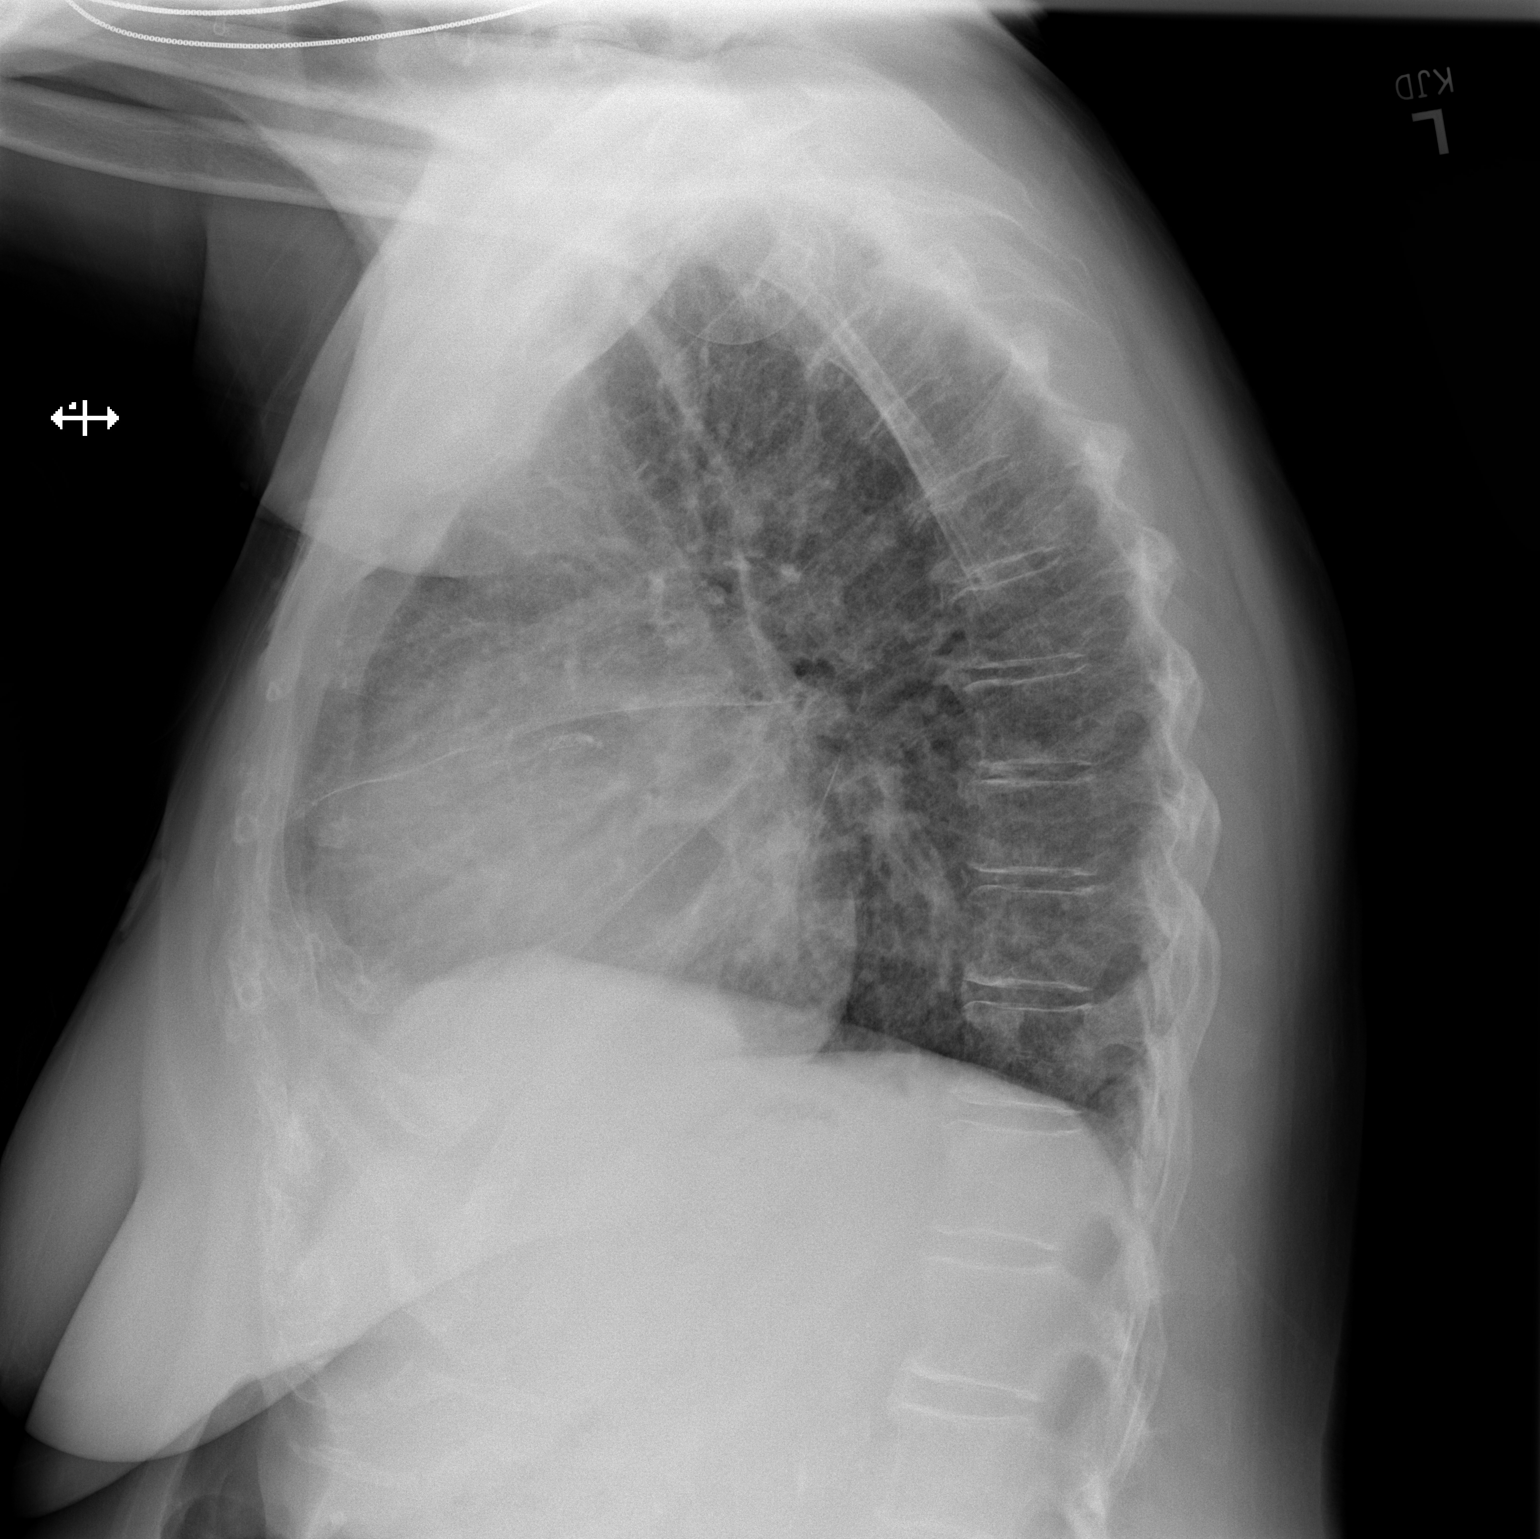

[2 of 2 positions shown; findings below may reference images not displayed]

FINDINGS: Cardiomegaly. Increased interstitial markings in the lungs. The
hila, mediastinum, lungs, and pleura are otherwise unremarkable.
IMPRESSION: Cardiomegaly and mild edema.

## 2021-06-05 ENCOUNTER — Other Ambulatory Visit: Payer: Self-pay

## 2021-06-05 ENCOUNTER — Ambulatory Visit
Admission: RE | Admit: 2021-06-05 | Discharge: 2021-06-05 | Disposition: A | Discharge status: Home / Self Care | Payer: Medicaid Other | Source: Ambulatory Visit | Attending: Family Medicine | Admitting: Family Medicine

## 2021-06-05 DIAGNOSIS — Z1231 Encounter for screening mammogram for malignant neoplasm of breast: Secondary | ICD-10-CM

## 2021-07-29 ENCOUNTER — Other Ambulatory Visit (HOSPITAL_COMMUNITY)
Admission: RE | Admit: 2021-07-29 | Discharge: 2021-07-29 | Disposition: A | Discharge status: Home / Self Care | Payer: Medicare HMO | Source: Ambulatory Visit | Attending: Obstetrics & Gynecology | Admitting: Obstetrics & Gynecology

## 2021-07-29 ENCOUNTER — Ambulatory Visit (INDEPENDENT_AMBULATORY_CARE_PROVIDER_SITE_OTHER): Payer: Medicare HMO | Admitting: Obstetrics & Gynecology

## 2021-07-29 ENCOUNTER — Other Ambulatory Visit: Payer: Self-pay

## 2021-07-29 ENCOUNTER — Encounter: Payer: Self-pay | Admitting: Obstetrics & Gynecology

## 2021-07-29 VITALS — BP 158/80 | HR 88 | Resp 16 | Wt 186.0 lb

## 2021-07-29 DIAGNOSIS — R87610 Atypical squamous cells of undetermined significance on cytologic smear of cervix (ASC-US): Secondary | ICD-10-CM | POA: Insufficient documentation

## 2021-07-29 DIAGNOSIS — R87810 Cervical high risk human papillomavirus (HPV) DNA test positive: Secondary | ICD-10-CM | POA: Insufficient documentation

## 2021-07-29 DIAGNOSIS — A630 Anogenital (venereal) warts: Secondary | ICD-10-CM

## 2021-07-29 NOTE — Progress Notes (Signed)
    Anna Noble Jan 11, 1956 [id number]        65 y.o.  [id number]   RP: ASCUS/HPV HR 18-18-45 Pos for Colpo  HPI: Patient reports having vulvar lesions frozen about 2 weeks ago.  H/O boyfriend who cheated on her 6 yrs ago.  Last Pap was about 6 yrs ago, then the recent one with ASCUS/HVP 16-18-45 pos.  No abnormal Pap before. Abstinent x last 6 yrs.   OB History  Gravida Para Term Preterm AB Living  $Remov'3 2 2   1 2  'zefYMH$ SAB IAB Ectopic Multiple Live Births               # Outcome Date GA Lbr Len/2nd Weight Sex Delivery Anes PTL Lv  3 AB           2 Term           1 Term             Past medical history,surgical history, problem list, medications, allergies, family history and social history were all reviewed and documented in the EPIC chart.   Directed ROS with pertinent positives and negatives documented in the history of present illness/assessment and plan.  Exam:  Vitals:   07/29/21 1552  BP: (!) 158/80  Pulse: 88  Resp: 16  Weight: 186 lb (84.4 kg)   General appearance:  Normal  Colposcopy Procedure Note Anna Noble 07/29/2021  Indications: ASCUS/HPV HR Pos  Procedure Details  The risks and benefits of the procedure and Written informed consent obtained.  Speculum placed in vagina and excellent visualization of cervix achieved, cervix swabbed x 3 with acetic acid solution.  Findings:  Cervix colposcopy:  Physical Exam Genitourinary:       Vaginal colposcopy:  Normal  Vulvar colposcopy: 2 warty raised lesions at posterior vulva about 1.5 cm each Rt and Lt.  Perirectal colposcopy: Normal with hemorrhoids  The cervix was sprayed with Hurricane before performing the cervical biopsies.  Specimens: Cervical Bxs at 7 O'Clock and 12 O'Clock  Complications: No Cx.  Good hemostasis with Silver Nitrate . Plan: Management per Cervical Bx results.  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 [id number]   1. 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  Counseling on abnormal Pap test and high-risk HPV done.  Colposcopy procedure explained.  Colposcopy findings reviewed with patient.  Management per cervical biopsy results.  Postprocedure precautions and expectations reviewed. - Surgical pathology( Anna Noble/ Anna Noble)  2. Condyloma F/U Cryotherapy of vulvar condylomas  Other orders - Blood Glucose Monitoring Suppl (ACCU-CHEK AVIVA PLUS) w/Device KIT; Use 1 kit as directed once a day  to check fasting blood sugar for diabetes - ACCU-CHEK AVIVA PLUS test strip; SMARTSIG:1 Strip(s) Via Meter Every Morning - Accu-Chek Softclix Lancets lancets; SMARTSIG:Topical - Magnesium Oxide 400 MG CAPS; Take 1 capsule by mouth daily. - omeprazole (PRILOSEC) 20 MG capsule; Take 20 mg by mouth daily. - furosemide (LASIX) 40 MG tablet; Take by mouth 2 (two) times daily.   [name] MD, 4:20 PM 07/29/2021

## 2021-07-31 ENCOUNTER — Encounter: Payer: Self-pay | Admitting: Obstetrics & Gynecology

## 2021-07-31 LAB — SURGICAL PATHOLOGY

## 2021-08-01 ENCOUNTER — Telehealth: Payer: Self-pay | Admitting: *Deleted

## 2021-08-01 NOTE — Telephone Encounter (Signed)
-----   Message from Genia Del, MD sent at 07/31/2021 10:34 PM EDT ----- Cervical Bxs CIN 1.  Repeat Pap in 6 months.

## 2021-08-01 NOTE — Telephone Encounter (Signed)
Patient informed with results, states you mentioned to her possible HPV vaccine based on results? Please advise

## 2021-08-06 NOTE — Telephone Encounter (Signed)
Patient aware of message. Patient will call her insurance company to verify coverage benefits, than will call us back to schedule injection appointment for Gardasil

## 2021-08-06 NOTE — Telephone Encounter (Signed)
Left message for pt to return our call. 

## 2021-08-06 NOTE — Telephone Encounter (Signed)
Genia Del, MD  Ted Mcalpine L, RMA 17 minutes ago (11:07 AM)   Yes, can have Gardasil x 3.  Not sure if insurance will cover.

## 2021-08-07 ENCOUNTER — Ambulatory Visit: Payer: Medicare HMO

## 2021-09-19 IMAGING — MG MM DIGITAL SCREENING BILAT W/ TOMO AND CAD
8 series · 8 of 24 positions shown · non-contrast
Comparison: Previous exam(s).

CLINICAL DATA: Screening.

EXAM:
DIGITAL SCREENING BILATERAL MAMMOGRAM WITH TOMOSYNTHESIS AND CAD
TECHNIQUE: Bilateral screening digital craniocaudal and mediolateral oblique
mammograms were obtained. Bilateral screening digital breast
tomosynthesis was performed. The images were evaluated with
computer-aided detection.

[R MLO synth-2D]
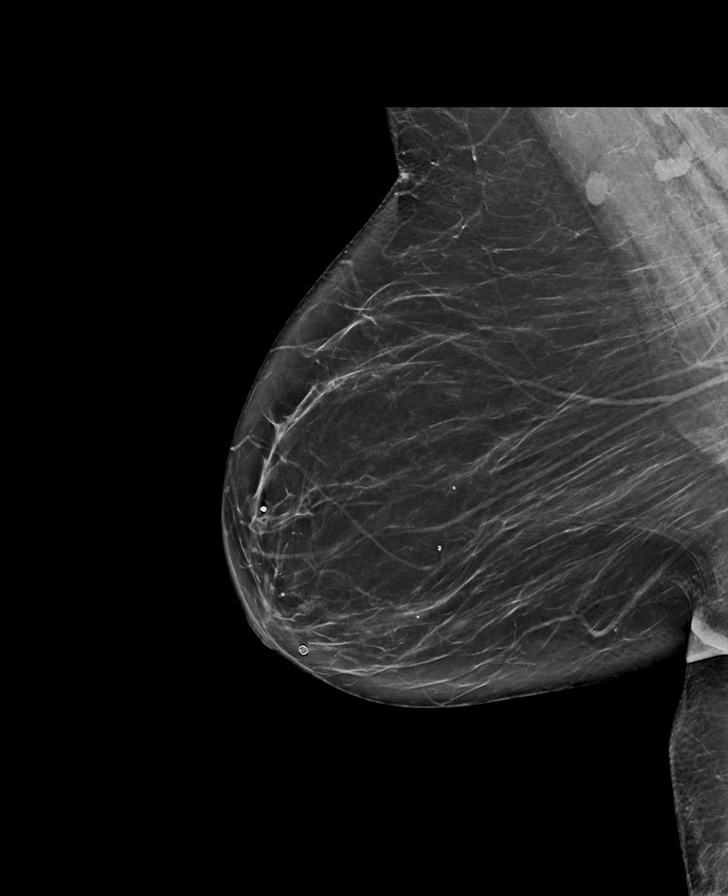

[L MLO synth-2D]
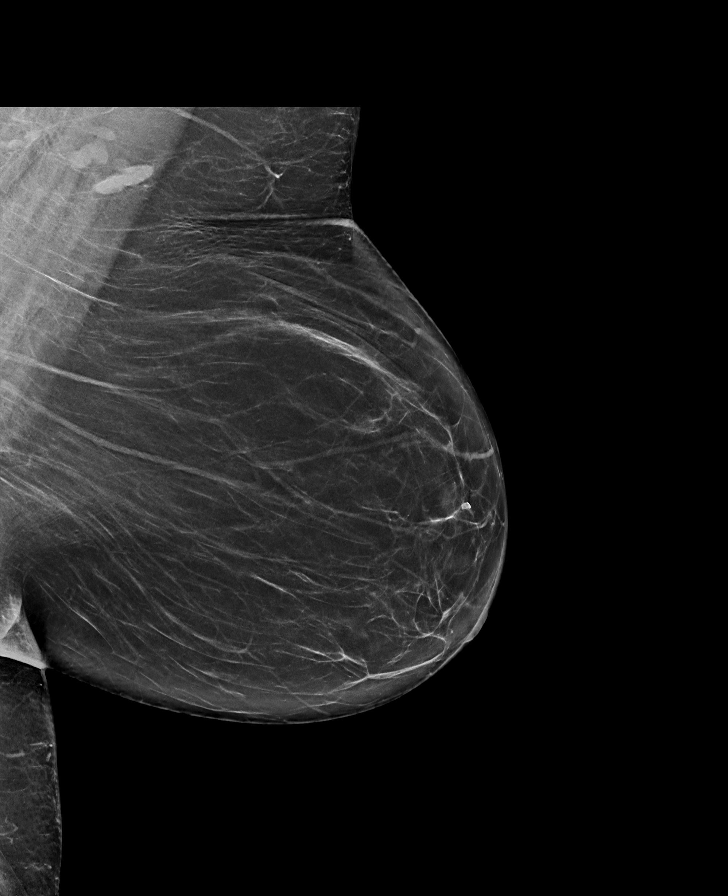

[L CC synth-2D]
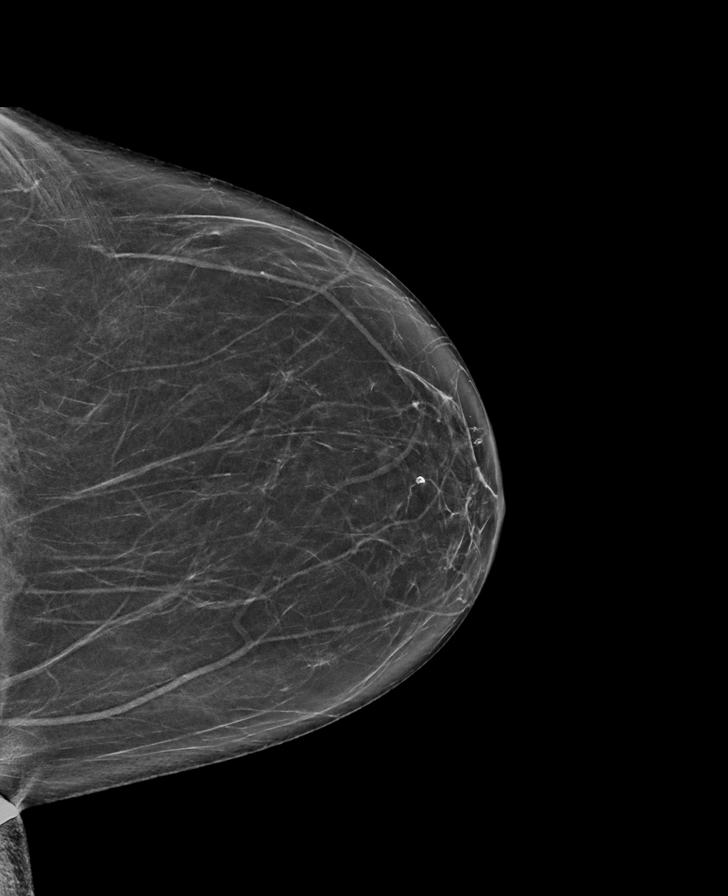

[R CC synth-2D]
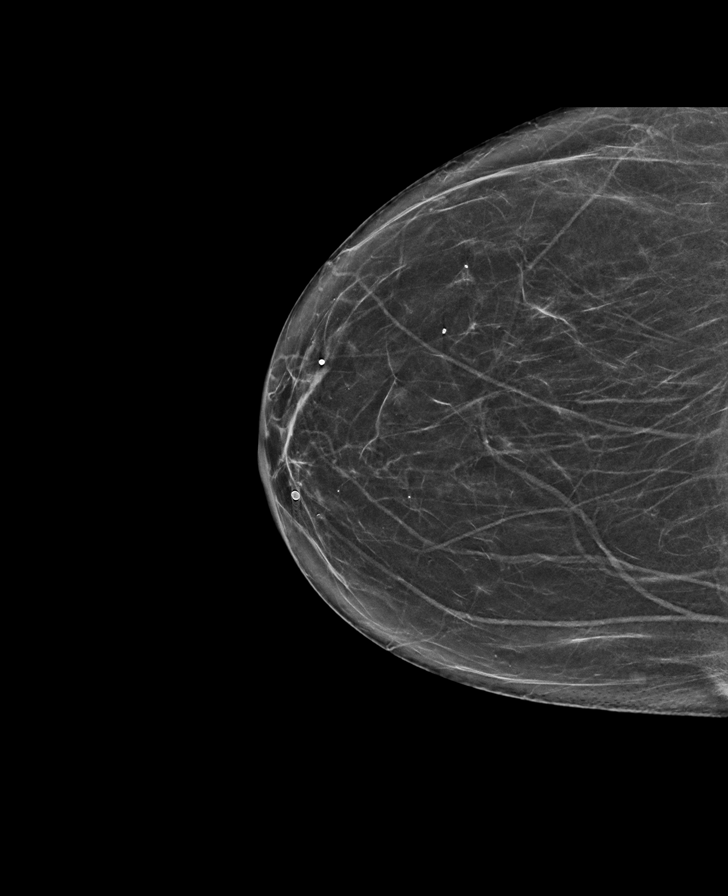

[R MLO tomo · tomo slice 39/78.0]
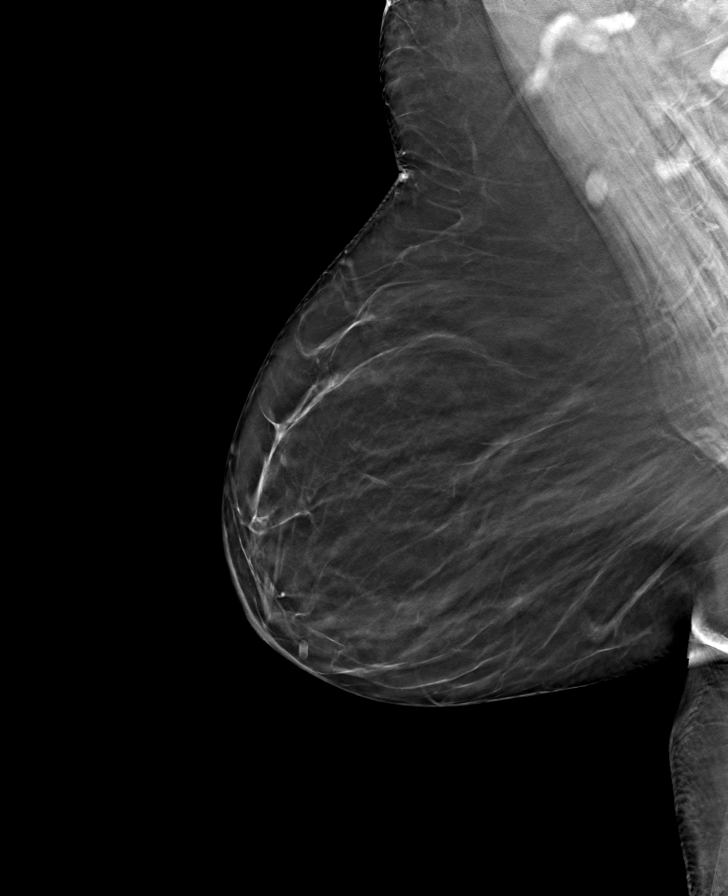

[R CC tomo · tomo slice 33/65.0]
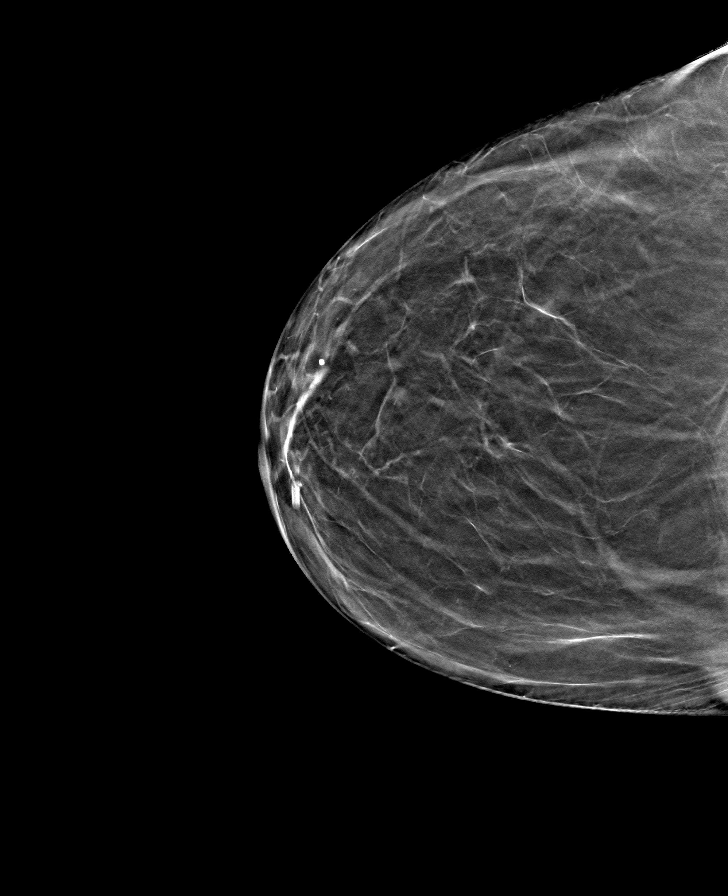

[L CC tomo · tomo slice 34/67.0]
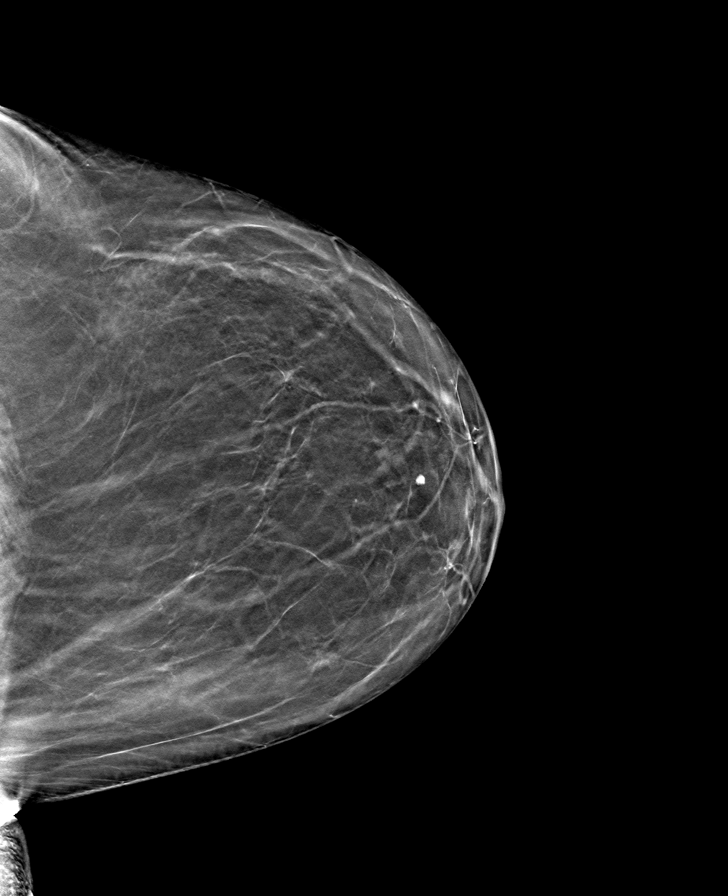

[L MLO tomo · tomo slice 41/80.0]
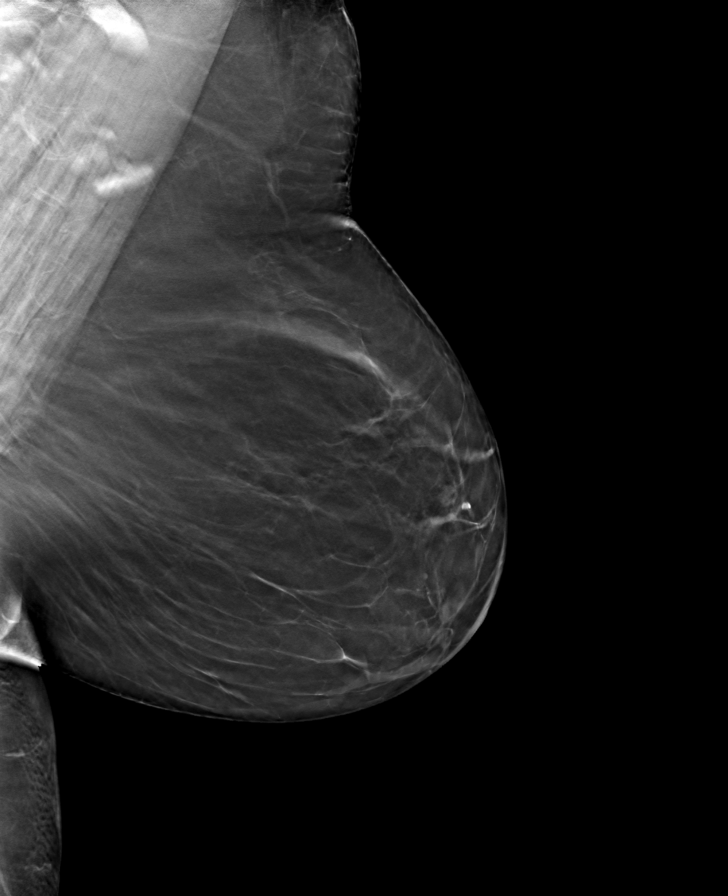

[8 of 24 positions shown; findings below may reference images not displayed]

ACR Breast Density Category b: There are scattered areas of
fibroglandular density.
FINDINGS: There are no findings suspicious for malignancy.
IMPRESSION: No mammographic evidence of malignancy. A result letter of this
screening mammogram will be mailed directly to the patient.

RECOMMENDATION:
Screening mammogram in one year. (Code:51-O-LD2)

BI-RADS CATEGORY  1: Negative.

## 2024-02-18 ENCOUNTER — Other Ambulatory Visit (HOSPITAL_COMMUNITY): Payer: Self-pay | Admitting: Internal Medicine

## 2024-02-18 DIAGNOSIS — I253 Aneurysm of heart: Secondary | ICD-10-CM

## 2024-04-10 ENCOUNTER — Other Ambulatory Visit (HOSPITAL_COMMUNITY): Payer: Self-pay | Admitting: *Deleted

## 2024-04-10 DIAGNOSIS — Z01810 Encounter for preprocedural cardiovascular examination: Secondary | ICD-10-CM

## 2024-04-10 DIAGNOSIS — R0609 Other forms of dyspnea: Secondary | ICD-10-CM

## 2024-04-10 DIAGNOSIS — I253 Aneurysm of heart: Secondary | ICD-10-CM

## 2024-04-11 ENCOUNTER — Telehealth (HOSPITAL_COMMUNITY): Payer: Self-pay | Admitting: Emergency Medicine

## 2024-04-11 NOTE — Telephone Encounter (Signed)
Attempted to call patient regarding upcoming cardiac MR appointment. Left message on voicemail with name and callback number Roberta Angell RN Navigator Cardiac Imaging [facility] [phone] Office [phone] Cell  

## 2024-04-12 ENCOUNTER — Other Ambulatory Visit (HOSPITAL_COMMUNITY): Payer: Self-pay | Admitting: Internal Medicine

## 2024-04-12 ENCOUNTER — Inpatient Hospital Stay (HOSPITAL_COMMUNITY)
Admission: RE | Admit: 2024-04-12 | Discharge: 2024-04-12 | Discharge status: Home / Self Care | Source: Ambulatory Visit | Attending: Cardiology

## 2024-04-12 ENCOUNTER — Encounter: Payer: Self-pay | Admitting: Cardiology

## 2024-04-12 ENCOUNTER — Ambulatory Visit (HOSPITAL_COMMUNITY)
Admission: RE | Admit: 2024-04-12 | Discharge: 2024-04-12 | Disposition: A | Discharge status: Home / Self Care | Source: Ambulatory Visit | Attending: Internal Medicine | Admitting: Internal Medicine

## 2024-04-12 VITALS — BP 111/41 | HR 80

## 2024-04-12 DIAGNOSIS — I5041 Acute combined systolic (congestive) and diastolic (congestive) heart failure: Secondary | ICD-10-CM | POA: Insufficient documentation

## 2024-04-12 DIAGNOSIS — Z01810 Encounter for preprocedural cardiovascular examination: Secondary | ICD-10-CM

## 2024-04-12 DIAGNOSIS — I253 Aneurysm of heart: Secondary | ICD-10-CM

## 2024-04-12 DIAGNOSIS — R0609 Other forms of dyspnea: Secondary | ICD-10-CM | POA: Insufficient documentation

## 2024-04-12 LAB — CBC
HCT: 42.6 % (ref 36.0–46.0)
Hemoglobin: 14.2 g/dL (ref 12.0–15.0)
MCH: 30.5 pg (ref 26.0–34.0)
MCHC: 33.3 g/dL (ref 30.0–36.0)
MCV: 91.6 fL (ref 80.0–100.0)
Platelets: 232 K/uL (ref 150–400)
RBC: 4.65 MIL/uL (ref 3.87–5.11)
RDW: 12.9 % (ref 11.5–15.5)
WBC: 10.4 K/uL (ref 4.0–10.5)
nRBC: 0 % (ref 0.0–0.2)

## 2024-04-12 MED ORDER — ALBUTEROL SULFATE HFA 108 (90 BASE) MCG/ACT IN AERS
INHALATION_SPRAY | RESPIRATORY_TRACT | Status: AC
  Filled 2024-04-12: qty 6.7

## 2024-04-12 MED ORDER — GADOBUTROL 1 MMOL/ML IV SOLN
9.0000 mL | Freq: Once | INTRAVENOUS | Status: AC | PRN
  Administered 2024-04-12: 9 mL via INTRAVENOUS

## 2024-04-12 MED ORDER — REGADENOSON 0.4 MG/5ML IV SOLN
0.4000 mg | Freq: Once | INTRAVENOUS | Status: AC
  Administered 2024-04-12: 0.4 mg via INTRAVENOUS
  Filled 2024-04-12: qty 5

## 2024-04-12 MED ORDER — METOPROLOL TARTRATE 5 MG/5ML IV SOLN
INTRAVENOUS | Status: AC
  Filled 2024-04-12: qty 5

## 2024-04-12 MED ORDER — REGADENOSON 0.4 MG/5ML IV SOLN
INTRAVENOUS | Status: AC
Start: 2024-04-12 — End: 2024-04-12
  Filled 2024-04-12: qty 5

## 2024-04-12 MED ORDER — AMINOPHYLLINE 25 MG/ML IV SOLN
INTRAVENOUS | Status: AC
  Filled 2024-04-12: qty 10

## 2024-04-12 MED ORDER — NITROGLYCERIN 0.4 MG SL SUBL
SUBLINGUAL_TABLET | SUBLINGUAL | Status: AC
  Filled 2024-04-12: qty 1

## 2024-04-12 NOTE — Progress Notes (Signed)
 Patient presents for stress MRI.  BP 102/58, HR 79.  No caffeine intake in prior 12 hours. No wheezing on exam.  EKG today shows NSR, rate 79   Shared Decision Making/Informed Consent The risks [chest pain, shortness of breath, cardiac arrhythmias, dizziness, blood pressure fluctuations, myocardial infarction, stroke/transient ischemic attack, nausea, vomiting, allergic reaction, and life-threatening complications (estimated to be 1 in 10,000)], benefits (risk stratification, diagnosing coronary artery disease, treatment guidance) and alternatives of a MRI stress test were discussed in detail with patient and they agree to proceed.

## 2024-04-12 NOTE — Progress Notes (Signed)
 Patient stated that she had SOB that has now resolved three minutes after regadenoson  administration.  Dr. Kate made aware.

## 2024-04-27 ENCOUNTER — Other Ambulatory Visit (HOSPITAL_COMMUNITY)

## 2024-05-12 ENCOUNTER — Telehealth: Payer: Self-pay | Admitting: Cardiology

## 2024-05-12 NOTE — Telephone Encounter (Signed)
 Patient called to get stent procedure scheduled.

## 2024-05-12 NOTE — Telephone Encounter (Signed)
 Spoke to patient she stated she was told 2 weeks ago by her PCP Dr.Cahill at [facility] someone from [facility] hospital would be calling her back to schedule a angiogram.She has never received a call.Advised she will need to call PCP.She will need to schedule appointment with cardiologist first.Her last appointment here was with Lamarr Satterfield DNP 01/10/20.Stated she will call PCP before she schedules appointment.

## 2024-08-07 ENCOUNTER — Encounter: Payer: Self-pay | Admitting: Cardiology

## 2024-08-07 ENCOUNTER — Ambulatory Visit: Attending: Cardiology | Admitting: Cardiology

## 2024-08-07 VITALS — BP 136/82 | HR 75 | Ht 67.0 in | Wt 183.0 lb

## 2024-08-07 DIAGNOSIS — R0609 Other forms of dyspnea: Secondary | ICD-10-CM | POA: Insufficient documentation

## 2024-08-07 DIAGNOSIS — I2109 ST elevation (STEMI) myocardial infarction involving other coronary artery of anterior wall: Secondary | ICD-10-CM

## 2024-08-07 DIAGNOSIS — R9430 Abnormal result of cardiovascular function study, unspecified: Secondary | ICD-10-CM | POA: Insufficient documentation

## 2024-08-07 DIAGNOSIS — I5022 Chronic systolic (congestive) heart failure: Secondary | ICD-10-CM

## 2024-08-07 DIAGNOSIS — E785 Hyperlipidemia, unspecified: Secondary | ICD-10-CM

## 2024-08-07 DIAGNOSIS — I25118 Atherosclerotic heart disease of native coronary artery with other forms of angina pectoris: Secondary | ICD-10-CM | POA: Diagnosis not present

## 2024-08-07 DIAGNOSIS — I10 Essential (primary) hypertension: Secondary | ICD-10-CM | POA: Diagnosis not present

## 2024-08-07 DIAGNOSIS — E1159 Type 2 diabetes mellitus with other circulatory complications: Secondary | ICD-10-CM

## 2024-08-07 NOTE — Assessment & Plan Note (Signed)
 Managed with Farxiga and Ozempic. Recent medication change from Trulicity to Ozempic due to availability issues. - Hold Farxiga and Ozempic the week before heart catheterization.

## 2024-08-07 NOTE — Assessment & Plan Note (Signed)
 BP well-controlled on adequate GDMT for ICM. - Continue Entresto 97-100 mg twice daily, bisoprolol-HCTZ 10-625 mg daily

## 2024-08-07 NOTE — Assessment & Plan Note (Signed)
 Myoview  suggests the known prior anterior infarct as well as apical infarct but there is also suggestion of possible inferior infarct with peri-infarct ischemia.  Recommendations cardiac catheterization.  Referred for catheterization by Dr. Jeronimo.  Will plan for cardiac catheterization on Monday, November 17.  See Informed Consent /Shared Decision Making statement below.  Preprocedural labs drawn.  Discussed holding Farxiga and Ozempic and continue Plavix .

## 2024-08-07 NOTE — Progress Notes (Signed)
 Cardiology Office Note:  .   Date:  08/07/2024  ID:  Anna Noble, DOB Jun 08, 1956, MRN [id number] PCP: Waddell Palma, PA-C  Cardiologist: Dr. Jeronimo GLENWOOD [facility] Providers Cardiologist:  Norleen CHRISTELLA Jeronimo, MD     Chief Complaint  Patient presents with   New Patient (Initial Visit)    Interventional consult for cardiac catheterization.   Coronary Artery Disease    History of anterior STEMI with apical aneurysm.  Now with stress MRI showing also inferior infarct with possible ischemia    Patient Profile: .     Anna Noble is a  68 y.o. divorced female former smoker (quit in January 2025) with a PMH notable for Anterior MI, single-vessel CAD with ischemic cardiomyopathy, HTN and HLD who presents here for Interventional Cardiology Consult for abnormal Cardiac Stress MRI at the request of Jeronimo Norleen CHRISTELLA, MD-cardiologist from [facility].  PMH: CAD (2001)-90% LAD and 99% D2-LAD PCI.  (Associated with shock-balloon pump) at [facility] (EF 40 to 45% with apical aneurysm.- HTN HLD DM-2 Hypothyroidism Depression CKD 3 AA     Anna Noble was last seen at [facility] by Lamarr Satterfield, NP on January 10, 2020 (prior to this she had been followed by Dr. Lavona and then seen in consultation in the hospital by Dr. Loni).  She ordered a Myoview  stress test as opposed to proceeding with cardiac catheterization.  Subjective  Discussed the use of AI scribe software for clinical note transcription with the patient, who gave verbal consent to proceed.  History of Present Illness Anna Noble is a 68 year old female with coronary artery disease and prior myocardial infarction who presents with shortness of breath. She was referred by Dr. Onesimo for evaluation of coronary artery disease and consideration of heart catheterization.  She experiences shortness of breath, particularly when ascending stairs. No chest pain, tightness, or pressure is noted, and she  does not experience orthopnea or paroxysmal nocturnal dyspnea.  She only notes the chest tightness when she overdoes it in the setting.  She also denies palpitations, dizziness, or syncope.  Her history includes coronary artery disease with a prior myocardial infarction in 2001, for which she received a stent. Recent imaging, including a CT and MRI, revealed a blockage and an aneurysm. The patient recalls being told the aneurysm was related to scar tissue from her prior myocardial infarction. An echocardiogram showed mild asymmetric septal hypertrophy, no outflow obstruction, and an ejection fraction of 50-55%. A stress test showed mid to distal anterior hypokinesis and panapical hypokinesis.  Her current medications include clopidogrel  75 mg daily, rosuvastatin 20 mg daily, Entresto 97/103 mg twice daily, bisoprolol HCTZ 10/6.25 mg once daily, Farxiga 10 mg daily, Ozempic 2 mg weekly, lansoprazole 30 mg daily, and potassium chloride  20 mg daily. She carries nitroglycerin  but has not used it recently.  She quit smoking a year ago and denies any history of diabetes requiring insulin . She is currently on Farxiga and Ozempic for diabetes management.     Objective   FH: Mother and father hypertension.  Mother had diabetes type 2 and father had hyperlipidemia  Medications - Clopidogrel  75 mg daily - Farxiga 10 mg daily - Ozempic 2 mg injection once a week - Entresto 97-103 mg twice a day - Bisoprolol-HCTZ 10-6.25 mg daily - Rosuvastatin 20 mg daily  - Lansoprazole 30 mg daily - Potassium chloride  20 mg daily - Albuterol - Budesonide inhaler as needed - Ipratropium nasal inhaler -  Levothyroxine  25 mcg daily - Duloxetine  60 mg daily - Gabapentin  600 mg nightly - Levo cetirizine 5 mg daily -Ergocalciferol /vitamin D  2 1250 mcg daily  Studies Reviewed: Anna Noble   EKG Interpretation Date/Time:  Monday August 07 2024 14:12:36 EST Ventricular Rate:  75 PR Interval:  194 QRS Duration:  90 QT  Interval:  356 QTC Calculation: 397 R Axis:   26  Text Interpretation: Normal sinus rhythm Low voltage QRS Cannot rule out Anteroseptal infarct (cited on or before 11-Jun-2000) When compared with ECG of 12-Apr-2024 09:11, No significant change was found Confirmed by Anner Lenis ([id number]) on 08/07/2024 11:30:11 PM    Results DIAGNOSTIC Echocardiogram: Mild asymmetric septal hypertrophy, no outflow obstruction, EF 50-55% (01/17/2024) Cardiac Stress MRI [facility]): Mild systolic dysfunction with asymmetrical hypertrophy basal septal (14 mm) versus posterior wall 8 mm).  Does not meet criteria for hypertrophic cardiomyopathy.  Stress perfusion defect in the basal to mid inferior wall worse in stress than at rest suggestive of prior infarct with peri-infarct ischemia.  Also mid to apical anterior stress perfusion defect corresponding to a large area of LGE consistent with anterior infarct.  The anterior defect suggests viability in the mid to apical anterior wall but likely transmural infarct in the apical wall.  LVEF estimated 48%. => Cardiac catheterization recommended. (04/12/2024)   --- Myoview  Stress Test : Moderate sized mid-distal-distal lateral, apical fixed defect consistent with prior infarct but no ischemia.  EF 45%.  (02/02/2020) Echocardiogram: EF 40 to 45%.  Moderate hypokinesis of mid-apical inferior wall, apical segment and anterior wall.  GR 2 DD.  Mildly reduced RV function.  Mild AV sclerosis with no stenosis.  RAP estimated 8 mmHg. --- Cardiac Cath: Severely decreased LV function secondary to extensive apical lateral infarction.  One-vessel CAD with 99% stenosis in mid LAD followed by a long segment of 50%. => 2.5 mm 13 mm BX velocity BMS deployed at high atmospheres in the mid LAD.  (06/11/2000)  Risk Assessment/Calculations:          Physical Exam:   VS:  BP 136/82 (BP Location: Right Arm, Patient Position: Sitting, Cuff Size: Normal)   Pulse 75   Ht 5' 7 (1.702  m)   Wt 183 lb (83 kg)   SpO2 97%   BMI 28.66 kg/m    Wt Readings from Last 3 Encounters:  08/07/24 183 lb (83 kg)  07/29/21 186 lb (84.4 kg)  02/02/20 197 lb (89.4 kg)     GEN: Well nourished, well groomed in no acute distress; relatively healthy appearing. NECK: No JVD; No carotid bruits CARDIAC: Normal S1, S2; RRR, no murmurs, rubs, gallops RESPIRATORY:  Clear to auscultation without rales, wheezing or rhonchi ; nonlabored, good air movement. ABDOMEN: Soft, non-tender, non-distended EXTREMITIES:  No edema; No deformity      ASSESSMENT AND PLAN: .    Problem List Items Addressed This Visit       Cardiology Problems   Chronic heart failure with mildly reduced ejection fraction (HFmrEF) (HCC) (Chronic)   NYHA class II symptoms now with exertional dyspnea.  Unsure if the symptom is angina related or CHF related.  She is not having any PND orthopnea.  Euvolemic on exam. She does not require diuretic-having previously been on furosemide  she is now only on the low dose of HCTZ associated bisoprolol-HCTZ.  As she had only been on carvedilol  12.5 mg twice daily and enalapril .  Was switched to bisoprolol and Entresto.  -She is on max dose Entresto 97-103  mg twice daily and bisoprolol-HCTZ 10-6.25 mg daily. - She is on Farxiga 10 mg daily and Ozempic 2 mg weekly - Not currently on spironolactone or furosemide .       Relevant Medications   bisoprolol-hydrochlorothiazide  (ZIAC) 10-6.25 MG tablet   sacubitril-valsartan (ENTRESTO) 97-103 MG   rosuvastatin (CRESTOR) 20 MG tablet   Other Relevant Orders   CBC (Completed)   Comprehensive metabolic panel with GFR (Completed)   Coronary artery disease involving native coronary artery with angina pectoris = manifested as exertional dyspnea (Chronic)   Atherosclerotic heart disease of native coronary artery with prior anterior wall myocardial infarction and chronic systolic heart failure Prior anterior wall myocardial infarction with chronic  systolic heart failure. Recent stress test indicated mid to distal anterior hypokinesis with apical hypokinesis, consistent with prior LAD infarct and aneurysm. Echocardiogram showed mild asymmetric septal hypertrophy, EF 50-55%, grade one diastolic dysfunction, mild MR, and moderate dilatation. Discussed heart catheterization risks and benefits for diagnostic clarity and potential intervention. - Scheduled heart catheterization for Monday November 17th, 2025. - Instructed to hold Farxiga and Ozempic the week before the procedure. - Continue clopidogrel . - Ordered chemistry panel and blood count prior to catheterization.      Relevant Medications   bisoprolol-hydrochlorothiazide  (ZIAC) 10-6.25 MG tablet   sacubitril-valsartan (ENTRESTO) 97-103 MG   rosuvastatin (CRESTOR) 20 MG tablet   Essential hypertension, benign (Chronic)   BP well-controlled on adequate GDMT for ICM. - Continue Entresto 97-100 mg twice daily, bisoprolol-HCTZ 10-625 mg daily      Relevant Medications   bisoprolol-hydrochlorothiazide  (ZIAC) 10-6.25 MG tablet   sacubitril-valsartan (ENTRESTO) 97-103 MG   rosuvastatin (CRESTOR) 20 MG tablet   Other Relevant Orders   EKG 12-Lead (Completed)   CBC (Completed)   Comprehensive metabolic panel with GFR (Completed)   History of ST elevation myocardial infarction (STEMI) of anterior wall (HCC) (Chronic)   Prior anterior infarct noted on echo and cardiac MRI but also now suggestion of possible inferior infarct with peri-infarct ischemia.  Question is this is a new lesion or is just simply reflecting the inferoapical infarct from the LAD.  Plan for cardiac catheterization to reassess.      Relevant Medications   bisoprolol-hydrochlorothiazide  (ZIAC) 10-6.25 MG tablet   sacubitril-valsartan (ENTRESTO) 97-103 MG   rosuvastatin (CRESTOR) 20 MG tablet   Other Relevant Orders   EKG 12-Lead (Completed)   CBC (Completed)   Comprehensive metabolic panel with GFR (Completed)    Hyperlipidemia with target low density lipoprotein (LDL) cholesterol less than 55 mg/dL (Chronic)   Managed with rosuvastatin.  Unsure of her lipid levels.  Managed by primary cardiologist. - Continue rosuvastatin 20 mg daily.      Relevant Medications   bisoprolol-hydrochlorothiazide  (ZIAC) 10-6.25 MG tablet   sacubitril-valsartan (ENTRESTO) 97-103 MG   rosuvastatin (CRESTOR) 20 MG tablet   Other Relevant Orders   CBC (Completed)   Comprehensive metabolic panel with GFR (Completed)   Type 2 diabetes mellitus with vascular disease (HCC) (Chronic)   Managed with Farxiga and Ozempic. Recent medication change from Trulicity to Ozempic due to availability issues. - Hold Farxiga and Ozempic the week before heart catheterization.      Relevant Medications   OZEMPIC, 2 MG/DOSE, 8 MG/3ML SOPN   dapagliflozin propanediol (FARXIGA) 10 MG TABS tablet   bisoprolol-hydrochlorothiazide  (ZIAC) 10-6.25 MG tablet   sacubitril-valsartan (ENTRESTO) 97-103 MG   rosuvastatin (CRESTOR) 20 MG tablet   Other Relevant Orders   CBC (Completed)   Comprehensive metabolic panel with GFR (Completed)  Other   Abnormal pharmacologic myocardial perfusion study - Primary   Myoview  suggests the known prior anterior infarct as well as apical infarct but there is also suggestion of possible inferior infarct with peri-infarct ischemia.  Recommendations cardiac catheterization.  Referred for catheterization by Dr. Jeronimo.  Will plan for cardiac catheterization on Monday, November 17.  See Informed Consent /Shared Decision Making statement below.  Preprocedural labs drawn.  Discussed holding Farxiga and Ozempic and continue Plavix .      Relevant Orders   CBC (Completed)   Comprehensive metabolic panel with GFR (Completed)   DOE (dyspnea on exertion)   Unclear if her exertional dyspnea is related to underlying lung disease, CHF or angina. Stress MRI suggests potentially new defect - with Inferior infarct &  peri-infarct ischemia.  Plan for Cardiac Cath & Possible PCI      Relevant Orders   CBC (Completed)   Comprehensive metabolic panel with GFR (Completed)         Informed Consent   Shared Decision Making/Informed Consent The risks [stroke (1 in 1000), death (1 in 1000), kidney failure [usually temporary] (1 in 500), bleeding (1 in 200), allergic reaction [possibly serious] (1 in 200)], benefits (diagnostic support and management of coronary artery disease) and alternatives of a cardiac catheterization with coronary angiography and possible percutaneous coronary interventionwere discussed in detail with Ms. Neyer and she is willing to proceed.      Follow-Up: Return in about 2 weeks (around 08/21/2024) for Post cath visit with APP, Followup with Dr. Jeronimo after initial post cath follow-up..  I spent 60 minutes in the care of Anna Noble today including reviewing studies (her old catheterization report and films reviewed as well as previous stress test performed at [facility]-26minutes), reviewing outside studies (recent echocardiogram and stress MRI from [facility]-6 minutes), face to face time discussing treatment options (27 minutes), reviewing records from clinic note from Dr. Juanita minutes, notes from her previous hospitalization during the infarct in 2001-4 minutes (7 minutes total), 11 minutes dictating, and documenting in the encounter.      Signed, Alm MICAEL Clay, MD, MS [name], M.D., M.S. Interventional Cardiologist  [facility] Pager # [phone]

## 2024-08-07 NOTE — Assessment & Plan Note (Signed)
 Prior anterior infarct noted on echo and cardiac MRI but also now suggestion of possible inferior infarct with peri-infarct ischemia.  Question is this is a new lesion or is just simply reflecting the inferoapical infarct from the LAD.  Plan for cardiac catheterization to reassess.

## 2024-08-07 NOTE — Assessment & Plan Note (Signed)
 Managed with rosuvastatin.  Unsure of her lipid levels.  Managed by primary cardiologist. - Continue rosuvastatin 20 mg daily.

## 2024-08-07 NOTE — Assessment & Plan Note (Signed)
 Unclear if her exertional dyspnea is related to underlying lung disease, CHF or angina. Stress MRI suggests potentially new defect - with Inferior infarct & peri-infarct ischemia.  Plan for Cardiac Cath & Possible PCI

## 2024-08-07 NOTE — H&P (View-Only) (Signed)
 Cardiology Office Note:  .   Date:  08/07/2024  ID:  [name], DOB Jun 08, 1956, MRN [id number] PCP: Waddell Palma, PA-C  Cardiologist: Dr. Jeronimo GLENWOOD [facility] Providers Cardiologist:  Norleen CHRISTELLA Jeronimo, MD     Chief Complaint  Patient presents with   New Patient (Initial Visit)    Interventional consult for cardiac catheterization.   Coronary Artery Disease    History of anterior STEMI with apical aneurysm.  Now with stress MRI showing also inferior infarct with possible ischemia    Patient Profile: .     Anna Noble is a  68 y.o. divorced female former smoker (quit in January 2025) with a PMH notable for Anterior MI, single-vessel CAD with ischemic cardiomyopathy, HTN and HLD who presents here for Interventional Cardiology Consult for abnormal Cardiac Stress MRI at the request of Jeronimo Norleen CHRISTELLA, MD-cardiologist from [facility].  PMH: CAD (2001)-90% LAD and 99% D2-LAD PCI.  (Associated with shock-balloon pump) at [facility] (EF 40 to 45% with apical aneurysm.- HTN HLD DM-2 Hypothyroidism Depression CKD 3 AA     Anna Noble was last seen at [facility] by Lamarr Satterfield, NP on January 10, 2020 (prior to this she had been followed by Dr. Lavona and then seen in consultation in the hospital by Dr. Loni).  She ordered a Myoview  stress test as opposed to proceeding with cardiac catheterization.  Subjective  Discussed the use of AI scribe software for clinical note transcription with the patient, who gave verbal consent to proceed.  History of Present Illness Anna Noble is a 68 year old female with coronary artery disease and prior myocardial infarction who presents with shortness of breath. She was referred by Dr. Onesimo for evaluation of coronary artery disease and consideration of heart catheterization.  She experiences shortness of breath, particularly when ascending stairs. No chest pain, tightness, or pressure is noted, and she  does not experience orthopnea or paroxysmal nocturnal dyspnea.  She only notes the chest tightness when she overdoes it in the setting.  She also denies palpitations, dizziness, or syncope.  Her history includes coronary artery disease with a prior myocardial infarction in 2001, for which she received a stent. Recent imaging, including a CT and MRI, revealed a blockage and an aneurysm. The patient recalls being told the aneurysm was related to scar tissue from her prior myocardial infarction. An echocardiogram showed mild asymmetric septal hypertrophy, no outflow obstruction, and an ejection fraction of 50-55%. A stress test showed mid to distal anterior hypokinesis and panapical hypokinesis.  Her current medications include clopidogrel  75 mg daily, rosuvastatin 20 mg daily, Entresto 97/103 mg twice daily, bisoprolol HCTZ 10/6.25 mg once daily, Farxiga 10 mg daily, Ozempic 2 mg weekly, lansoprazole 30 mg daily, and potassium chloride  20 mg daily. She carries nitroglycerin  but has not used it recently.  She quit smoking a year ago and denies any history of diabetes requiring insulin . She is currently on Farxiga and Ozempic for diabetes management.     Objective   FH: Mother and father hypertension.  Mother had diabetes type 2 and father had hyperlipidemia  Medications - Clopidogrel  75 mg daily - Farxiga 10 mg daily - Ozempic 2 mg injection once a week - Entresto 97-103 mg twice a day - Bisoprolol-HCTZ 10-6.25 mg daily - Rosuvastatin 20 mg daily  - Lansoprazole 30 mg daily - Potassium chloride  20 mg daily - Albuterol - Budesonide inhaler as needed - Ipratropium nasal inhaler -  Levothyroxine  25 mcg daily - Duloxetine  60 mg daily - Gabapentin  600 mg nightly - Levo cetirizine 5 mg daily -Ergocalciferol /vitamin D  2 1250 mcg daily  Studies Reviewed: Anna Noble   EKG Interpretation Date/Time:  Monday August 07 2024 14:12:36 EST Ventricular Rate:  75 PR Interval:  194 QRS Duration:  90 QT  Interval:  356 QTC Calculation: 397 R Axis:   26  Text Interpretation: Normal sinus rhythm Low voltage QRS Cannot rule out Anteroseptal infarct (cited on or before 11-Jun-2000) When compared with ECG of 12-Apr-2024 09:11, No significant change was found Confirmed by Anner Lenis ([id number]) on 08/07/2024 11:30:11 PM    Results DIAGNOSTIC Echocardiogram: Mild asymmetric septal hypertrophy, no outflow obstruction, EF 50-55% (01/17/2024) Cardiac Stress MRI [facility]): Mild systolic dysfunction with asymmetrical hypertrophy basal septal (14 mm) versus posterior wall 8 mm).  Does not meet criteria for hypertrophic cardiomyopathy.  Stress perfusion defect in the basal to mid inferior wall worse in stress than at rest suggestive of prior infarct with peri-infarct ischemia.  Also mid to apical anterior stress perfusion defect corresponding to a large area of LGE consistent with anterior infarct.  The anterior defect suggests viability in the mid to apical anterior wall but likely transmural infarct in the apical wall.  LVEF estimated 48%. => Cardiac catheterization recommended. (04/12/2024)   --- Myoview  Stress Test : Moderate sized mid-distal-distal lateral, apical fixed defect consistent with prior infarct but no ischemia.  EF 45%.  (02/02/2020) Echocardiogram: EF 40 to 45%.  Moderate hypokinesis of mid-apical inferior wall, apical segment and anterior wall.  GR 2 DD.  Mildly reduced RV function.  Mild AV sclerosis with no stenosis.  RAP estimated 8 mmHg. --- Cardiac Cath: Severely decreased LV function secondary to extensive apical lateral infarction.  One-vessel CAD with 99% stenosis in mid LAD followed by a long segment of 50%. => 2.5 mm 13 mm BX velocity BMS deployed at high atmospheres in the mid LAD.  (06/11/2000)  Risk Assessment/Calculations:          Physical Exam:   VS:  BP 136/82 (BP Location: Right Arm, Patient Position: Sitting, Cuff Size: Normal)   Pulse 75   Ht 5' 7 (1.702  m)   Wt 183 lb (83 kg)   SpO2 97%   BMI 28.66 kg/m    Wt Readings from Last 3 Encounters:  08/07/24 183 lb (83 kg)  07/29/21 186 lb (84.4 kg)  02/02/20 197 lb (89.4 kg)     GEN: Well nourished, well groomed in no acute distress; relatively healthy appearing. NECK: No JVD; No carotid bruits CARDIAC: Normal S1, S2; RRR, no murmurs, rubs, gallops RESPIRATORY:  Clear to auscultation without rales, wheezing or rhonchi ; nonlabored, good air movement. ABDOMEN: Soft, non-tender, non-distended EXTREMITIES:  No edema; No deformity      ASSESSMENT AND PLAN: .    Problem List Items Addressed This Visit       Cardiology Problems   Chronic heart failure with mildly reduced ejection fraction (HFmrEF) (HCC) (Chronic)   NYHA class II symptoms now with exertional dyspnea.  Unsure if the symptom is angina related or CHF related.  She is not having any PND orthopnea.  Euvolemic on exam. She does not require diuretic-having previously been on furosemide  she is now only on the low dose of HCTZ associated bisoprolol-HCTZ.  As she had only been on carvedilol  12.5 mg twice daily and enalapril .  Was switched to bisoprolol and Entresto.  -She is on max dose Entresto 97-103  mg twice daily and bisoprolol-HCTZ 10-6.25 mg daily. - She is on Farxiga 10 mg daily and Ozempic 2 mg weekly - Not currently on spironolactone or furosemide .       Relevant Medications   bisoprolol-hydrochlorothiazide  (ZIAC) 10-6.25 MG tablet   sacubitril-valsartan (ENTRESTO) 97-103 MG   rosuvastatin (CRESTOR) 20 MG tablet   Other Relevant Orders   CBC (Completed)   Comprehensive metabolic panel with GFR (Completed)   Coronary artery disease involving native coronary artery with angina pectoris = manifested as exertional dyspnea (Chronic)   Atherosclerotic heart disease of native coronary artery with prior anterior wall myocardial infarction and chronic systolic heart failure Prior anterior wall myocardial infarction with chronic  systolic heart failure. Recent stress test indicated mid to distal anterior hypokinesis with apical hypokinesis, consistent with prior LAD infarct and aneurysm. Echocardiogram showed mild asymmetric septal hypertrophy, EF 50-55%, grade one diastolic dysfunction, mild MR, and moderate dilatation. Discussed heart catheterization risks and benefits for diagnostic clarity and potential intervention. - Scheduled heart catheterization for Monday November 17th, 2025. - Instructed to hold Farxiga and Ozempic the week before the procedure. - Continue clopidogrel . - Ordered chemistry panel and blood count prior to catheterization.      Relevant Medications   bisoprolol-hydrochlorothiazide  (ZIAC) 10-6.25 MG tablet   sacubitril-valsartan (ENTRESTO) 97-103 MG   rosuvastatin (CRESTOR) 20 MG tablet   Essential hypertension, benign (Chronic)   BP well-controlled on adequate GDMT for ICM. - Continue Entresto 97-100 mg twice daily, bisoprolol-HCTZ 10-625 mg daily      Relevant Medications   bisoprolol-hydrochlorothiazide  (ZIAC) 10-6.25 MG tablet   sacubitril-valsartan (ENTRESTO) 97-103 MG   rosuvastatin (CRESTOR) 20 MG tablet   Other Relevant Orders   EKG 12-Lead (Completed)   CBC (Completed)   Comprehensive metabolic panel with GFR (Completed)   History of ST elevation myocardial infarction (STEMI) of anterior wall (HCC) (Chronic)   Prior anterior infarct noted on echo and cardiac MRI but also now suggestion of possible inferior infarct with peri-infarct ischemia.  Question is this is a new lesion or is just simply reflecting the inferoapical infarct from the LAD.  Plan for cardiac catheterization to reassess.      Relevant Medications   bisoprolol-hydrochlorothiazide  (ZIAC) 10-6.25 MG tablet   sacubitril-valsartan (ENTRESTO) 97-103 MG   rosuvastatin (CRESTOR) 20 MG tablet   Other Relevant Orders   EKG 12-Lead (Completed)   CBC (Completed)   Comprehensive metabolic panel with GFR (Completed)    Hyperlipidemia with target low density lipoprotein (LDL) cholesterol less than 55 mg/dL (Chronic)   Managed with rosuvastatin.  Unsure of her lipid levels.  Managed by primary cardiologist. - Continue rosuvastatin 20 mg daily.      Relevant Medications   bisoprolol-hydrochlorothiazide  (ZIAC) 10-6.25 MG tablet   sacubitril-valsartan (ENTRESTO) 97-103 MG   rosuvastatin (CRESTOR) 20 MG tablet   Other Relevant Orders   CBC (Completed)   Comprehensive metabolic panel with GFR (Completed)   Type 2 diabetes mellitus with vascular disease (HCC) (Chronic)   Managed with Farxiga and Ozempic. Recent medication change from Trulicity to Ozempic due to availability issues. - Hold Farxiga and Ozempic the week before heart catheterization.      Relevant Medications   OZEMPIC, 2 MG/DOSE, 8 MG/3ML SOPN   dapagliflozin propanediol (FARXIGA) 10 MG TABS tablet   bisoprolol-hydrochlorothiazide  (ZIAC) 10-6.25 MG tablet   sacubitril-valsartan (ENTRESTO) 97-103 MG   rosuvastatin (CRESTOR) 20 MG tablet   Other Relevant Orders   CBC (Completed)   Comprehensive metabolic panel with GFR (Completed)  Other   Abnormal pharmacologic myocardial perfusion study - Primary   Myoview  suggests the known prior anterior infarct as well as apical infarct but there is also suggestion of possible inferior infarct with peri-infarct ischemia.  Recommendations cardiac catheterization.  Referred for catheterization by Dr. Jeronimo.  Will plan for cardiac catheterization on Monday, November 17.  See Informed Consent /Shared Decision Making statement below.  Preprocedural labs drawn.  Discussed holding Farxiga and Ozempic and continue Plavix .      Relevant Orders   CBC (Completed)   Comprehensive metabolic panel with GFR (Completed)   DOE (dyspnea on exertion)   Unclear if her exertional dyspnea is related to underlying lung disease, CHF or angina. Stress MRI suggests potentially new defect - with Inferior infarct &  peri-infarct ischemia.  Plan for Cardiac Cath & Possible PCI      Relevant Orders   CBC (Completed)   Comprehensive metabolic panel with GFR (Completed)         Informed Consent   Shared Decision Making/Informed Consent The risks [stroke (1 in 1000), death (1 in 1000), kidney failure [usually temporary] (1 in 500), bleeding (1 in 200), allergic reaction [possibly serious] (1 in 200)], benefits (diagnostic support and management of coronary artery disease) and alternatives of a cardiac catheterization with coronary angiography and possible percutaneous coronary interventionwere discussed in detail with Anna Noble and she is willing to proceed.      Follow-Up: Return in about 2 weeks (around 08/21/2024) for Post cath visit with APP, Followup with Dr. Jeronimo after initial post cath follow-up..  I spent 60 minutes in the care of Anna Noble today including reviewing studies (her old catheterization report and films reviewed as well as previous stress test performed at [facility]-26minutes), reviewing outside studies (recent echocardiogram and stress MRI from [facility]-6 minutes), face to face time discussing treatment options (27 minutes), reviewing records from clinic note from Dr. Juanita minutes, notes from her previous hospitalization during the infarct in 2001-4 minutes (7 minutes total), 11 minutes dictating, and documenting in the encounter.      Signed, Alm MICAEL Clay, MD, MS [name], M.D., M.S. Interventional Cardiologist  [facility] Pager # [phone]

## 2024-08-07 NOTE — Assessment & Plan Note (Signed)
 Atherosclerotic heart disease of native coronary artery with prior anterior wall myocardial infarction and chronic systolic heart failure Prior anterior wall myocardial infarction with chronic systolic heart failure. Recent stress test indicated mid to distal anterior hypokinesis with apical hypokinesis, consistent with prior LAD infarct and aneurysm. Echocardiogram showed mild asymmetric septal hypertrophy, EF 50-55%, grade one diastolic dysfunction, mild MR, and moderate dilatation. Discussed heart catheterization risks and benefits for diagnostic clarity and potential intervention. - Scheduled heart catheterization for Monday November 17th, 2025. - Instructed to hold Farxiga and Ozempic the week before the procedure. - Continue clopidogrel . - Ordered chemistry panel and blood count prior to catheterization.

## 2024-08-07 NOTE — Patient Instructions (Addendum)
 Medication Instructions:  No changes  *If you need a refill on your cardiac medications before your next appointment, please call your pharmacy*   Lab Work: CMP CBC If you have labs (blood work) drawn today and your tests are completely normal, you will receive your results only by: MyChart Message (if you have MyChart) OR A paper copy in the mail If you have any lab test that is abnormal or we need to change your treatment, we will call you to review the results.   Testing/Procedures: Your physician has requested that you have a cardiac catheterization. Cardiac catheterization is used to diagnose and/or treat various heart conditions. Doctors may recommend this procedure for a number of different reasons. The most common reason is to evaluate chest pain. Chest pain can be a symptom of coronary artery disease (CAD), and cardiac catheterization can show whether plaque is narrowing or blocking your heart's arteries. This procedure is also used to evaluate the valves, as well as measure the blood flow and oxygen levels in different parts of your heart. For further information please visit [email or web address] Please follow instruction sheet, as given.    Follow-Up: At [facility], you and your health needs are our priority.  As part of our continuing mission to provide you with exceptional heart care, we have created designated Provider Care Teams.  These Care Teams include your primary Cardiologist (physician) and Advanced Practice Providers (APPs -  Physician Assistants and Nurse Practitioners) who all work together to provide you with the care you need, when you need it.     Your next appointment:   2 weeks  The format for your next appointment:   In Person  Provider:   Aline Door, PA-C, Damien Braver, NP, or Katlyn West, NP  Then, Norleen CHRISTELLA Blare, MD   .   Other Instructions   [facility] A DEPT OF [facility] AT [facility] A DEPT OF  THE [place] [address] [place] [address] Dept: [phone] Loc: [phone]  JERRELL HART  08/07/2024  You are scheduled for a Cardiac Catheterization on Monday, November 17 with Dr. [name].  1. Please arrive at the [facility] (Main Entrance A) at [facility]: [address] [place], [place] [address] at 7:00 AM (This time is 2 hour(s) before your procedure to ensure your preparation).   Free valet parking service is available. You will check in at ADMITTING. The support person will be asked to wait in the waiting room.  It is OK to have someone drop you off and come back when you are ready to be discharged.    Special note: Every effort is made to have your procedure done on time. Please understand that emergencies sometimes delay scheduled procedures.  2. Diet: Nothing to eat after midnight.   3. Hydration: You need to be well hydrated before your procedure. On November 17, you may drink approved liquids (see below) until 2 hours before the procedure, with 16 oz of water as your last intake.   List of approved liquids water, clear juice, clear tea, black coffee, fruit juices, non-citric and without pulp, carbonated beverages, Gatorade, Kool -Aid, plain Jello-O and plain ice popsicles.  4. Labs: You will need to have blood drawn on Monday, November 10 at [facility] (1st Floor), [address], [place], [place] [address]. You do not need to be fasting.  5. Medication instructions in preparation for your procedure:   Contrast Allergy: No    Do not take Entresto  the morning of the procedure  Do Not take Farxiga 10 mg after Thursday, November 13 , 2025  Do not take Ozempic  after Thursday, November 7,2025    On the morning of your procedure, take your Aspirin 81 mg and Plavix /Clopidogrel  and any morning medicines NOT listed above.  You may use sips of water.  6. Plan to go home the same day,  you will only stay overnight if medically necessary. 7. Bring a current list of your medications and current insurance cards. 8. You MUST have a responsible person to drive you home. 9. Someone MUST be with you the first 24 hours after you arrive home or your discharge will be delayed. 10. Please wear clothes that are easy to get on and off and wear slip-on shoes.  Thank you for allowing us  to care for you!   -- [facility]

## 2024-08-07 NOTE — Assessment & Plan Note (Addendum)
 NYHA class II symptoms now with exertional dyspnea.  Unsure if the symptom is angina related or CHF related.  She is not having any PND orthopnea.  Euvolemic on exam. She does not require diuretic-having previously been on furosemide  she is now only on the low dose of HCTZ associated bisoprolol-HCTZ.  As she had only been on carvedilol  12.5 mg twice daily and enalapril .  Was switched to bisoprolol and Entresto.  -She is on max dose Entresto 97-103 mg twice daily and bisoprolol-HCTZ 10-6.25 mg daily. - She is on Farxiga 10 mg daily and Ozempic 2 mg weekly - Not currently on spironolactone or furosemide .

## 2024-08-08 LAB — COMPREHENSIVE METABOLIC PANEL WITH GFR
ALT: 6 IU/L (ref 0–32)
AST: 14 IU/L (ref 0–40)
Albumin: 4.5 g/dL (ref 3.9–4.9)
Alkaline Phosphatase: 151 IU/L — AB (ref 49–135)
BUN/Creatinine Ratio: 20 (ref 12–28)
BUN: 22 mg/dL (ref 8–27)
Bilirubin Total: 0.2 mg/dL (ref 0.0–1.2)
CO2: 22 mmol/L (ref 20–29)
Calcium: 9.4 mg/dL (ref 8.7–10.3)
Chloride: 100 mmol/L (ref 96–106)
Creatinine, Ser: 1.11 mg/dL — AB (ref 0.57–1.00)
Globulin, Total: 2.7 g/dL (ref 1.5–4.5)
Glucose: 138 mg/dL — AB (ref 70–99)
Potassium: 4.4 mmol/L (ref 3.5–5.2)
Sodium: 138 mmol/L (ref 134–144)
Total Protein: 7.2 g/dL (ref 6.0–8.5)
eGFR: 54 mL/min/1.73 — AB (ref >59)

## 2024-08-08 LAB — CBC
Hematocrit: 42.5 % (ref 34.0–46.6)
Hemoglobin: 13.6 g/dL (ref 11.1–15.9)
MCH: 29.4 pg (ref 26.6–33.0)
MCHC: 32 g/dL (ref 31.5–35.7)
MCV: 92 fL (ref 79–97)
Platelets: 249 x10E3/uL (ref 150–450)
RBC: 4.62 x10E6/uL (ref 3.77–5.28)
RDW: 12.9 % (ref 11.7–15.4)
WBC: 10.3 x10E3/uL (ref 3.4–10.8)

## 2024-08-10 ENCOUNTER — Telehealth: Payer: Self-pay | Admitting: *Deleted

## 2024-08-10 NOTE — Telephone Encounter (Signed)
 Cardiac Catheterization scheduled at [facility] for: Monday August 14, 2024 9 AM Arrival time [facility] Main Entrance A at: 7 AM  Diet: -Nothing to eat after midnight.  Hydration: -May drink clear liquids until 2 hours before the procedure.  Approved liquids: Water, clear tea, black coffee, fruit juices-non-citric and without pulp,Gatorade, plain Jello/popsicles.   -Please drink 16 oz of water 2 hours before procedure.  Medication instructions: -Hold:  Bisoprolol/hydrochlorothiazide /Entresto-PM prior /AM of procedure-per protocol GFR < 60 (54)  Patient tells me she does not take Lasix  regularly  Farxiga-AM of procedure -Other usual morning medications can be taken including Plavix  75 mg  Patient tells me Ozempic is weekly on Fridays-she will hold 08/11/24 per MD  Plan to go home the same day, you will only stay overnight if medically necessary.  You must have responsible adult to drive you home.  Someone must be with you the first 24 hours after you arrive home.  Reviewed procedure instructions with patient.

## 2024-08-14 ENCOUNTER — Telehealth: Payer: Self-pay | Admitting: Cardiology

## 2024-08-14 ENCOUNTER — Encounter (HOSPITAL_COMMUNITY)
Admission: RE | Disposition: A | Discharge status: Home / Self Care | Payer: Self-pay | Source: Home / Self Care | Attending: Cardiology

## 2024-08-14 ENCOUNTER — Ambulatory Visit (HOSPITAL_COMMUNITY)
Admission: RE | Admit: 2024-08-14 | Discharge: 2024-08-14 | Disposition: A | Discharge status: Home / Self Care | Attending: Cardiology | Admitting: Cardiology

## 2024-08-14 ENCOUNTER — Other Ambulatory Visit: Payer: Self-pay

## 2024-08-14 DIAGNOSIS — I130 Hypertensive heart and chronic kidney disease with heart failure and stage 1 through stage 4 chronic kidney disease, or unspecified chronic kidney disease: Secondary | ICD-10-CM | POA: Diagnosis not present

## 2024-08-14 DIAGNOSIS — Z7985 Long-term (current) use of injectable non-insulin antidiabetic drugs: Secondary | ICD-10-CM | POA: Diagnosis not present

## 2024-08-14 DIAGNOSIS — Z7984 Long term (current) use of oral hypoglycemic drugs: Secondary | ICD-10-CM | POA: Diagnosis not present

## 2024-08-14 DIAGNOSIS — E785 Hyperlipidemia, unspecified: Secondary | ICD-10-CM | POA: Diagnosis not present

## 2024-08-14 DIAGNOSIS — I252 Old myocardial infarction: Secondary | ICD-10-CM | POA: Diagnosis not present

## 2024-08-14 DIAGNOSIS — I2582 Chronic total occlusion of coronary artery: Secondary | ICD-10-CM | POA: Diagnosis not present

## 2024-08-14 DIAGNOSIS — T82855A Stenosis of coronary artery stent, initial encounter: Secondary | ICD-10-CM | POA: Insufficient documentation

## 2024-08-14 DIAGNOSIS — I255 Ischemic cardiomyopathy: Secondary | ICD-10-CM | POA: Insufficient documentation

## 2024-08-14 DIAGNOSIS — Z87891 Personal history of nicotine dependence: Secondary | ICD-10-CM | POA: Insufficient documentation

## 2024-08-14 DIAGNOSIS — I5022 Chronic systolic (congestive) heart failure: Secondary | ICD-10-CM | POA: Insufficient documentation

## 2024-08-14 DIAGNOSIS — Z79899 Other long term (current) drug therapy: Secondary | ICD-10-CM | POA: Insufficient documentation

## 2024-08-14 DIAGNOSIS — Z7902 Long term (current) use of antithrombotics/antiplatelets: Secondary | ICD-10-CM | POA: Diagnosis not present

## 2024-08-14 DIAGNOSIS — Y831 Surgical operation with implant of artificial internal device as the cause of abnormal reaction of the patient, or of later complication, without mention of misadventure at the time of the procedure: Secondary | ICD-10-CM | POA: Diagnosis not present

## 2024-08-14 DIAGNOSIS — I25118 Atherosclerotic heart disease of native coronary artery with other forms of angina pectoris: Secondary | ICD-10-CM | POA: Diagnosis present

## 2024-08-14 DIAGNOSIS — E1122 Type 2 diabetes mellitus with diabetic chronic kidney disease: Secondary | ICD-10-CM | POA: Diagnosis not present

## 2024-08-14 DIAGNOSIS — N183 Chronic kidney disease, stage 3 unspecified: Secondary | ICD-10-CM | POA: Insufficient documentation

## 2024-08-14 HISTORY — PX: LEFT HEART CATH AND CORONARY ANGIOGRAPHY: CATH118249

## 2024-08-14 LAB — GLUCOSE, CAPILLARY: Glucose-Capillary: 145 mg/dL — ABNORMAL HIGH (ref 70–99)

## 2024-08-14 SURGERY — LEFT HEART CATH AND CORONARY ANGIOGRAPHY
Anesthesia: LOCAL

## 2024-08-14 MED ORDER — HEPARIN SODIUM (PORCINE) 1000 UNIT/ML IJ SOLN
INTRAMUSCULAR | Status: DC | PRN
Start: 2024-08-14 — End: 2024-08-14
  Administered 2024-08-14: 4000 [IU] via INTRAVENOUS

## 2024-08-14 MED ORDER — VERAPAMIL HCL 2.5 MG/ML IV SOLN
INTRAVENOUS | Status: AC
  Filled 2024-08-14: qty 2

## 2024-08-14 MED ORDER — VERAPAMIL HCL 2.5 MG/ML IV SOLN
INTRAVENOUS | Status: DC | PRN
  Administered 2024-08-14: 10 mL via INTRA_ARTERIAL

## 2024-08-14 MED ORDER — SODIUM CHLORIDE 0.9% FLUSH: 3.0000 mL | Freq: Two times a day (BID) | INTRAVENOUS | Status: DC

## 2024-08-14 MED ORDER — MIDAZOLAM HCL (PF) 2 MG/2ML IJ SOLN
INTRAMUSCULAR | Status: DC | PRN
Start: 2024-08-14 — End: 2024-08-14
  Administered 2024-08-14: 1 mg via INTRAVENOUS

## 2024-08-14 MED ORDER — SODIUM CHLORIDE 0.9% FLUSH
3.0000 mL | INTRAVENOUS | Status: DC | PRN
Start: 2024-08-14 — End: 2024-08-14

## 2024-08-14 MED ORDER — MIDAZOLAM HCL 2 MG/2ML IJ SOLN
INTRAMUSCULAR | Status: AC
  Filled 2024-08-14: qty 2

## 2024-08-14 MED ORDER — LABETALOL HCL 5 MG/ML IV SOLN: 10.0000 mg | INTRAVENOUS | Status: DC | PRN

## 2024-08-14 MED ORDER — FREE WATER: 500.0000 mL | Freq: Once | Status: DC

## 2024-08-14 MED ORDER — HEPARIN SODIUM (PORCINE) 1000 UNIT/ML IJ SOLN
INTRAMUSCULAR | Status: AC
Start: 2024-08-14 — End: 2024-08-14
  Filled 2024-08-14: qty 10

## 2024-08-14 MED ORDER — SODIUM CHLORIDE 0.9% FLUSH: 3.0000 mL | INTRAVENOUS | Status: DC | PRN

## 2024-08-14 MED ORDER — FENTANYL CITRATE (PF) 100 MCG/2ML IJ SOLN
INTRAMUSCULAR | Status: AC
  Filled 2024-08-14: qty 2

## 2024-08-14 MED ORDER — SODIUM CHLORIDE 0.9 % IV SOLN: 250.0000 mL | INTRAVENOUS | Status: DC | PRN

## 2024-08-14 MED ORDER — HYDRALAZINE HCL 20 MG/ML IJ SOLN: 10.0000 mg | INTRAMUSCULAR | Status: DC | PRN

## 2024-08-14 MED ORDER — IOHEXOL 350 MG/ML SOLN
INTRAVENOUS | Status: DC | PRN
  Administered 2024-08-14: 30 mL

## 2024-08-14 MED ORDER — LIDOCAINE HCL (PF) 1 % IJ SOLN
INTRAMUSCULAR | Status: AC
  Filled 2024-08-14: qty 30

## 2024-08-14 MED ORDER — CLOPIDOGREL BISULFATE 75 MG PO TABS: 75.0000 mg | ORAL_TABLET | ORAL | Status: DC

## 2024-08-14 MED ORDER — HEPARIN (PORCINE) IN NACL 1000-0.9 UT/500ML-% IV SOLN
INTRAVENOUS | Status: DC | PRN
Start: 2024-08-14 — End: 2024-08-14
  Administered 2024-08-14 (×2): 500 mL

## 2024-08-14 MED ORDER — ACETAMINOPHEN 325 MG PO TABS: 650.0000 mg | ORAL_TABLET | ORAL | Status: DC | PRN

## 2024-08-14 MED ORDER — LIDOCAINE HCL (PF) 1 % IJ SOLN
INTRAMUSCULAR | Status: DC | PRN
Start: 2024-08-14 — End: 2024-08-14
  Administered 2024-08-14: 2 mL

## 2024-08-14 MED ORDER — FENTANYL CITRATE (PF) 100 MCG/2ML IJ SOLN
INTRAMUSCULAR | Status: DC | PRN
  Administered 2024-08-14: 25 ug via INTRAVENOUS

## 2024-08-14 MED ORDER — ONDANSETRON HCL 4 MG/2ML IJ SOLN: 4.0000 mg | Freq: Four times a day (QID) | INTRAMUSCULAR | Status: DC | PRN

## 2024-08-14 SURGICAL SUPPLY — 10 items
CATH INFINITI 5 FR JL3.5 (CATHETERS) IMPLANT
CATH INFINITI AMBI 5FR TG (CATHETERS) IMPLANT
CATH INFINITI JR4 5F (CATHETERS) IMPLANT
DEVICE RAD COMP TR BAND LRG (VASCULAR PRODUCTS) IMPLANT
GLIDESHEATH SLEND SS 6F .021 (SHEATH) IMPLANT
GUIDEWIRE INQWIRE 1.5J.035X260 (WIRE) IMPLANT
KIT NAMIC PS PRESSURIZED FLUID (KITS) IMPLANT
PACK CARDIAC CATHETERIZATION (CUSTOM PROCEDURE TRAY) ×1 IMPLANT
SET ATX-X65L (MISCELLANEOUS) IMPLANT
SHEATH PROBE COVER 6X72 (BAG) IMPLANT

## 2024-08-14 NOTE — Telephone Encounter (Signed)
 Patient is calling because she has a procedure on 08/18/24 and would like to know which medications she should take or should not take before her procedure. Please advise.

## 2024-08-14 NOTE — Interval H&P Note (Signed)
 History and Physical Interval Note:  08/14/2024 7:31 AM  Anna Noble  has presented today for surgery, with the diagnosis of cad - dysena.  The various methods of treatment have been discussed with the patient and family. After consideration of risks, benefits and other options for treatment, the patient has consented to  Procedure(s): LEFT HEART CATH AND CORONARY ANGIOGRAPHY (N/A)  PERCUTANEOUS CORONARY INTERVENTION  as a surgical intervention.  The patient's history has been reviewed, patient examined, no change in status, stable for surgery.  I have reviewed the patient's chart and labs.  Questions were answered to the patient's satisfaction.    Cath Lab Visit (complete for each Cath Lab visit)  Clinical Evaluation Leading to the Procedure:   ACS: No.  Non-ACS:    Anginal Classification: CCS II  Anti-ischemic medical therapy: Minimal Therapy (1 class of medications)  Non-Invasive Test Results: Intermediate-risk stress test findings: cardiac mortality 1-3%/year  Prior CABG: No previous CABG     Anna Noble

## 2024-08-14 NOTE — Discharge Instructions (Signed)

## 2024-08-14 NOTE — Progress Notes (Signed)
 Discharge instructions reviewed with patient and Son Rockey at the bedside. Denies questions or concerns. TR Band removed no s/s of complications at incision site. Tolerated PO intake.  PT ambulated in the hallway was able to void in the bathroom without difficulty. PT escorted from the unit via wheel chair to personal vehicle.

## 2024-08-14 NOTE — Telephone Encounter (Signed)
 Spoke with pt regarding her procedure. Went over medication instructions from [employer]. Pt verbalized understanding. All questions if any were answered.

## 2024-08-15 ENCOUNTER — Telehealth: Payer: Self-pay | Admitting: *Deleted

## 2024-08-15 ENCOUNTER — Encounter (HOSPITAL_COMMUNITY): Payer: Self-pay | Admitting: Cardiology

## 2024-08-15 DIAGNOSIS — R9430 Abnormal result of cardiovascular function study, unspecified: Secondary | ICD-10-CM

## 2024-08-15 DIAGNOSIS — Z01810 Encounter for preprocedural cardiovascular examination: Secondary | ICD-10-CM

## 2024-08-15 DIAGNOSIS — R0609 Other forms of dyspnea: Secondary | ICD-10-CM

## 2024-08-15 DIAGNOSIS — I25118 Atherosclerotic heart disease of native coronary artery with other forms of angina pectoris: Secondary | ICD-10-CM

## 2024-08-15 LAB — BASIC METABOLIC PANEL WITH GFR
BUN/Creatinine Ratio: 15 (ref 12–28)
BUN: 16 mg/dL (ref 8–27)
CO2: 24 mmol/L (ref 20–29)
Calcium: 9.5 mg/dL (ref 8.7–10.3)
Chloride: 99 mmol/L (ref 96–106)
Creatinine, Ser: 1.05 mg/dL — ABNORMAL HIGH (ref 0.57–1.00)
Glucose: 139 mg/dL — ABNORMAL HIGH (ref 70–99)
Potassium: 4.7 mmol/L (ref 3.5–5.2)
Sodium: 138 mmol/L (ref 134–144)
eGFR: 58 mL/min/1.73 — ABNORMAL LOW (ref >59)

## 2024-08-15 NOTE — Telephone Encounter (Signed)
 Order echo as as given by written order from Dr Anner.

## 2024-08-15 NOTE — Telephone Encounter (Addendum)
 Coronary Stent scheduled at [facility] for: Friday August 18, 2024 7:30 AM Arrival time [facility] Main Entrance A at: 5:30 AM  Diet: -Nothing to eat after midnight.  Hydration: -May drink clear liquids until 2 hours before the procedure.  Approved liquids: Water, clear tea, black coffee, fruit juices-non-citric and without pulp,Gatorade, plain Jello/popsicles.   -Please drink 16 oz of water 2 hours before procedure.  Medication instructions: -Hold:  Bisoprolol/HCT/KCl/Entresto-day before and day of procedure-per protocol GFR <60  Pt tells me she does not take lasix  regularly.  Patient will get BMP/CBC today and will call tomorrow to let her know any changes in instructions  Ozempic-weekly on Fridays-pt will continue to hold  Farxiga-AM of procedure -Other usual morning medications can be taken including Plavix  75 mg.  Plan to go home the same day, you will only stay overnight if medically necessary.  You must have responsible adult to drive you home.  Someone must be with you the first 24 hours after you arrive home.  Patient plans to get pre-procedure BMP/CBC today at [facility].

## 2024-08-15 NOTE — Telephone Encounter (Signed)
 Called patient to inform her  that the surgeon would like a more current Echo.  The office will be contacting her with  where the Echo will done.,ut it is schedule for 08/25/24 at 2 PM  Any question may call back

## 2024-08-15 NOTE — Addendum Note (Signed)
 Addended by: Laquan Beier M on: 08/15/2024 02:41 PM   Modules accepted: Orders

## 2024-08-15 NOTE — Telephone Encounter (Signed)
 Anna Noble - we are planning CVTS consult on 12/4 - can u help me get her in for an Echo b4 that?  [facility]  08/15/24

## 2024-08-15 NOTE — Telephone Encounter (Addendum)
 Per Dr Anner: -Cancel Coronary Stent Intervention (this has been cancelled) scheduled 08/18/24 for now pending TCTS consult for CABG with Dr Daniel 08/31/24.  -Continue Plavix  until 08/27/24 then discontinue.  Patient is aware of above recommendations.

## 2024-08-16 ENCOUNTER — Ambulatory Visit: Admitting: Cardiology

## 2024-08-16 LAB — CBC
Hematocrit: 42.3 % (ref 34.0–46.6)
Hemoglobin: 13.8 g/dL (ref 11.1–15.9)
MCH: 29.7 pg (ref 26.6–33.0)
MCHC: 32.6 g/dL (ref 31.5–35.7)
MCV: 91 fL (ref 79–97)
Platelets: 216 x10E3/uL (ref 150–450)
RBC: 4.65 x10E6/uL (ref 3.77–5.28)
RDW: 12.8 % (ref 11.7–15.4)
WBC: 8.7 x10E3/uL (ref 3.4–10.8)

## 2024-08-17 ENCOUNTER — Telehealth: Payer: Self-pay | Admitting: Cardiology

## 2024-08-17 NOTE — Telephone Encounter (Signed)
   Brief Telephone Note:  As part of our plan from postcardiac catheterization on Monday, 08/14/2024, I had planned to consult with cardiac surgery to discuss if she would potentially be a CABG candidate.  We tentatively scheduled for PCI in the chance that they felt that she would not be a candidate based on anatomy.  Below is the secure chat conversation between myself and the cardiac surgeons:    My response in blue: I did call [name] and we discussed the suggestions from both Dr. Daniel & Dr. Shyrl that they felt the RCA would be approachable from a CABG standpoint.  I suggested that complete revascularization with two-vessel CABG would likely provide better long-term durability especially in light of the fact that the LAD stent is likely undersized and would have to be aggressively postdilated which would be potentially difficult.  LIMA graft would have better durability and this would allow us  to also revascularize the RCA.  With shared decision making after discussing both options of CABG versus PCI, the patient agreed that CABG may be a better option.  She is agreeable to meet with the cardiac surgeons to discuss CABG. She is on Plavix  which she we will continue until she sees cardiac surgery and has her prescription surgery scheduled. 2D echocardiogram was ordered to be done prior to visit per recommendation.     [name], MD

## 2024-08-18 ENCOUNTER — Encounter (HOSPITAL_COMMUNITY): Admission: RE | Payer: Self-pay | Source: Home / Self Care

## 2024-08-18 ENCOUNTER — Ambulatory Visit (HOSPITAL_COMMUNITY): Admission: RE | Admit: 2024-08-18 | Source: Home / Self Care | Admitting: Cardiology

## 2024-08-18 SURGERY — CORONARY STENT INTERVENTION
Anesthesia: LOCAL

## 2024-08-25 ENCOUNTER — Ambulatory Visit (HOSPITAL_COMMUNITY)
Admission: RE | Admit: 2024-08-25 | Discharge: 2024-08-25 | Disposition: A | Source: Ambulatory Visit | Attending: Cardiology | Admitting: Cardiology

## 2024-08-25 DIAGNOSIS — R9430 Abnormal result of cardiovascular function study, unspecified: Secondary | ICD-10-CM | POA: Diagnosis not present

## 2024-08-25 DIAGNOSIS — R0600 Dyspnea, unspecified: Secondary | ICD-10-CM | POA: Diagnosis not present

## 2024-08-25 DIAGNOSIS — Z01810 Encounter for preprocedural cardiovascular examination: Secondary | ICD-10-CM | POA: Insufficient documentation

## 2024-08-25 DIAGNOSIS — R0609 Other forms of dyspnea: Secondary | ICD-10-CM

## 2024-08-25 DIAGNOSIS — I25118 Atherosclerotic heart disease of native coronary artery with other forms of angina pectoris: Secondary | ICD-10-CM | POA: Insufficient documentation

## 2024-08-25 DIAGNOSIS — I080 Rheumatic disorders of both mitral and aortic valves: Secondary | ICD-10-CM | POA: Insufficient documentation

## 2024-08-25 LAB — ECHOCARDIOGRAM COMPLETE
Area-P 1/2: 3.21 cm2
S' Lateral: 3.5 cm
Single Plane A4C EF: 45.6 %

## 2024-08-25 NOTE — Progress Notes (Signed)
  Echocardiogram 2D Echocardiogram has been performed.  Koleen KANDICE Popper, RDCS 08/25/2024, 2:28 PM

## 2024-08-26 ENCOUNTER — Ambulatory Visit: Payer: Self-pay | Admitting: Cardiology

## 2024-08-31 ENCOUNTER — Encounter: Payer: Self-pay | Admitting: *Deleted

## 2024-08-31 ENCOUNTER — Other Ambulatory Visit: Payer: Self-pay

## 2024-08-31 ENCOUNTER — Other Ambulatory Visit: Payer: Self-pay | Admitting: *Deleted

## 2024-08-31 ENCOUNTER — Ambulatory Visit

## 2024-08-31 VITALS — BP 123/73 | HR 78 | Resp 20 | Ht 67.0 in | Wt 180.0 lb

## 2024-08-31 DIAGNOSIS — I25119 Atherosclerotic heart disease of native coronary artery with unspecified angina pectoris: Secondary | ICD-10-CM

## 2024-08-31 NOTE — Patient Instructions (Signed)
 Bring a bra with you to the hospital on the day of surgery.  Look for something that is soft and comfortable that provides some support but is not constricting.  Below is an example of a bra that is available on [employer].

## 2024-08-31 NOTE — Progress Notes (Unsigned)
 [address], [place] [address]             [phone]    [name] [facility] Medical Record #[id number] Date of Birth: 11-Mar-1956  Referring: Anner Alm ORN, MD Primary Care: Waddell Palma, PA-C Primary Cardiologist:John CHRISTELLA Blare, MD  Chief Complaint:    Chief Complaint  Patient presents with   Coronary Artery Disease    Surgical consult/ Cardiac Cath 10/15/23/ MR Cardiac 04/12/24/ ECHO 08/25/24    History of Present Illness:     Anna Noble is a 68 y.o. female who presents for surgical evaluation of multivessel coronary artery disease.  She has a history of hypertension hyperlipidemia and diabetes.  She had a heart attack in 2001 at which time a stent was placed in the LAD.  She is having worsening shortness of breath especially with exertion.  She has also felt fatigue.  She has no chest pain.  She does check her glucose at home and it is usually in the 130s.  She used to enjoy knitting, crochet, and crafts, but recently she does not have the energy to do so.  She can climb a flight of stairs but feels short of breath at the top.  She lives with her 2 sons and their father in a house.  She is able to climb a flight of stairs in the house but it is progressively getting harder.  She is a former smoker who quit 1 year ago.  Prior to that she was smoking 1 pack/day and started at 68 years old (approx 45 pack years).  She has very little alcohol use but does smoke marijuana every night to help with restless leg.  She denies any other illicit drug use.  BG at home usually 130s.  LHC (08/14/24): RCA CTO with faint filling of PL from L-R collaterals.  LAD stent with 80% ISR. TTE (08/25/24): EF 40-45%, RV normal.  Mild MR, no MS, no AI, no AS. Apex appears aneurysmal cMRI (04/12/24):  Apex non-viable, but viability of mid to apical anterior wall. Chest CT (01/02/20): No significant aortic calcium .  Proximal calcium  of the LAD noted. PFT (N/A):  TBD  Past Medical and Surgical History: Previous Chest Surgery: No Previous Chest Radiation: No Diabetes Mellitus: yes.  HbA1C 7.2% (January 19, 2018) Anticoagulation: Plavix    Creatinine:  Lab Results  Component Value Date   CREATININE 1.05 (H) 08/15/2024   CREATININE 1.11 (H) 08/07/2024   CREATININE 0.72 02/06/2021     Past Medical History:  Diagnosis Date   Abnormal Pap smear of cervix    ascus hpv hr+   Acute myocardial infarction of other anterior wall, episode of care unspecified    Allergy    Arthritis    Coronary atherosclerosis of native coronary artery    Depression    DM (diabetes mellitus) (HCC)    Dysmetabolic syndrome X    HLD (hyperlipidemia)    HTN (hypertension)    Hypothyroidism    Neuromuscular disorder (HCC)    Obesity, unspecified    Tobacco use disorder     Past Surgical History:  Procedure Laterality Date   ACHILLES TENDON SURGERY Left    CARDIAC CATHETERIZATION     Cardiac stenting     EYE SURGERY     HAND SURGERY Right    ganglion cyst, bone graft   KNEE ARTHROSCOPY Right 1976   LEFT HEART CATH AND CORONARY ANGIOGRAPHY N/A 08/14/2024  Procedure: LEFT HEART CATH AND CORONARY ANGIOGRAPHY;  Surgeon: Anner Alm ORN, MD;  Location: [facility] INVASIVE CV LAB;  Service: Cardiovascular;  Laterality: N/A;   STRABISMUS SURGERY Left 1959   TOE SURGERY Right    x 3   TUBAL LIGATION      Social History:  Social History   Tobacco Use  Smoking Status Some Days   Current packs/day: 0.50   Average packs/day: 0.5 packs/day for 30.0 years (15.0 ttl pk-yrs)   Types: E-cigarettes, Cigarettes  Smokeless Tobacco Current  Tobacco Comments   Pt stated she smokes an e-cigarette some days.    Social History   Substance and Sexual Activity  Alcohol Use Not Currently   Comment: once a month-beer     Allergies  Allergen Reactions   Penicillins Anaphylaxis   Aspirin  Hives   Nsaids Hives   Latex Rash    Medications: Asprin: No Statin: Yes Beta  Blocker: Yes  Ace Inhibitor: Yes Anti-Coagulation: Yes (Plavix )  Current Outpatient Medications  Medication Sig Dispense Refill   ACCU-CHEK AVIVA PLUS test strip SMARTSIG:1 Strip(s) Via Meter Every Morning     Accu-Chek Softclix Lancets lancets SMARTSIG:Topical     albuterol  (PROVENTIL  HFA;VENTOLIN  HFA) 108 (90 Base) MCG/ACT inhaler Inhale 2 puffs into the lungs every 4 (four) hours as needed for wheezing or shortness of breath (cough, shortness of breath or wheezing.). 1 Inhaler 0   ascorbic acid (VITAMIN C) 500 MG tablet Take 500 mg by mouth daily.     bisoprolol -hydrochlorothiazide  (ZIAC) 10-6.25 MG tablet Take 1 tablet by mouth daily.     Blood Glucose Monitoring Suppl (ACCU-CHEK AVIVA PLUS) w/Device KIT Use 1 kit as directed once a day  to check fasting blood sugar for diabetes     CINNAMON PO Take 1,000 mg by mouth daily.     clopidogrel  (PLAVIX ) 75 MG tablet Take 1 tablet (75 mg total) by mouth daily. Per Dr Sherrlyn take Plavix  until 08/27/24 then discontinue     cyanocobalamin (VITAMIN B12) 1000 MCG tablet Take 1,000 mcg by mouth daily.     cyclobenzaprine  (FLEXERIL ) 5 MG tablet Take 5 mg by mouth at bedtime.     dapagliflozin propanediol (FARXIGA) 10 MG TABS tablet Take 10 mg by mouth daily.     doxylamine, Sleep, (UNISOM) 25 MG tablet Take 25 mg by mouth at bedtime.     DULoxetine  (CYMBALTA ) 60 MG capsule Take 60 mg by mouth daily.     ergocalciferol  (VITAMIN D2) 1.25 MG (50000 UT) capsule Take 50,000 Units by mouth once a week.     gabapentin  (NEURONTIN ) 600 MG tablet Take 600 mg by mouth at bedtime.     ipratropium (ATROVENT ) 0.03 % nasal spray Place 2 sprays into both nostrils 2 (two) times daily as needed for rhinitis.     lansoprazole (PREVACID) 30 MG capsule Take 30 mg by mouth daily.     levocetirizine (XYZAL ) 5 MG tablet Take 5 mg by mouth daily.     levothyroxine  (SYNTHROID , LEVOTHROID) 75 MCG tablet Take 1 tablet (75 mcg total) by mouth daily. (Patient taking  differently: Take 25 mcg by mouth daily.) 90 tablet 3   MAGNESIUM  OXIDE PO Take 500 mg by mouth daily.     nitroGLYCERIN  (NITROSTAT ) 0.4 MG SL tablet Place 1 tablet (0.4 mg total) under the tongue every 5 (five) minutes as needed. 10 tablet 0   OZEMPIC, 2 MG/DOSE, 8 MG/3ML SOPN Inject 2 mg into the skin once a week.  potassium chloride  SA (KLOR-CON  M) 20 MEQ tablet Take 20 mEq by mouth daily.     rosuvastatin  (CRESTOR ) 20 MG tablet Take 20 mg by mouth daily.     sacubitril-valsartan (ENTRESTO) 97-103 MG Take 1 tablet by mouth 2 (two) times daily.     TURMERIC PO Take 1,000 mg by mouth daily. Complex     furosemide  (LASIX ) 40 MG tablet Take by mouth 2 (two) times daily. (Patient not taking: Reported on 08/31/2024)     No current facility-administered medications for this visit.    (Not in a hospital admission)   Family History  Problem Relation Age of Onset   Dementia Mother    Hypertension Mother    Diabetes Mother    Depression Father    Heart disease Father 46       AMI age 73; CABG.   Prostate cancer Father    Hypertension Father    Cancer Father        prostate cancer   Hypertension Brother    Arthritis Brother    Heart disease Brother 38       AMI   Diabetes Sister    Heart disease Sister 65       mild AMI   Hypertension Sister    Multiple sclerosis Sister    Down syndrome Son    Colon cancer Maternal Aunt 45     Review of Systems:   Review of Systems  Constitutional:  Positive for malaise/fatigue. Negative for chills, fever and weight loss.  Respiratory:  Positive for shortness of breath. Negative for hemoptysis.   Cardiovascular:  Negative for chest pain, palpitations and leg swelling.  Gastrointestinal:  Negative for nausea and vomiting.  Neurological:  Negative for dizziness and headaches.      Physical Exam: BP 123/73   Pulse 78   Resp 20   Ht 5' 7 (1.702 m)   Wt 180 lb (81.6 kg)   SpO2 97% Comment: RA  BMI 28.19 kg/m  Physical  Exam Constitutional:      Appearance: Normal appearance.  HENT:     Head: Normocephalic and atraumatic.  Cardiovascular:     Rate and Rhythm: Normal rate and regular rhythm.     Heart sounds: No murmur heard. Pulmonary:     Effort: Pulmonary effort is normal. No respiratory distress.  Abdominal:     General: There is no distension.     Palpations: Abdomen is soft.     Tenderness: There is no abdominal tenderness.  Musculoskeletal:        General: No swelling.     Comments: No varicose veins.  No history of vein stripping or ablation.  Skin:    General: Skin is warm and dry.  Neurological:     Mental Status: She is alert and oriented to person, place, and time.       Diagnostic Studies & Laboratory data: Cardiac Studies & Procedures   ______________________________________________________________________________________________ CARDIAC CATHETERIZATION  CARDIAC CATHETERIZATION 08/14/2024  Conclusion Images from the original result were not included.    Prox RCA lesion is 100% stenosed. Dist RCA lesion is 100% stenosed. RPAV-1 lesion is 90% stenosed. RPAV-2 lesion is 95% stenosed.   Prox LAD to Mid LAD lesion is 30% stenosed.   Previously placed mid LAD bare-metal stent is 80% s in-stent restenosis tenosed.   LV end diastolic pressure is moderately elevated.  There is no aortic valve stenosis.  Dominance: Right  Severe two-vessel disease: 100% proximal RCA CTO with faint filling  of the PL system from the LCx and RPDA from the septal perforators 30% proximal LAD leading to D1, between D1 and D2 there is the previously placed stent with 80% ISR. The rest the LAD is relatively free of disease after D2 and wraps the apex filling the entire apex. Small, nondominant LCx with proximal 40% stenosis, and large caliber bifurcating Ramus Intermedius with minimal disease. Moderately elevated LVEDP   RECOMMENDATIONS Plan to review images with CVTS to confirm whether the distal right  system (RPDA) would be a target for CABG, otherwise we will plan for staged DEB PCI of the LAD ISR on Friday, 08/18/2024 Pending decision from CVTS, will hold off on loading with antiplatelet agent until the day of procedure.     [name], MD  Findings Coronary Findings Diagnostic  Dominance: Right  Left Anterior Descending Prox LAD to Mid LAD lesion is 30% stenosed. Mid LAD lesion is 80% stenosed. The lesion is focal, concentric and irregular. The lesion was previously treated using a bare metal stent over 2 years ago. Previously placed stent displays restenosis.  Third Diagonal Branch Vessel is small in size.  Ramus Intermedius Vessel is moderate in size.  Lateral Ramus Intermedius Vessel is moderate in size.  Left Circumflex  Left Posterior Atrioventricular Artery Vessel is small in size.  Right Coronary Artery Vessel was injected. Vessel is normal in caliber. Prox RCA lesion is 100% stenosed. Dist RCA lesion is 100% stenosed.  Right Posterior Descending Artery There is mild disease in the vessel. Collaterals RPDA filled by collaterals from 1st Sept.  Collaterals RPDA filled by collaterals from 2nd Sept.  Right Posterior Atrioventricular Artery There is severe disease in the vessel. RPAV-1 lesion is 90% stenosed. RPAV-2 lesion is 95% stenosed.  First Right Posterolateral Branch There is severe disease in the vessel.  Second Right Posterolateral Branch There is severe disease in the vessel. Collaterals 2nd RPL filled by collaterals from Ramus.  Intervention  No interventions have been documented.   STRESS TESTS  MYOCARDIAL PERFUSION IMAGING 02/02/2020  Interpretation Summary  Lexiscan : Electrically negative for ischemia  Myovue scan with moderate defect in the mid/distal anterior; distal lateral; apical walls consistent with scar and possible soft tissue attenuation No ischemia  LVEF 45%  Compared to previous myovue report changes are  new  INtermediate risk scan.   ECHOCARDIOGRAM  ECHOCARDIOGRAM COMPLETE 08/25/2024  Narrative ECHOCARDIOGRAM REPORT    Patient Name:   Anna Noble Date of Exam: 08/25/2024 Medical Rec #:  [id number]    Height:       67.0 in Accession #:    [id number]   Weight:       183.0 lb Date of Birth:  October 19, 1955    BSA:          1.947 m Patient Age:    68 years     BP:           147/74 mmHg Patient Gender: F            HR:           79 bpm. Exam Location:  Outpatient  Procedure: 2D Echo, 3D Echo, Cardiac Doppler and Color Doppler (Both Spectral and Color Flow Doppler were utilized during procedure).  Indications:    CAD native vessel I25. 10 Dyspnea R06.00  History:        Patient has prior history of Echocardiogram examinations, most recent 01/03/2020. CHF, CAD and Previous Myocardial Infarction, Signs/Symptoms:Dyspnea; Risk Factors:Diabetes, Dyslipidemia, Hypertension and Current Smoker.  Sonographer:    Koleen Popper RDCS Referring Phys: 75 DAVID W HARDING   Sonographer Comments: Image acquisition challenging due to respiratory motion. IMPRESSIONS   1. No obvious left ventricular apical thrombus (Definity  contrast was not used). Left ventricular ejection fraction, by estimation, is 40 to 45%. Left ventricular ejection fraction by 3D volume is 47 %. The left ventricle has mildly decreased function. The left ventricle demonstrates regional wall motion abnormalities (see scoring diagram/findings for description). Left ventricular diastolic parameters are consistent with Grade I diastolic dysfunction (impaired relaxation). 2. Right ventricular systolic function is normal. The right ventricular size is normal. 3. Left atrial size was mildly dilated. 4. The mitral valve is normal in structure. Mild mitral valve regurgitation. No evidence of mitral stenosis. 5. The aortic valve is tricuspid. There is mild calcification of the aortic valve. There is mild thickening of the aortic valve.  Aortic valve regurgitation is not visualized. Aortic valve sclerosis is present, with no evidence of aortic valve stenosis. 6. The inferior vena cava is normal in size with greater than 50% respiratory variability, suggesting right atrial pressure of 3 mmHg.  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 Mitral insufficiency is less severe. Left ventricular filling pressures are lower.  FINDINGS Left Ventricle: No obvious left ventricular apical thrombus (Definity  contrast was not used). Left ventricular ejection fraction, by estimation, is 40 to 45%. Left ventricular ejection fraction by 3D volume is 47 %. The left ventricle has mildly decreased function. The left ventricle demonstrates regional wall motion abnormalities. The left ventricular internal cavity size was normal in size. There is no left ventricular hypertrophy. Left ventricular diastolic parameters are consistent with Grade I diastolic dysfunction (impaired relaxation). Normal left ventricular filling pressure.   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  Right Ventricle: The right ventricular size is normal. Right vetricular wall thickness was not well visualized. Right ventricular systolic function is normal.  Left Atrium: Left atrial size was mildly dilated.  Right Atrium: Right atrial size was normal in size.  Pericardium: There is no evidence of pericardial effusion.  Mitral Valve: The mitral valve is normal in structure. Mild mitral valve regurgitation. No evidence of mitral valve stenosis.  Tricuspid Valve: The tricuspid valve is normal in structure. Tricuspid valve regurgitation is not demonstrated.  Aortic Valve: The aortic valve is tricuspid. There is mild calcification of the aortic valve. There is mild thickening of the aortic valve. Aortic valve regurgitation is not visualized. Aortic valve sclerosis is  present, with no evidence of aortic valve stenosis.  Pulmonic Valve: The pulmonic valve was not well visualized. Pulmonic valve regurgitation is not visualized. No evidence of pulmonic stenosis.  Aorta: The aortic root and ascending aorta are structurally normal, with no evidence of dilitation.  Venous: The inferior vena cava is normal in size with greater than 50% respiratory variability, suggesting right atrial pressure of 3 mmHg.  IAS/Shunts: No atrial level shunt detected by color flow Doppler.  Additional Comments: 3D was performed not requiring image post processing on an independent workstation and was abnormal.   LEFT VENTRICLE PLAX 2D LVIDd:         4.90 cm         Diastology LVIDs:         3.50 cm         LV e' medial:    4.21 cm/s LV PW:  1.10 cm         LV E/e' medial:  9.4 LV IVS:        1.60 cm         LV e' lateral:   6.57 cm/s LVOT diam:     2.10 cm         LV E/e' lateral: 6.0 LV SV:         50 LV SV Index:   26 LVOT Area:     3.46 cm        3D Volume EF LV 3D EF:    Left ventricul LV Volumes (MOD)                            ar LV vol d, MOD    109.0 ml                   ejection A4C:                                        fraction LV vol s, MOD    59.3 ml                    by 3D A4C:                                        volume is LV SV MOD A4C:   109.0 ml                   47 %.  3D Volume EF: 3D EF:        47 % LV EDV:       92 ml LV ESV:       49 ml LV SV:        43 ml  RIGHT VENTRICLE             IVC RV Basal diam:  3.60 cm     IVC diam: 1.10 cm RV S prime:     10.30 cm/s TAPSE (M-mode): 1.8 cm  LEFT ATRIUM             Index        RIGHT ATRIUM          Index LA diam:        3.80 cm 1.95 cm/m   RA Area:     7.79 cm LA Vol (A2C):   30.0 ml 15.41 ml/m  RA Volume:   14.40 ml 7.39 ml/m LA Vol (A4C):   50.7 ml 26.04 ml/m LA Biplane Vol: 41.1 ml 21.11 ml/m AORTIC VALVE LVOT Vmax:   71.60 cm/s LVOT Vmean:  47.200 cm/s LVOT VTI:     0.144 m  AORTA Ao Root diam: 2.80 cm Ao Asc diam:  2.50 cm  MITRAL VALVE MV Area (PHT): 3.21 cm    SHUNTS MV Decel Time: 236 msec    Systemic VTI:  0.14 m MV E velocity: 39.70 cm/s  Systemic Diam: 2.10 cm MV A velocity: 76.40 cm/s MV E/A ratio:  0.52  Mihai Croitoru MD Electronically signed by Jerel Balding MD Signature Date/Time: 08/25/2024/2:58:44 PM    Final          ______________________________________________________________________________________________     EKG: NSR I have  independently reviewed the above radiologic studies and discussed with the patient   Recent Lab Findings: Lab Results  Component Value Date   WBC 8.7 08/15/2024   HGB 13.8 08/15/2024   HCT 42.3 08/15/2024   PLT 216 08/15/2024   GLUCOSE 139 (H) 08/15/2024   CHOL 156 01/19/2018   TRIG 250 (H) 01/19/2018   HDL 45 01/19/2018   LDLCALC 61 01/19/2018   ALT 6 08/07/2024   AST 14 08/07/2024   NA 138 08/15/2024   K 4.7 08/15/2024   CL 99 08/15/2024   CREATININE 1.05 (H) 08/15/2024   BUN 16 08/15/2024   CO2 24 08/15/2024   TSH 0.944 01/03/2020   INR 1.0 01/02/2020   HGBA1C 7.2 01/19/2018      Assessment / Plan:   Ms. Wickey is a pleasant 68 year old woman who presents with multivessel coronary artery disease in the setting of worsening fatigue and increased dyspnea on exertion.  She is active and independent in all of her ADLs.  She has reduced EF (40-45%) and a nonviable apex but the remainder of her LAD distribution including the anterior wall appears viable.  She is a former smoker and a daily marijuana user.  She will need to hold Plavix , Farxiga, Ozempic, and Entresto prior to surgery.  I discussed the general nature of the procedure, including the need for general anesthesia, the incisions to be used, the use of cardiopulmonary bypass, and the use of temporary pacemaker wires and drainage tubes postoperatively with her and her son.  We discussed the expected hospital stay,  overall recovery and short and long term outcomes. I informed them of the indications, risks, benefits and alternatives.   She understands the risks include, but are not limited to death, stroke, MI, DVT/PE, bleeding, possible need for transfusion, infections, cardiac arrhythmias, as well as other organ system dysfunction including respiratory (eg: prolonged ventilation), renal, or GI complications.   Proceed with CABG w/ vein on 12/16.  She will need PFTs prior to surgery given her longstanding smoking history.  I  spent 45 minutes counseling the patient face to face.   Con RAMAN Johnathon Olden 09/01/2024 8:28 AM

## 2024-08-31 NOTE — H&P (View-Only) (Signed)
 [address] [place] [address]             [phone]    Anna Noble [facility] Medical Record #[id number] Date of Birth: Jan 25, 1956  Referring: Anner Alm ORN, MD Primary Care: Waddell Palma, PA-C Primary Cardiologist:John CHRISTELLA Blare, MD  Chief Complaint:    Chief Complaint  Patient presents with   Coronary Artery Disease    Surgical consult/ Cardiac Cath 10/15/23/ MR Cardiac 04/12/24/ ECHO 08/25/24    History of Present Illness:     Anna Noble is a 68 y.o. female who presents for surgical evaluation of multivessel coronary artery disease.  Anna Noble has a history of hypertension hyperlipidemia and diabetes.  Anna Noble had a heart attack in 2001 at which time a stent was placed in the LAD.  Anna Noble is having worsening shortness of breath especially with exertion.  Anna Noble has also felt fatigue.  Anna Noble has no chest pain.  Anna Noble does check Anna Noble glucose at home and it is usually in the 130s.  Anna Noble used to enjoy knitting, crochet, and crafts, but recently Anna Noble does not have the energy to do so.  Anna Noble can climb a flight of stairs but feels short of breath at the top.  Anna Noble lives with Anna Noble 2 sons and their father in a house.  Anna Noble is able to climb a flight of stairs in the house but it is progressively getting harder.  Anna Noble is a former smoker who quit 1 year ago.  Prior to that Anna Noble was smoking 1 pack/day and started at 68 years old (approx 45 pack years).  Anna Noble has very little alcohol use but does smoke marijuana every night to help with restless leg.  Anna Noble denies any other illicit drug use.  BG at home usually 130s.  LHC (08/14/24): RCA CTO with faint filling of PL from L-R collaterals.  LAD stent with 80% ISR. TTE (08/25/24): EF 40-45%, RV normal.  Mild MR, no MS, no AI, no AS. Apex appears aneurysmal cMRI (04/12/24):  Apex non-viable, but viability of mid to apical anterior wall. Chest CT (01/02/20): No significant aortic calcium .  Proximal calcium  of the LAD noted. PFT (N/A):  TBD  Past Medical and Surgical History: Previous Chest Surgery: No Previous Chest Radiation: No Diabetes Mellitus: yes.  HbA1C 7.2% (January 19, 2018) Anticoagulation: Plavix    Creatinine:  Lab Results  Component Value Date   CREATININE 1.05 (H) 08/15/2024   CREATININE 1.11 (H) 08/07/2024   CREATININE 0.72 02/06/2021     Past Medical History:  Diagnosis Date   Abnormal Pap smear of cervix    ascus hpv hr+   Acute myocardial infarction of other anterior wall, episode of care unspecified    Allergy    Arthritis    Coronary atherosclerosis of native coronary artery    Depression    DM (diabetes mellitus) (HCC)    Dysmetabolic syndrome X    HLD (hyperlipidemia)    HTN (hypertension)    Hypothyroidism    Neuromuscular disorder (HCC)    Obesity, unspecified    Tobacco use disorder     Past Surgical History:  Procedure Laterality Date   ACHILLES TENDON SURGERY Left    CARDIAC CATHETERIZATION     Cardiac stenting     EYE SURGERY     HAND SURGERY Right    ganglion cyst, bone graft   KNEE ARTHROSCOPY Right 1976   LEFT HEART CATH AND CORONARY ANGIOGRAPHY N/A 08/14/2024  Procedure: LEFT HEART CATH AND CORONARY ANGIOGRAPHY;  Surgeon: Anner Alm ORN, MD;  Location: [facility] INVASIVE CV LAB;  Service: Cardiovascular;  Laterality: N/A;   STRABISMUS SURGERY Left 1959   TOE SURGERY Right    x 3   TUBAL LIGATION      Social History:  Social History   Tobacco Use  Smoking Status Some Days   Current packs/day: 0.50   Average packs/day: 0.5 packs/day for 30.0 years (15.0 ttl pk-yrs)   Types: E-cigarettes, Cigarettes  Smokeless Tobacco Current  Tobacco Comments   Pt stated Anna Noble smokes an e-cigarette some days.    Social History   Substance and Sexual Activity  Alcohol Use Not Currently   Comment: once a month-beer     Allergies  Allergen Reactions   Penicillins Anaphylaxis   Aspirin  Hives   Nsaids Hives   Latex Rash    Medications: Asprin: No Statin: Yes Beta  Blocker: Yes  Ace Inhibitor: Yes Anti-Coagulation: Yes (Plavix )  Current Outpatient Medications  Medication Sig Dispense Refill   ACCU-CHEK AVIVA PLUS test strip SMARTSIG:1 Strip(s) Via Meter Every Morning     Accu-Chek Softclix Lancets lancets SMARTSIG:Topical     albuterol  (PROVENTIL  HFA;VENTOLIN  HFA) 108 (90 Base) MCG/ACT inhaler Inhale 2 puffs into the lungs every 4 (four) hours as needed for wheezing or shortness of breath (cough, shortness of breath or wheezing.). 1 Inhaler 0   ascorbic acid (VITAMIN C) 500 MG tablet Take 500 mg by mouth daily.     bisoprolol-hydrochlorothiazide  (ZIAC) 10-6.25 MG tablet Take 1 tablet by mouth daily.     Blood Glucose Monitoring Suppl (ACCU-CHEK AVIVA PLUS) w/Device KIT Use 1 kit as directed once a day  to check fasting blood sugar for diabetes     CINNAMON PO Take 1,000 mg by mouth daily.     clopidogrel  (PLAVIX ) 75 MG tablet Take 1 tablet (75 mg total) by mouth daily. Per Dr Sherrlyn take Plavix  until 08/27/24 then discontinue     cyanocobalamin (VITAMIN B12) 1000 MCG tablet Take 1,000 mcg by mouth daily.     cyclobenzaprine  (FLEXERIL ) 5 MG tablet Take 5 mg by mouth at bedtime.     dapagliflozin propanediol (FARXIGA) 10 MG TABS tablet Take 10 mg by mouth daily.     doxylamine, Sleep, (UNISOM) 25 MG tablet Take 25 mg by mouth at bedtime.     DULoxetine  (CYMBALTA ) 60 MG capsule Take 60 mg by mouth daily.     ergocalciferol  (VITAMIN D2) 1.25 MG (50000 UT) capsule Take 50,000 Units by mouth once a week.     gabapentin  (NEURONTIN ) 600 MG tablet Take 600 mg by mouth at bedtime.     ipratropium (ATROVENT ) 0.03 % nasal spray Place 2 sprays into both nostrils 2 (two) times daily as needed for rhinitis.     lansoprazole (PREVACID) 30 MG capsule Take 30 mg by mouth daily.     levocetirizine (XYZAL) 5 MG tablet Take 5 mg by mouth daily.     levothyroxine  (SYNTHROID , LEVOTHROID) 75 MCG tablet Take 1 tablet (75 mcg total) by mouth daily. (Patient taking  differently: Take 25 mcg by mouth daily.) 90 tablet 3   MAGNESIUM OXIDE PO Take 500 mg by mouth daily.     nitroGLYCERIN  (NITROSTAT ) 0.4 MG SL tablet Place 1 tablet (0.4 mg total) under the tongue every 5 (five) minutes as needed. 10 tablet 0   OZEMPIC, 2 MG/DOSE, 8 MG/3ML SOPN Inject 2 mg into the skin once a week.  potassium chloride  SA (KLOR-CON  M) 20 MEQ tablet Take 20 mEq by mouth daily.     rosuvastatin (CRESTOR) 20 MG tablet Take 20 mg by mouth daily.     sacubitril-valsartan (ENTRESTO) 97-103 MG Take 1 tablet by mouth 2 (two) times daily.     TURMERIC PO Take 1,000 mg by mouth daily. Complex     furosemide  (LASIX ) 40 MG tablet Take by mouth 2 (two) times daily. (Patient not taking: Reported on 08/31/2024)     No current facility-administered medications for this visit.    (Not in a hospital admission)   Family History  Problem Relation Age of Onset   Dementia Mother    Hypertension Mother    Diabetes Mother    Depression Father    Heart disease Father 51       AMI age 63; CABG.   Prostate cancer Father    Hypertension Father    Cancer Father        prostate cancer   Hypertension Brother    Arthritis Brother    Heart disease Brother 42       AMI   Diabetes Sister    Heart disease Sister 43       mild AMI   Hypertension Sister    Multiple sclerosis Sister    Down syndrome Son    Colon cancer Maternal Aunt 45     Review of Systems:   Review of Systems  Constitutional:  Positive for malaise/fatigue. Negative for chills, fever and weight loss.  Respiratory:  Positive for shortness of breath. Negative for hemoptysis.   Cardiovascular:  Negative for chest pain, palpitations and leg swelling.  Gastrointestinal:  Negative for nausea and vomiting.  Neurological:  Negative for dizziness and headaches.      Physical Exam: BP 123/73   Pulse 78   Resp 20   Ht 5' 7 (1.702 m)   Wt 180 lb (81.6 kg)   SpO2 97% Comment: RA  BMI 28.19 kg/m  Physical  Exam Constitutional:      Appearance: Normal appearance.  HENT:     Head: Normocephalic and atraumatic.  Cardiovascular:     Rate and Rhythm: Normal rate and regular rhythm.     Heart sounds: No murmur heard. Pulmonary:     Effort: Pulmonary effort is normal. No respiratory distress.  Abdominal:     General: There is no distension.     Palpations: Abdomen is soft.     Tenderness: There is no abdominal tenderness.  Musculoskeletal:        General: No swelling.     Comments: No varicose veins.  No history of vein stripping or ablation.  Skin:    General: Skin is warm and dry.  Neurological:     Mental Status: Anna Noble is alert and oriented to person, place, and time.       Diagnostic Studies & Laboratory data: Cardiac Studies & Procedures   ______________________________________________________________________________________________ CARDIAC CATHETERIZATION  CARDIAC CATHETERIZATION 08/14/2024  Conclusion Images from the original result were not included.    Prox RCA lesion is 100% stenosed. Dist RCA lesion is 100% stenosed. RPAV-1 lesion is 90% stenosed. RPAV-2 lesion is 95% stenosed.   Prox LAD to Mid LAD lesion is 30% stenosed.   Previously placed mid LAD bare-metal stent is 80% s in-stent restenosis tenosed.   LV end diastolic pressure is moderately elevated.  There is no aortic valve stenosis.  Dominance: Right  Severe two-vessel disease: 100% proximal RCA CTO with faint filling  of the PL system from the LCx and RPDA from the septal perforators 30% proximal LAD leading to D1, between D1 and D2 there is the previously placed stent with 80% ISR. The rest the LAD is relatively free of disease after D2 and wraps the apex filling the entire apex. Small, nondominant LCx with proximal 40% stenosis, and large caliber bifurcating Ramus Intermedius with minimal disease. Moderately elevated LVEDP   RECOMMENDATIONS Plan to review images with CVTS to confirm whether the distal right  system (RPDA) would be a target for CABG, otherwise we will plan for staged DEB PCI of the LAD ISR on Friday, 08/18/2024 Pending decision from CVTS, will hold off on loading with antiplatelet agent until the day of procedure.     [name], MD  Findings Coronary Findings Diagnostic  Dominance: Right  Left Anterior Descending Prox LAD to Mid LAD lesion is 30% stenosed. Mid LAD lesion is 80% stenosed. The lesion is focal, concentric and irregular. The lesion was previously treated using a bare metal stent over 2 years ago. Previously placed stent displays restenosis.  Third Diagonal Branch Vessel is small in size.  Ramus Intermedius Vessel is moderate in size.  Lateral Ramus Intermedius Vessel is moderate in size.  Left Circumflex  Left Posterior Atrioventricular Artery Vessel is small in size.  Right Coronary Artery Vessel was injected. Vessel is normal in caliber. Prox RCA lesion is 100% stenosed. Dist RCA lesion is 100% stenosed.  Right Posterior Descending Artery There is mild disease in the vessel. Collaterals RPDA filled by collaterals from 1st Sept.  Collaterals RPDA filled by collaterals from 2nd Sept.  Right Posterior Atrioventricular Artery There is severe disease in the vessel. RPAV-1 lesion is 90% stenosed. RPAV-2 lesion is 95% stenosed.  First Right Posterolateral Branch There is severe disease in the vessel.  Second Right Posterolateral Branch There is severe disease in the vessel. Collaterals 2nd RPL filled by collaterals from Ramus.  Intervention  No interventions have been documented.   STRESS TESTS  MYOCARDIAL PERFUSION IMAGING 02/02/2020  Interpretation Summary  Lexiscan : Electrically negative for ischemia  Myovue scan with moderate defect in the mid/distal anterior; distal lateral; apical walls consistent with scar and possible soft tissue attenuation No ischemia  LVEF 45%  Compared to previous myovue report changes are  new  INtermediate risk scan.   ECHOCARDIOGRAM  ECHOCARDIOGRAM COMPLETE 08/25/2024  Narrative ECHOCARDIOGRAM REPORT    Patient Name:   Anna Noble Date of Exam: 08/25/2024 Medical Rec #:  [id number]    Height:       67.0 in Accession #:    [id number]   Weight:       183.0 lb Date of Birth:  May 27, 1956    BSA:          1.947 m Patient Age:    68 years     BP:           147/74 mmHg Patient Gender: F            HR:           79 bpm. Exam Location:  Outpatient  Procedure: 2D Echo, 3D Echo, Cardiac Doppler and Color Doppler (Both Spectral and Color Flow Doppler were utilized during procedure).  Indications:    CAD native vessel I25. 10 Dyspnea R06.00  History:        Patient has prior history of Echocardiogram examinations, most recent 01/03/2020. CHF, CAD and Previous Myocardial Infarction, Signs/Symptoms:Dyspnea; Risk Factors:Diabetes, Dyslipidemia, Hypertension and Current Smoker.  Sonographer:    Koleen Popper RDCS Referring Phys: 48 DAVID W HARDING   Sonographer Comments: Image acquisition challenging due to respiratory motion. IMPRESSIONS   1. No obvious left ventricular apical thrombus (Definity  contrast was not used). Left ventricular ejection fraction, by estimation, is 40 to 45%. Left ventricular ejection fraction by 3D volume is 47 %. The left ventricle has mildly decreased function. The left ventricle demonstrates regional wall motion abnormalities (see scoring diagram/findings for description). Left ventricular diastolic parameters are consistent with Grade I diastolic dysfunction (impaired relaxation). 2. Right ventricular systolic function is normal. The right ventricular size is normal. 3. Left atrial size was mildly dilated. 4. The mitral valve is normal in structure. Mild mitral valve regurgitation. No evidence of mitral stenosis. 5. The aortic valve is tricuspid. There is mild calcification of the aortic valve. There is mild thickening of the aortic valve.  Aortic valve regurgitation is not visualized. Aortic valve sclerosis is present, with no evidence of aortic valve stenosis. 6. The inferior vena cava is normal in size with greater than 50% respiratory variability, suggesting right atrial pressure of 3 mmHg.  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 Mitral insufficiency is less severe. Left ventricular filling pressures are lower.  FINDINGS Left Ventricle: No obvious left ventricular apical thrombus (Definity  contrast was not used). Left ventricular ejection fraction, by estimation, is 40 to 45%. Left ventricular ejection fraction by 3D volume is 47 %. The left ventricle has mildly decreased function. The left ventricle demonstrates regional wall motion abnormalities. The left ventricular internal cavity size was normal in size. There is no left ventricular hypertrophy. Left ventricular diastolic parameters are consistent with Grade I diastolic dysfunction (impaired relaxation). Normal left ventricular filling pressure.   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  Right Ventricle: The right ventricular size is normal. Right vetricular wall thickness was not well visualized. Right ventricular systolic function is normal.  Left Atrium: Left atrial size was mildly dilated.  Right Atrium: Right atrial size was normal in size.  Pericardium: There is no evidence of pericardial effusion.  Mitral Valve: The mitral valve is normal in structure. Mild mitral valve regurgitation. No evidence of mitral valve stenosis.  Tricuspid Valve: The tricuspid valve is normal in structure. Tricuspid valve regurgitation is not demonstrated.  Aortic Valve: The aortic valve is tricuspid. There is mild calcification of the aortic valve. There is mild thickening of the aortic valve. Aortic valve regurgitation is not visualized. Aortic valve sclerosis is  present, with no evidence of aortic valve stenosis.  Pulmonic Valve: The pulmonic valve was not well visualized. Pulmonic valve regurgitation is not visualized. No evidence of pulmonic stenosis.  Aorta: The aortic root and ascending aorta are structurally normal, with no evidence of dilitation.  Venous: The inferior vena cava is normal in size with greater than 50% respiratory variability, suggesting right atrial pressure of 3 mmHg.  IAS/Shunts: No atrial level shunt detected by color flow Doppler.  Additional Comments: 3D was performed not requiring image post processing on an independent workstation and was abnormal.   LEFT VENTRICLE PLAX 2D LVIDd:         4.90 cm         Diastology LVIDs:         3.50 cm         LV e' medial:    4.21 cm/s LV PW:  1.10 cm         LV E/e' medial:  9.4 LV IVS:        1.60 cm         LV e' lateral:   6.57 cm/s LVOT diam:     2.10 cm         LV E/e' lateral: 6.0 LV SV:         50 LV SV Index:   26 LVOT Area:     3.46 cm        3D Volume EF LV 3D EF:    Left ventricul LV Volumes (MOD)                            ar LV vol d, MOD    109.0 ml                   ejection A4C:                                        fraction LV vol s, MOD    59.3 ml                    by 3D A4C:                                        volume is LV SV MOD A4C:   109.0 ml                   47 %.  3D Volume EF: 3D EF:        47 % LV EDV:       92 ml LV ESV:       49 ml LV SV:        43 ml  RIGHT VENTRICLE             IVC RV Basal diam:  3.60 cm     IVC diam: 1.10 cm RV S prime:     10.30 cm/s TAPSE (M-mode): 1.8 cm  LEFT ATRIUM             Index        RIGHT ATRIUM          Index LA diam:        3.80 cm 1.95 cm/m   RA Area:     7.79 cm LA Vol (A2C):   30.0 ml 15.41 ml/m  RA Volume:   14.40 ml 7.39 ml/m LA Vol (A4C):   50.7 ml 26.04 ml/m LA Biplane Vol: 41.1 ml 21.11 ml/m AORTIC VALVE LVOT Vmax:   71.60 cm/s LVOT Vmean:  47.200 cm/s LVOT VTI:     0.144 m  AORTA Ao Root diam: 2.80 cm Ao Asc diam:  2.50 cm  MITRAL VALVE MV Area (PHT): 3.21 cm    SHUNTS MV Decel Time: 236 msec    Systemic VTI:  0.14 m MV E velocity: 39.70 cm/s  Systemic Diam: 2.10 cm MV A velocity: 76.40 cm/s MV E/A ratio:  0.52  Mihai Croitoru MD Electronically signed by Jerel Balding MD Signature Date/Time: 08/25/2024/2:58:44 PM    Final          ______________________________________________________________________________________________     EKG: NSR I have  independently reviewed the above radiologic studies and discussed with the patient   Recent Lab Findings: Lab Results  Component Value Date   WBC 8.7 08/15/2024   HGB 13.8 08/15/2024   HCT 42.3 08/15/2024   PLT 216 08/15/2024   GLUCOSE 139 (H) 08/15/2024   CHOL 156 01/19/2018   TRIG 250 (H) 01/19/2018   HDL 45 01/19/2018   LDLCALC 61 01/19/2018   ALT 6 08/07/2024   AST 14 08/07/2024   NA 138 08/15/2024   K 4.7 08/15/2024   CL 99 08/15/2024   CREATININE 1.05 (H) 08/15/2024   BUN 16 08/15/2024   CO2 24 08/15/2024   TSH 0.944 01/03/2020   INR 1.0 01/02/2020   HGBA1C 7.2 01/19/2018      Assessment / Plan:   Anna Noble is a pleasant 68 year old woman who presents with multivessel coronary artery disease in the setting of worsening fatigue and increased dyspnea on exertion.  Anna Noble is active and independent in all of Anna Noble ADLs.  Anna Noble has reduced EF (40-45%) and a nonviable apex but the remainder of Anna Noble LAD distribution including the anterior wall appears viable.  Anna Noble is a former smoker and a daily marijuana user.  Anna Noble will need to hold Plavix , Farxiga, Ozempic, and Entresto prior to surgery.  I discussed the general nature of the procedure, including the need for general anesthesia, the incisions to be used, the use of cardiopulmonary bypass, and the use of temporary pacemaker wires and drainage tubes postoperatively with Anna Noble and Anna Noble son.  We discussed the expected hospital stay,  overall recovery and short and long term outcomes. I informed them of the indications, risks, benefits and alternatives.   Anna Noble understands the risks include, but are not limited to death, stroke, MI, DVT/PE, bleeding, possible need for transfusion, infections, cardiac arrhythmias, as well as other organ system dysfunction including respiratory (eg: prolonged ventilation), renal, or GI complications.   Proceed with CABG w/ vein on 12/16.  Anna Noble will need PFTs prior to surgery given Anna Noble longstanding smoking history.  I  spent 45 minutes counseling the patient face to face.   Con RAMAN Adnan Vanvoorhis 09/01/2024 8:28 AM

## 2024-09-05 ENCOUNTER — Inpatient Hospital Stay (HOSPITAL_COMMUNITY): Admission: RE | Admit: 2024-09-05 | Discharge: 2024-09-05

## 2024-09-05 ENCOUNTER — Other Ambulatory Visit (HOSPITAL_COMMUNITY)

## 2024-09-05 DIAGNOSIS — I25119 Atherosclerotic heart disease of native coronary artery with unspecified angina pectoris: Secondary | ICD-10-CM

## 2024-09-05 LAB — PULMONARY FUNCTION TEST
DL/VA % pred: 80 %
DL/VA: 3.28 ml/min/mmHg/L
DLCO cor % pred: 66 %
DLCO cor: 14.45 ml/min/mmHg
DLCO unc % pred: 67 %
DLCO unc: 14.62 ml/min/mmHg
FEF 25-75 Post: 2.09 L/s
FEF 25-75 Pre: 1.75 L/s
FEF2575-%Change-Post: 19 %
FEF2575-%Pred-Post: 98 %
FEF2575-%Pred-Pre: 82 %
FEV1-%Change-Post: 4 %
FEV1-%Pred-Post: 86 %
FEV1-%Pred-Pre: 82 %
FEV1-Post: 2.24 L
FEV1-Pre: 2.14 L
FEV1FVC-%Change-Post: 2 %
FEV1FVC-%Pred-Pre: 99 %
FEV6-%Change-Post: 1 %
FEV6-%Pred-Post: 86 %
FEV6-%Pred-Pre: 85 %
FEV6-Post: 2.83 L
FEV6-Pre: 2.79 L
FEV6FVC-%Change-Post: -1 %
FEV6FVC-%Pred-Post: 102 %
FEV6FVC-%Pred-Pre: 103 %
FVC-%Change-Post: 2 %
FVC-%Pred-Post: 84 %
FVC-%Pred-Pre: 82 %
FVC-Post: 2.88 L
FVC-Pre: 2.82 L
Post FEV1/FVC ratio: 78 %
Post FEV6/FVC ratio: 98 %
Pre FEV1/FVC ratio: 76 %
Pre FEV6/FVC Ratio: 99 %
RV % pred: 89 %
RV: 2.07 L
TLC % pred: 88 %
TLC: 4.9 L

## 2024-09-05 MED ORDER — ALBUTEROL SULFATE (2.5 MG/3ML) 0.083% IN NEBU
2.5000 mg | INHALATION_SOLUTION | Freq: Once | RESPIRATORY_TRACT | Status: AC
  Administered 2024-09-05: 2.5 mg via RESPIRATORY_TRACT

## 2024-09-07 ENCOUNTER — Ambulatory Visit: Admitting: Cardiology

## 2024-09-07 ENCOUNTER — Encounter (HOSPITAL_COMMUNITY): Payer: Self-pay

## 2024-09-07 NOTE — Pre-Procedure Instructions (Signed)
 Surgical Instructions   Your procedure is scheduled on September 12, 2024. Report to [facility] Main Entrance A at 5:30 A.M., then check in with the Admitting office. Any questions or running late day of surgery: call [phone]  Questions prior to your surgery date: call [phone], Monday-Friday, 8am-4pm. If you experience any cold or flu symptoms such as cough, fever, chills, shortness of breath, etc. between now and your scheduled surgery, please notify us  at the above number.     Remember:  Do not eat or drink after midnight the night before your surgery   Take these medicines the morning of surgery with A SIP OF WATER : DULoxetine  (CYMBALTA )  lansoprazole (PREVACID)  levocetirizine (XYZAL)  rosuvastatin (CRESTOR)    May take these medicines IF NEEDED: albuterol  (PROVENTIL  HFA;VENTOLIN  HFA) inhaler - please bring inhaler with you morning of surgery ipratropium (ATROVENT ) nasal spray  nitroGLYCERIN  (NITROSTAT ) - if dose taken prior to surgery, please call [phone]    STOP taking your clopidogrel  (PLAVIX ) five days prior to surgery. Your last dose will be December 10th.  STOP taking your dapagliflozin propanediol (FARXIGA) and sacubitril-valsartan (ENTRESTO) three days prior to surgery. Your last doses will be December 12th.   One week prior to surgery, STOP taking any Aspirin  (unless otherwise instructed by your surgeon) Aleve, Naproxen, Ibuprofen, Motrin, Advil, Goody's, BC's, all herbal medications, fish oil, and non-prescription vitamins.   WHAT DO I DO ABOUT MY DIABETES MEDICATION?   STOP taking your OZEMPIC one week prior to surgery. DO NOT take any doses after December 8th.   HOW TO MANAGE YOUR DIABETES BEFORE AND AFTER SURGERY  Why is it important to control my blood sugar before and after surgery? Improving blood sugar levels before and after surgery helps healing and can limit problems. A way of improving blood sugar control is eating a healthy  diet by:  Eating less sugar and carbohydrates  Increasing activity/exercise  Talking with your doctor about reaching your blood sugar goals High blood sugars (greater than 180 mg/dL) can raise your risk of infections and slow your recovery, so you will need to focus on controlling your diabetes during the weeks before surgery. Make sure that the doctor who takes care of your diabetes knows about your planned surgery including the date and location.  How do I manage my blood sugar before surgery? Check your blood sugar at least 4 times a day, starting 2 days before surgery, to make sure that the level is not too high or low.  Check your blood sugar the morning of your surgery when you wake up and every 2 hours until you get to the Short Stay unit.  If your blood sugar is less than 70 mg/dL, you will need to treat for low blood sugar: Do not take insulin . Treat a low blood sugar (less than 70 mg/dL) with  cup of clear juice (cranberry or apple), 4 glucose tablets, OR glucose gel. Recheck blood sugar in 15 minutes after treatment (to make sure it is greater than 70 mg/dL). If your blood sugar is not greater than 70 mg/dL on recheck, call [phone] for further instructions. Report your blood sugar to the short stay nurse when you get to Short Stay.  If you are admitted to the hospital after surgery: Your blood sugar will be checked by the staff and you will probably be given insulin  after surgery (instead of oral diabetes medicines) to make sure you have good blood sugar levels. The goal for blood sugar control after  surgery is 80-180 mg/dL.                      Do NOT Smoke (Tobacco/Vaping) for 24 hours prior to your procedure.  If you use a CPAP at night, you may bring your mask/headgear for your overnight stay.   You will be asked to remove any contacts, glasses, piercing's, hearing aid's, dentures/partials prior to surgery. Please bring cases for these items if needed.    Patients  discharged the day of surgery will not be allowed to drive home, and someone needs to stay with them for 24 hours.  SURGICAL WAITING ROOM VISITATION Patients may have no more than 2 support people in the waiting area - these visitors may rotate.   Pre-op nurse will coordinate an appropriate time for 1 ADULT support person, who may not rotate, to accompany patient in pre-op.  Children under the age of 74 must have an adult with them who is not the patient and must remain in the main waiting area with an adult.  If the patient needs to stay at the hospital during part of their recovery, the visitor guidelines for inpatient rooms apply.  Please refer to the [facility] website for the visitor guidelines for any additional information.   If you received a COVID test during your pre-op visit  it is requested that you wear a mask when out in public, stay away from anyone that may not be feeling well and notify your surgeon if you develop symptoms. If you have been in contact with anyone that has tested positive in the last 10 days please notify you surgeon.      Pre-operative CHG Bathing Instructions   You can play a key role in reducing the risk of infection after surgery. Your skin needs to be as free of germs as possible. You can reduce the number of germs on your skin by washing with CHG (chlorhexidine gluconate) soap before surgery. CHG is an antiseptic soap that kills germs and continues to kill germs even after washing.   DO NOT use if you have an allergy to chlorhexidine/CHG or antibacterial soaps. If your skin becomes reddened or irritated, stop using the CHG and notify one of our RNs at [phone].              TAKE A SHOWER THE NIGHT BEFORE SURGERY   Please keep in mind the following:  DO NOT shave, including legs and underarms, 48 hours prior to surgery.   You may shave your face before/day of surgery.  Place clean sheets on your bed the night before surgery Use a clean washcloth  (not used since being washed) for shower. DO NOT sleep with pet's night before surgery.  CHG Shower Instructions:  Wash your face and private area with normal soap. If you choose to wash your hair, wash first with your normal shampoo.  After you use shampoo/soap, rinse your hair and body thoroughly to remove shampoo/soap residue.  Turn the water  OFF and apply half the bottle of CHG soap to a CLEAN washcloth.  Apply CHG soap ONLY FROM YOUR NECK DOWN TO YOUR TOES (washing for 3-5 minutes)  DO NOT use CHG soap on face, private areas, open wounds, or sores.  Pay special attention to the area where your surgery is being performed.  If you are having back surgery, having someone wash your back for you may be helpful. Wait 2 minutes after CHG soap is applied, then you may  rinse off the CHG soap.  Pat dry with a clean towel  Put on clean pajamas    Additional instructions for the day of surgery: If you choose, you may shower the morning of surgery with an antibacterial soap.  DO NOT APPLY any lotions, deodorants, cologne, or perfumes.   Do not wear jewelry or makeup Do not wear nail polish, gel polish, artificial nails, or any other type of covering on natural nails (fingers and toes) Do not bring valuables to the hospital. [facility] is not responsible for valuables/personal belongings. Put on clean/comfortable clothes.  Please brush your teeth.  Ask your nurse before applying any prescription medications to the skin.

## 2024-09-08 ENCOUNTER — Ambulatory Visit (HOSPITAL_COMMUNITY): Admission: RE | Admit: 2024-09-08 | Discharge: 2024-09-08

## 2024-09-08 ENCOUNTER — Encounter (HOSPITAL_COMMUNITY): Payer: Self-pay

## 2024-09-08 ENCOUNTER — Other Ambulatory Visit: Payer: Self-pay

## 2024-09-08 ENCOUNTER — Ambulatory Visit (HOSPITAL_COMMUNITY): Admission: RE | Admit: 2024-09-08 | Discharge: 2024-09-08 | Disposition: A | Source: Ambulatory Visit

## 2024-09-08 ENCOUNTER — Inpatient Hospital Stay (HOSPITAL_COMMUNITY): Admission: RE | Admit: 2024-09-08 | Discharge: 2024-09-08

## 2024-09-08 VITALS — BP 140/88 | HR 74 | Temp 98.0°F | Resp 20 | Ht 67.0 in | Wt 184.4 lb

## 2024-09-08 DIAGNOSIS — I25119 Atherosclerotic heart disease of native coronary artery with unspecified angina pectoris: Secondary | ICD-10-CM | POA: Diagnosis not present

## 2024-09-08 DIAGNOSIS — E1159 Type 2 diabetes mellitus with other circulatory complications: Secondary | ICD-10-CM

## 2024-09-08 DIAGNOSIS — Z01818 Encounter for other preprocedural examination: Secondary | ICD-10-CM | POA: Diagnosis present

## 2024-09-08 HISTORY — DX: Heart failure, unspecified: I50.9

## 2024-09-08 LAB — URINALYSIS, ROUTINE W REFLEX MICROSCOPIC
Bacteria, UA: NONE SEEN
Bilirubin Urine: NEGATIVE
Glucose, UA: >=500 mg/dL — AB
Hgb urine dipstick: NEGATIVE
Ketones, ur: NEGATIVE mg/dL
Leukocytes,Ua: NEGATIVE
Nitrite: NEGATIVE
Protein, ur: NEGATIVE mg/dL
Specific Gravity, Urine: 1.017 (ref 1.005–1.030)
pH: 6 (ref 5.0–8.0)

## 2024-09-08 LAB — COMPREHENSIVE METABOLIC PANEL WITH GFR
ALT: 9 U/L (ref 0–44)
AST: 22 U/L (ref 15–41)
Albumin: 3.9 g/dL (ref 3.5–5.0)
Alkaline Phosphatase: 118 U/L (ref 38–126)
Anion gap: 10 (ref 5–15)
BUN: 26 mg/dL — ABNORMAL HIGH (ref 8–23)
CO2: 28 mmol/L (ref 22–32)
Calcium: 8.8 mg/dL — ABNORMAL LOW (ref 8.9–10.3)
Chloride: 98 mmol/L (ref 98–111)
Creatinine, Ser: 1.12 mg/dL — ABNORMAL HIGH (ref 0.44–1.00)
GFR, Estimated: 53 mL/min — ABNORMAL LOW (ref >60)
Glucose, Bld: 152 mg/dL — ABNORMAL HIGH (ref 70–99)
Potassium: 4.6 mmol/L (ref 3.5–5.1)
Sodium: 136 mmol/L (ref 135–145)
Total Bilirubin: 0.5 mg/dL (ref 0.0–1.2)
Total Protein: 7.2 g/dL (ref 6.5–8.1)

## 2024-09-08 LAB — SURGICAL PCR SCREEN
MRSA, PCR: NEGATIVE
Staphylococcus aureus: NEGATIVE

## 2024-09-08 LAB — PROTIME-INR
INR: 1 (ref 0.8–1.2)
Prothrombin Time: 13.8 s (ref 11.4–15.2)

## 2024-09-08 LAB — CBC
HCT: 43.4 % (ref 36.0–46.0)
Hemoglobin: 14.1 g/dL (ref 12.0–15.0)
MCH: 30 pg (ref 26.0–34.0)
MCHC: 32.5 g/dL (ref 30.0–36.0)
MCV: 92.3 fL (ref 80.0–100.0)
Platelets: 219 K/uL (ref 150–400)
RBC: 4.7 MIL/uL (ref 3.87–5.11)
RDW: 13.1 % (ref 11.5–15.5)
WBC: 9.1 K/uL (ref 4.0–10.5)
nRBC: 0 % (ref 0.0–0.2)

## 2024-09-08 LAB — VAS US DOPPLER PRE CABG

## 2024-09-08 LAB — HEMOGLOBIN A1C
Hgb A1c MFr Bld: 7.9 % — ABNORMAL HIGH (ref 4.8–5.6)
Mean Plasma Glucose: 180.03 mg/dL

## 2024-09-08 LAB — APTT: aPTT: 30 s (ref 24–36)

## 2024-09-08 LAB — GLUCOSE, CAPILLARY: Glucose-Capillary: 137 mg/dL — ABNORMAL HIGH (ref 70–99)

## 2024-09-08 NOTE — Progress Notes (Signed)
 PCP - Alan Birmingham, PA-C Cardiologist - Dr. Norleen Blare with [facility]  Saw Dr. Anner 08/07/2024 for heart cath  PPM/ICD - denies Device Orders - na Rep Notified - na  Chest x-ray - PAT, 09/08/2024 EKG - PAT, 09/08/2024 Stress Test - 04/12/2024 ECHO - 08/25/2024 Cardiac Cath - 08/14/2024  Sleep Study - denies CPAP - na  Type II diabetic. A1C drawn at PAT appointment.  Blood sugar was 137 at PAT Fasting Blood Sugar : 120-140 Checks Blood Sugar: daily Farxiga: hold 3 days Entresto: hold 3 days  Last dose of GLP1 agonist-  Ozempic GLP1 instructions: hold 1 week, last dose 08/25/2024  Blood Thinner Instructions: Plavix , hold 5 days, states last dose 08/31/2024 Aspirin  Instructions: denies  ERAS Protcol - NPO  Anesthesia review: Yes. CAD, MI, HTN, DM, having a CABG  Patient denies shortness of breath, fever, cough and chest pain at PAT appointment   All instructions explained to the patient, with a verbal understanding of the material. Patient agrees to go over the instructions while at home for a better understanding. Patient also instructed to self quarantine after being tested for COVID-19. The opportunity to ask questions was provided.

## 2024-09-11 ENCOUNTER — Encounter (HOSPITAL_COMMUNITY): Payer: Self-pay

## 2024-09-11 MED ORDER — INSULIN REGULAR(HUMAN) IN NACL 100-0.9 UT/100ML-% IV SOLN
INTRAVENOUS | Status: AC
  Administered 2024-09-12: 08:00:00 4 [IU]/h via INTRAVENOUS
  Filled 2024-09-11: qty 100

## 2024-09-11 MED ORDER — MANNITOL 20 % IV SOLN
INTRAVENOUS | Status: DC
  Filled 2024-09-11: qty 13

## 2024-09-11 MED ORDER — TRANEXAMIC ACID (OHS) BOLUS VIA INFUSION
15.0000 mg/kg | INTRAVENOUS | Status: AC
  Administered 2024-09-12: 08:00:00 1254 mg via INTRAVENOUS
  Filled 2024-09-11: qty 1254

## 2024-09-11 MED ORDER — EPINEPHRINE HCL 5 MG/250ML IV SOLN IN NS
0.0000 ug/min | INTRAVENOUS | Status: DC
  Filled 2024-09-11: qty 250

## 2024-09-11 MED ORDER — NOREPINEPHRINE 4 MG/250ML-% IV SOLN
0.0000 ug/min | INTRAVENOUS | Status: AC
  Administered 2024-09-12: 09:00:00 2 ug/min via INTRAVENOUS
  Filled 2024-09-11: qty 250

## 2024-09-11 MED ORDER — MILRINONE LACTATE IN DEXTROSE 20-5 MG/100ML-% IV SOLN
0.3000 ug/kg/min | INTRAVENOUS | Status: DC
  Filled 2024-09-11: qty 100

## 2024-09-11 MED ORDER — DEXMEDETOMIDINE HCL IN NACL 400 MCG/100ML IV SOLN
0.1000 ug/kg/h | INTRAVENOUS | Status: AC
  Administered 2024-09-12: 08:00:00 .4 ug/kg/h via INTRAVENOUS
  Filled 2024-09-11: qty 100

## 2024-09-11 MED ORDER — VANCOMYCIN HCL 1250 MG/250ML IV SOLN
1250.0000 mg | INTRAVENOUS | Status: AC
  Administered 2024-09-12: 08:00:00 1250 mg via INTRAVENOUS
  Filled 2024-09-11: qty 250

## 2024-09-11 MED ORDER — NITROGLYCERIN IN D5W 200-5 MCG/ML-% IV SOLN
2.0000 ug/min | INTRAVENOUS | Status: DC
  Filled 2024-09-11: qty 250

## 2024-09-11 MED ORDER — PLASMA-LYTE A IV SOLN
INTRAVENOUS | Status: DC
  Filled 2024-09-11: qty 2.5

## 2024-09-11 MED ORDER — TRANEXAMIC ACID (OHS) PUMP PRIME SOLUTION
2.0000 mg/kg | INTRAVENOUS | Status: DC
  Filled 2024-09-11: qty 1.67

## 2024-09-11 MED ORDER — HEPARIN 30,000 UNITS/1000 ML (OHS) CELLSAVER SOLUTION
Status: DC
  Filled 2024-09-11: qty 1000

## 2024-09-11 MED ORDER — TRANEXAMIC ACID 1000 MG/10ML IV SOLN
1.5000 mg/kg/h | INTRAVENOUS | Status: AC
  Administered 2024-09-12: 08:00:00 1.5 mg/kg/h via INTRAVENOUS
  Filled 2024-09-11: qty 25

## 2024-09-11 MED ORDER — CEFAZOLIN SODIUM-DEXTROSE 2-4 GM/100ML-% IV SOLN
2.0000 g | INTRAVENOUS | Status: DC
  Filled 2024-09-11: qty 100

## 2024-09-11 MED ORDER — PHENYLEPHRINE HCL-NACL 20-0.9 MG/250ML-% IV SOLN
30.0000 ug/min | INTRAVENOUS | Status: AC
  Administered 2024-09-12: 09:00:00 10 ug/min via INTRAVENOUS
  Filled 2024-09-11: qty 250

## 2024-09-11 MED ORDER — CEFAZOLIN SODIUM-DEXTROSE 2-4 GM/100ML-% IV SOLN
2.0000 g | INTRAVENOUS | Status: AC
  Administered 2024-09-12 (×2): 2 g via INTRAVENOUS
  Filled 2024-09-11 (×2): qty 100

## 2024-09-11 MED ORDER — POTASSIUM CHLORIDE 2 MEQ/ML IV SOLN
80.0000 meq | INTRAVENOUS | Status: DC
  Filled 2024-09-11: qty 40

## 2024-09-11 NOTE — Anesthesia Preprocedure Evaluation (Signed)
 Anesthesia Evaluation  Patient identified by MRN, date of birth, ID band Patient awake    Reviewed: Allergy & Precautions, H&P , NPO status , Patient's Chart, lab work & pertinent test results  Airway Mallampati: II  TM Distance: >3 FB Neck ROM: Full    Dental no notable dental hx. (+) Edentulous Upper, Edentulous Lower, Dental Advisory Given   Pulmonary Current Smoker   Pulmonary exam normal breath sounds clear to auscultation       Cardiovascular Exercise Tolerance: Good hypertension, Pt. on medications and Pt. on home beta blockers + angina  + CAD, + Past MI, +CHF and + DOE   Rhythm:Regular Rate:Normal     Neuro/Psych    Depression    negative neurological ROS     GI/Hepatic negative GI ROS, Neg liver ROS,,,  Endo/Other  diabetes, Type 2Hypothyroidism    Renal/GU negative Renal ROS  negative genitourinary   Musculoskeletal  (+) Arthritis , Osteoarthritis,  Fibromyalgia -  Abdominal   Peds  Hematology negative hematology ROS (+)   Anesthesia Other Findings   Reproductive/Obstetrics negative OB ROS                              Anesthesia Physical Anesthesia Plan  ASA: 4  Anesthesia Plan: General   Post-op Pain Management: Tylenol  PO (pre-op)*   Induction: Intravenous  PONV Risk Score and Plan: 2 and Midazolam , Ondansetron  and Dexamethasone  Airway Management Planned: Oral ETT  Additional Equipment: Arterial line, CVP, TEE and Ultrasound Guidance Line Placement  Intra-op Plan:   Post-operative Plan: Post-operative intubation/ventilation  Informed Consent: I have reviewed the patients History and Physical, chart, labs and discussed the procedure including the risks, benefits and alternatives for the proposed anesthesia with the patient or authorized representative who has indicated his/her understanding and acceptance.     Dental advisory given  Plan Discussed with:  CRNA  Anesthesia Plan Comments:          Anesthesia Quick Evaluation

## 2024-09-12 ENCOUNTER — Other Ambulatory Visit: Payer: Self-pay | Admitting: Physician Assistant

## 2024-09-12 ENCOUNTER — Inpatient Hospital Stay (HOSPITAL_COMMUNITY)

## 2024-09-12 ENCOUNTER — Inpatient Hospital Stay (HOSPITAL_COMMUNITY): Admitting: Certified Registered Nurse Anesthetist

## 2024-09-12 ENCOUNTER — Other Ambulatory Visit: Payer: Self-pay

## 2024-09-12 ENCOUNTER — Inpatient Hospital Stay: Admission: RE | Disposition: A | Payer: Self-pay

## 2024-09-12 ENCOUNTER — Inpatient Hospital Stay (HOSPITAL_COMMUNITY): Admission: RE | Admit: 2024-09-12 | Discharge: 2024-09-18 | DRG: 236 | Disposition: A

## 2024-09-12 ENCOUNTER — Encounter (HOSPITAL_COMMUNITY): Payer: Self-pay

## 2024-09-12 DIAGNOSIS — I5023 Acute on chronic systolic (congestive) heart failure: Secondary | ICD-10-CM | POA: Diagnosis not present

## 2024-09-12 DIAGNOSIS — N179 Acute kidney failure, unspecified: Secondary | ICD-10-CM

## 2024-09-12 DIAGNOSIS — E669 Obesity, unspecified: Secondary | ICD-10-CM | POA: Diagnosis present

## 2024-09-12 DIAGNOSIS — D696 Thrombocytopenia, unspecified: Secondary | ICD-10-CM | POA: Diagnosis not present

## 2024-09-12 DIAGNOSIS — G2581 Restless legs syndrome: Secondary | ICD-10-CM | POA: Diagnosis present

## 2024-09-12 DIAGNOSIS — I509 Heart failure, unspecified: Secondary | ICD-10-CM

## 2024-09-12 DIAGNOSIS — Z79890 Hormone replacement therapy (postmenopausal): Secondary | ICD-10-CM

## 2024-09-12 DIAGNOSIS — Z8249 Family history of ischemic heart disease and other diseases of the circulatory system: Secondary | ICD-10-CM

## 2024-09-12 DIAGNOSIS — Z886 Allergy status to analgesic agent status: Secondary | ICD-10-CM

## 2024-09-12 DIAGNOSIS — Z91040 Latex allergy status: Secondary | ICD-10-CM

## 2024-09-12 DIAGNOSIS — Z8261 Family history of arthritis: Secondary | ICD-10-CM

## 2024-09-12 DIAGNOSIS — T82855A Stenosis of coronary artery stent, initial encounter: Secondary | ICD-10-CM | POA: Diagnosis present

## 2024-09-12 DIAGNOSIS — I110 Hypertensive heart disease with heart failure: Secondary | ICD-10-CM | POA: Diagnosis present

## 2024-09-12 DIAGNOSIS — F17210 Nicotine dependence, cigarettes, uncomplicated: Secondary | ICD-10-CM | POA: Diagnosis present

## 2024-09-12 DIAGNOSIS — E1159 Type 2 diabetes mellitus with other circulatory complications: Secondary | ICD-10-CM

## 2024-09-12 DIAGNOSIS — E785 Hyperlipidemia, unspecified: Secondary | ICD-10-CM | POA: Diagnosis present

## 2024-09-12 DIAGNOSIS — Z800 Family history of malignant neoplasm of digestive organs: Secondary | ICD-10-CM

## 2024-09-12 DIAGNOSIS — Z820 Family history of epilepsy and other diseases of the nervous system: Secondary | ICD-10-CM

## 2024-09-12 DIAGNOSIS — F17290 Nicotine dependence, other tobacco product, uncomplicated: Secondary | ICD-10-CM | POA: Diagnosis present

## 2024-09-12 DIAGNOSIS — D62 Acute posthemorrhagic anemia: Secondary | ICD-10-CM | POA: Diagnosis not present

## 2024-09-12 DIAGNOSIS — F05 Delirium due to known physiological condition: Secondary | ICD-10-CM | POA: Diagnosis not present

## 2024-09-12 DIAGNOSIS — F1290 Cannabis use, unspecified, uncomplicated: Secondary | ICD-10-CM | POA: Diagnosis present

## 2024-09-12 DIAGNOSIS — I252 Old myocardial infarction: Secondary | ICD-10-CM

## 2024-09-12 DIAGNOSIS — F32A Depression, unspecified: Secondary | ICD-10-CM | POA: Diagnosis present

## 2024-09-12 DIAGNOSIS — Z951 Presence of aortocoronary bypass graft: Principal | ICD-10-CM

## 2024-09-12 DIAGNOSIS — Z7985 Long-term (current) use of injectable non-insulin antidiabetic drugs: Secondary | ICD-10-CM

## 2024-09-12 DIAGNOSIS — E039 Hypothyroidism, unspecified: Secondary | ICD-10-CM | POA: Diagnosis present

## 2024-09-12 DIAGNOSIS — I493 Ventricular premature depolarization: Secondary | ICD-10-CM | POA: Diagnosis not present

## 2024-09-12 DIAGNOSIS — E8881 Metabolic syndrome: Secondary | ICD-10-CM | POA: Diagnosis present

## 2024-09-12 DIAGNOSIS — Z79899 Other long term (current) drug therapy: Secondary | ICD-10-CM

## 2024-09-12 DIAGNOSIS — E861 Hypovolemia: Secondary | ICD-10-CM | POA: Diagnosis present

## 2024-09-12 DIAGNOSIS — I2510 Atherosclerotic heart disease of native coronary artery without angina pectoris: Principal | ICD-10-CM

## 2024-09-12 DIAGNOSIS — K5900 Constipation, unspecified: Secondary | ICD-10-CM | POA: Diagnosis present

## 2024-09-12 DIAGNOSIS — Z818 Family history of other mental and behavioral disorders: Secondary | ICD-10-CM

## 2024-09-12 DIAGNOSIS — M797 Fibromyalgia: Secondary | ICD-10-CM | POA: Diagnosis present

## 2024-09-12 DIAGNOSIS — Y831 Surgical operation with implant of artificial internal device as the cause of abnormal reaction of the patient, or of later complication, without mention of misadventure at the time of the procedure: Secondary | ICD-10-CM | POA: Diagnosis present

## 2024-09-12 DIAGNOSIS — I25119 Atherosclerotic heart disease of native coronary artery with unspecified angina pectoris: Secondary | ICD-10-CM

## 2024-09-12 DIAGNOSIS — E8729 Other acidosis: Secondary | ICD-10-CM | POA: Diagnosis not present

## 2024-09-12 DIAGNOSIS — Z87891 Personal history of nicotine dependence: Secondary | ICD-10-CM | POA: Diagnosis not present

## 2024-09-12 DIAGNOSIS — Z7902 Long term (current) use of antithrombotics/antiplatelets: Secondary | ICD-10-CM

## 2024-09-12 DIAGNOSIS — E1151 Type 2 diabetes mellitus with diabetic peripheral angiopathy without gangrene: Secondary | ICD-10-CM | POA: Diagnosis present

## 2024-09-12 DIAGNOSIS — E1140 Type 2 diabetes mellitus with diabetic neuropathy, unspecified: Secondary | ICD-10-CM | POA: Diagnosis present

## 2024-09-12 DIAGNOSIS — Z833 Family history of diabetes mellitus: Secondary | ICD-10-CM

## 2024-09-12 DIAGNOSIS — I5022 Chronic systolic (congestive) heart failure: Secondary | ICD-10-CM | POA: Diagnosis present

## 2024-09-12 DIAGNOSIS — Z8279 Family history of other congenital malformations, deformations and chromosomal abnormalities: Secondary | ICD-10-CM

## 2024-09-12 DIAGNOSIS — Z955 Presence of coronary angioplasty implant and graft: Secondary | ICD-10-CM

## 2024-09-12 DIAGNOSIS — I9581 Postprocedural hypotension: Secondary | ICD-10-CM | POA: Diagnosis not present

## 2024-09-12 DIAGNOSIS — Z880 Allergy status to penicillin: Secondary | ICD-10-CM

## 2024-09-12 DIAGNOSIS — E119 Type 2 diabetes mellitus without complications: Secondary | ICD-10-CM | POA: Diagnosis not present

## 2024-09-12 DIAGNOSIS — Z6830 Body mass index (BMI) 30.0-30.9, adult: Secondary | ICD-10-CM

## 2024-09-12 HISTORY — PX: CORONARY ARTERY BYPASS GRAFT: SHX141

## 2024-09-12 HISTORY — PX: INTRAOPERATIVE TRANSESOPHAGEAL ECHOCARDIOGRAM: SHX5062

## 2024-09-12 LAB — POCT I-STAT 7, (LYTES, BLD GAS, ICA,H+H)
Acid-base deficit: 2 mmol/L (ref 0.0–2.0)
Acid-base deficit: 2 mmol/L (ref 0.0–2.0)
Acid-base deficit: 3 mmol/L — ABNORMAL HIGH (ref 0.0–2.0)
Acid-base deficit: 6 mmol/L — ABNORMAL HIGH (ref 0.0–2.0)
Acid-base deficit: 3 mmol/L — ABNORMAL HIGH (ref 0.0–2.0)
Acid-base deficit: 5 mmol/L — ABNORMAL HIGH (ref 0.0–2.0)
Bicarbonate: 22.2 mmol/L (ref 20.0–28.0)
Bicarbonate: 22.7 mmol/L (ref 20.0–28.0)
Bicarbonate: 23.1 mmol/L (ref 20.0–28.0)
Bicarbonate: 22.5 mmol/L (ref 20.0–28.0)
Bicarbonate: 24.2 mmol/L (ref 20.0–28.0)
Bicarbonate: 21.1 mmol/L (ref 20.0–28.0)
Calcium, Ion: 0.89 mmol/L — CL (ref 1.15–1.40)
Calcium, Ion: 1.17 mmol/L (ref 1.15–1.40)
Calcium, Ion: 1.18 mmol/L (ref 1.15–1.40)
Calcium, Ion: 1.19 mmol/L (ref 1.15–1.40)
Calcium, Ion: 1.08 mmol/L — ABNORMAL LOW (ref 1.15–1.40)
Calcium, Ion: 1.07 mmol/L — ABNORMAL LOW (ref 1.15–1.40)
HCT: 25 % — ABNORMAL LOW (ref 36.0–46.0)
HCT: 24 % — ABNORMAL LOW (ref 36.0–46.0)
HCT: 28 % — ABNORMAL LOW (ref 36.0–46.0)
HCT: 30 % — ABNORMAL LOW (ref 36.0–46.0)
HCT: 24 % — ABNORMAL LOW (ref 36.0–46.0)
HCT: 22 % — ABNORMAL LOW (ref 36.0–46.0)
Hemoglobin: 8.5 g/dL — ABNORMAL LOW (ref 12.0–15.0)
Hemoglobin: 8.2 g/dL — ABNORMAL LOW (ref 12.0–15.0)
Hemoglobin: 9.5 g/dL — ABNORMAL LOW (ref 12.0–15.0)
Hemoglobin: 10.2 g/dL — ABNORMAL LOW (ref 12.0–15.0)
Hemoglobin: 8.2 g/dL — ABNORMAL LOW (ref 12.0–15.0)
Hemoglobin: 7.5 g/dL — ABNORMAL LOW (ref 12.0–15.0)
O2 Saturation: 100 %
O2 Saturation: 100 %
O2 Saturation: 99 %
O2 Saturation: 93 %
O2 Saturation: 97 %
O2 Saturation: 98 %
Patient temperature: 36.2
Patient temperature: 36.4
Patient temperature: 37.3
Patient temperature: 99.2
Potassium: 4.2 mmol/L (ref 3.5–5.1)
Potassium: 3.9 mmol/L (ref 3.5–5.1)
Potassium: 3.4 mmol/L — ABNORMAL LOW (ref 3.5–5.1)
Potassium: 4.7 mmol/L (ref 3.5–5.1)
Potassium: 4.4 mmol/L (ref 3.5–5.1)
Potassium: 3.9 mmol/L (ref 3.5–5.1)
Sodium: 135 mmol/L (ref 135–145)
Sodium: 139 mmol/L (ref 135–145)
Sodium: 142 mmol/L (ref 135–145)
Sodium: 142 mmol/L (ref 135–145)
Sodium: 143 mmol/L (ref 135–145)
Sodium: 145 mmol/L (ref 135–145)
TCO2: 23 mmol/L (ref 22–32)
TCO2: 24 mmol/L (ref 22–32)
TCO2: 24 mmol/L (ref 22–32)
TCO2: 24 mmol/L (ref 22–32)
TCO2: 26 mmol/L (ref 22–32)
TCO2: 22 mmol/L (ref 22–32)
pCO2 arterial: 34.6 mmHg (ref 32–48)
pCO2 arterial: 38 mmHg (ref 32–48)
pCO2 arterial: 43.7 mmHg (ref 32–48)
pCO2 arterial: 56.5 mmHg — ABNORMAL HIGH (ref 32–48)
pCO2 arterial: 51.7 mmHg — ABNORMAL HIGH (ref 32–48)
pCO2 arterial: 42.3 mmHg (ref 32–48)
pH, Arterial: 7.417 (ref 7.35–7.45)
pH, Arterial: 7.385 (ref 7.35–7.45)
pH, Arterial: 7.328 — ABNORMAL LOW (ref 7.35–7.45)
pH, Arterial: 7.204 — ABNORMAL LOW (ref 7.35–7.45)
pH, Arterial: 7.279 — ABNORMAL LOW (ref 7.35–7.45)
pH, Arterial: 7.307 — ABNORMAL LOW (ref 7.35–7.45)
pO2, Arterial: 367 mmHg — ABNORMAL HIGH (ref 83–108)
pO2, Arterial: 245 mmHg — ABNORMAL HIGH (ref 83–108)
pO2, Arterial: 131 mmHg — ABNORMAL HIGH (ref 83–108)
pO2, Arterial: 82 mmHg — ABNORMAL LOW (ref 83–108)
pO2, Arterial: 103 mmHg (ref 83–108)
pO2, Arterial: 121 mmHg — ABNORMAL HIGH (ref 83–108)

## 2024-09-12 LAB — BASIC METABOLIC PANEL WITH GFR
Anion gap: 9 (ref 5–15)
BUN: 15 mg/dL (ref 8–23)
CO2: 25 mmol/L (ref 22–32)
Calcium: 8 mg/dL — ABNORMAL LOW (ref 8.9–10.3)
Chloride: 108 mmol/L (ref 98–111)
Creatinine, Ser: 0.9 mg/dL (ref 0.44–1.00)
GFR, Estimated: >60 mL/min (ref >60)
Glucose, Bld: 132 mg/dL — ABNORMAL HIGH (ref 70–99)
Potassium: 4.4 mmol/L (ref 3.5–5.1)
Sodium: 142 mmol/L (ref 135–145)

## 2024-09-12 LAB — POCT I-STAT EG7
Acid-base deficit: 1 mmol/L (ref 0.0–2.0)
Bicarbonate: 23.4 mmol/L (ref 20.0–28.0)
Calcium, Ion: 0.98 mmol/L — ABNORMAL LOW (ref 1.15–1.40)
HCT: 26 % — ABNORMAL LOW (ref 36.0–46.0)
Hemoglobin: 8.8 g/dL — ABNORMAL LOW (ref 12.0–15.0)
O2 Saturation: 83 %
Potassium: 3.7 mmol/L (ref 3.5–5.1)
Sodium: 137 mmol/L (ref 135–145)
TCO2: 25 mmol/L (ref 22–32)
pCO2, Ven: 37.1 mmHg — ABNORMAL LOW (ref 44–60)
pH, Ven: 7.408 (ref 7.25–7.43)
pO2, Ven: 47 mmHg — ABNORMAL HIGH (ref 32–45)

## 2024-09-12 LAB — PREPARE RBC (CROSSMATCH)

## 2024-09-12 LAB — CBC
HCT: 28.1 % — ABNORMAL LOW (ref 36.0–46.0)
HCT: 26.5 % — ABNORMAL LOW (ref 36.0–46.0)
Hemoglobin: 9.3 g/dL — ABNORMAL LOW (ref 12.0–15.0)
Hemoglobin: 8.5 g/dL — ABNORMAL LOW (ref 12.0–15.0)
MCH: 30.7 pg (ref 26.0–34.0)
MCH: 30.2 pg (ref 26.0–34.0)
MCHC: 33.1 g/dL (ref 30.0–36.0)
MCHC: 32.1 g/dL (ref 30.0–36.0)
MCV: 92.7 fL (ref 80.0–100.0)
MCV: 94.3 fL (ref 80.0–100.0)
Platelets: 126 K/uL — ABNORMAL LOW (ref 150–400)
Platelets: 127 K/uL — ABNORMAL LOW (ref 150–400)
RBC: 3.03 MIL/uL — ABNORMAL LOW (ref 3.87–5.11)
RBC: 2.81 MIL/uL — ABNORMAL LOW (ref 3.87–5.11)
RDW: 13.3 % (ref 11.5–15.5)
RDW: 13.4 % (ref 11.5–15.5)
WBC: 9.3 K/uL (ref 4.0–10.5)
WBC: 8.6 K/uL (ref 4.0–10.5)
nRBC: 0 % (ref 0.0–0.2)
nRBC: 0 % (ref 0.0–0.2)

## 2024-09-12 LAB — GLUCOSE, CAPILLARY
Glucose-Capillary: 112 mg/dL — ABNORMAL HIGH (ref 70–99)
Glucose-Capillary: 115 mg/dL — ABNORMAL HIGH (ref 70–99)
Glucose-Capillary: 116 mg/dL — ABNORMAL HIGH (ref 70–99)
Glucose-Capillary: 130 mg/dL — ABNORMAL HIGH (ref 70–99)
Glucose-Capillary: 131 mg/dL — ABNORMAL HIGH (ref 70–99)
Glucose-Capillary: 142 mg/dL — ABNORMAL HIGH (ref 70–99)
Glucose-Capillary: 154 mg/dL — ABNORMAL HIGH (ref 70–99)
Glucose-Capillary: 205 mg/dL — ABNORMAL HIGH (ref 70–99)

## 2024-09-12 LAB — POCT I-STAT, CHEM 8
BUN: 24 mg/dL — ABNORMAL HIGH (ref 8–23)
BUN: 22 mg/dL (ref 8–23)
BUN: 18 mg/dL (ref 8–23)
BUN: 19 mg/dL (ref 8–23)
BUN: 19 mg/dL (ref 8–23)
Calcium, Ion: 1.19 mmol/L (ref 1.15–1.40)
Calcium, Ion: 1.22 mmol/L (ref 1.15–1.40)
Calcium, Ion: 0.91 mmol/L — ABNORMAL LOW (ref 1.15–1.40)
Calcium, Ion: 0.94 mmol/L — ABNORMAL LOW (ref 1.15–1.40)
Calcium, Ion: 1.16 mmol/L (ref 1.15–1.40)
Chloride: 102 mmol/L (ref 98–111)
Chloride: 103 mmol/L (ref 98–111)
Chloride: 101 mmol/L (ref 98–111)
Chloride: 99 mmol/L (ref 98–111)
Chloride: 103 mmol/L (ref 98–111)
Creatinine, Ser: 1 mg/dL (ref 0.44–1.00)
Creatinine, Ser: 0.9 mg/dL (ref 0.44–1.00)
Creatinine, Ser: 0.8 mg/dL (ref 0.44–1.00)
Creatinine, Ser: 0.8 mg/dL (ref 0.44–1.00)
Creatinine, Ser: 0.8 mg/dL (ref 0.44–1.00)
Glucose, Bld: 163 mg/dL — ABNORMAL HIGH (ref 70–99)
Glucose, Bld: 133 mg/dL — ABNORMAL HIGH (ref 70–99)
Glucose, Bld: 124 mg/dL — ABNORMAL HIGH (ref 70–99)
Glucose, Bld: 97 mg/dL (ref 70–99)
Glucose, Bld: 159 mg/dL — ABNORMAL HIGH (ref 70–99)
HCT: 37 % (ref 36.0–46.0)
HCT: 33 % — ABNORMAL LOW (ref 36.0–46.0)
HCT: 21 % — ABNORMAL LOW (ref 36.0–46.0)
HCT: 21 % — ABNORMAL LOW (ref 36.0–46.0)
HCT: 24 % — ABNORMAL LOW (ref 36.0–46.0)
Hemoglobin: 12.6 g/dL (ref 12.0–15.0)
Hemoglobin: 11.2 g/dL — ABNORMAL LOW (ref 12.0–15.0)
Hemoglobin: 7.1 g/dL — ABNORMAL LOW (ref 12.0–15.0)
Hemoglobin: 7.1 g/dL — ABNORMAL LOW (ref 12.0–15.0)
Hemoglobin: 8.2 g/dL — ABNORMAL LOW (ref 12.0–15.0)
Potassium: 4.7 mmol/L (ref 3.5–5.1)
Potassium: 4 mmol/L (ref 3.5–5.1)
Potassium: 4.3 mmol/L (ref 3.5–5.1)
Potassium: 4.6 mmol/L (ref 3.5–5.1)
Potassium: 3.9 mmol/L (ref 3.5–5.1)
Sodium: 139 mmol/L (ref 135–145)
Sodium: 138 mmol/L (ref 135–145)
Sodium: 136 mmol/L (ref 135–145)
Sodium: 138 mmol/L (ref 135–145)
Sodium: 139 mmol/L (ref 135–145)
TCO2: 27 mmol/L (ref 22–32)
TCO2: 23 mmol/L (ref 22–32)
TCO2: 25 mmol/L (ref 22–32)
TCO2: 27 mmol/L (ref 22–32)
TCO2: 24 mmol/L (ref 22–32)

## 2024-09-12 LAB — PROTIME-INR
INR: 1.4 — ABNORMAL HIGH (ref 0.8–1.2)
Prothrombin Time: 18.2 s — ABNORMAL HIGH (ref 11.4–15.2)

## 2024-09-12 LAB — HEMOGLOBIN AND HEMATOCRIT, BLOOD
HCT: 23 % — ABNORMAL LOW (ref 36.0–46.0)
Hemoglobin: 7.7 g/dL — ABNORMAL LOW (ref 12.0–15.0)

## 2024-09-12 LAB — ECHO INTRAOPERATIVE TEE
Height: 67 in
S' Lateral: 3 cm
Weight: 2927.71 [oz_av]

## 2024-09-12 LAB — ABO/RH: ABO/RH(D): O NEG

## 2024-09-12 LAB — APTT: aPTT: 34 s (ref 24–36)

## 2024-09-12 LAB — FIBRINOGEN: Fibrinogen: 168 mg/dL — ABNORMAL LOW (ref 210–475)

## 2024-09-12 LAB — MAGNESIUM: Magnesium: 3.2 mg/dL — ABNORMAL HIGH (ref 1.7–2.4)

## 2024-09-12 LAB — PLATELET COUNT: Platelets: 119 K/uL — ABNORMAL LOW (ref 150–400)

## 2024-09-12 SURGERY — CORONARY ARTERY BYPASS GRAFTING (CABG)
Anesthesia: General | Site: Chest

## 2024-09-12 MED ORDER — CYCLOBENZAPRINE HCL 5 MG PO TABS
5.0000 mg | ORAL_TABLET | Freq: Every day | ORAL | Status: DC
  Administered 2024-09-12 – 2024-09-13 (×2): 5 mg via ORAL
  Filled 2024-09-12 (×2): qty 1

## 2024-09-12 MED ORDER — FENTANYL CITRATE (PF) 250 MCG/5ML IJ SOLN
INTRAMUSCULAR | Status: DC | PRN
  Administered 2024-09-12: 10:00:00 250 ug via INTRAVENOUS
  Administered 2024-09-12: 13:00:00 100 ug via INTRAVENOUS
  Administered 2024-09-12: 08:00:00 150 ug via INTRAVENOUS
  Administered 2024-09-12: 09:00:00 100 ug via INTRAVENOUS
  Administered 2024-09-12 (×2): 250 ug via INTRAVENOUS
  Administered 2024-09-12: 07:00:00 50 ug via INTRAVENOUS
  Administered 2024-09-12: 08:00:00 200 ug via INTRAVENOUS

## 2024-09-12 MED ORDER — METOPROLOL TARTRATE 12.5 MG HALF TABLET
12.5000 mg | ORAL_TABLET | Freq: Once | ORAL | Status: AC
  Administered 2024-09-12: 07:00:00 12.5 mg via ORAL
  Filled 2024-09-12: qty 1

## 2024-09-12 MED ORDER — 0.9 % SODIUM CHLORIDE (POUR BTL) OPTIME
TOPICAL | Status: DC | PRN
  Administered 2024-09-12: 07:00:00 5000 mL

## 2024-09-12 MED ORDER — MIDAZOLAM HCL (PF) 2 MG/2ML IJ SOLN: 2.0000 mg | INTRAMUSCULAR | Status: DC | PRN

## 2024-09-12 MED ORDER — DEXTROSE 50 % IV SOLN: 0.0000 mL | INTRAVENOUS | Status: DC | PRN

## 2024-09-12 MED ORDER — SODIUM CHLORIDE 0.45 % IV SOLN: INTRAVENOUS | Status: AC | PRN

## 2024-09-12 MED ORDER — FENTANYL CITRATE (PF) 250 MCG/5ML IJ SOLN
INTRAMUSCULAR | Status: AC
  Filled 2024-09-12: qty 5

## 2024-09-12 MED ORDER — SODIUM CHLORIDE 0.9% FLUSH
3.0000 mL | Freq: Two times a day (BID) | INTRAVENOUS | Status: DC
  Administered 2024-09-13 – 2024-09-14 (×4): 3 mL via INTRAVENOUS

## 2024-09-12 MED ORDER — NOREPINEPHRINE 4 MG/250ML-% IV SOLN: 0.0000 ug/min | INTRAVENOUS | Status: DC

## 2024-09-12 MED ORDER — FENTANYL CITRATE (PF) 100 MCG/2ML IJ SOLN
INTRAMUSCULAR | Status: AC
  Filled 2024-09-12: qty 2

## 2024-09-12 MED ORDER — ACETAMINOPHEN 160 MG/5ML PO SOLN: 1000.0000 mg | Freq: Four times a day (QID) | ORAL | Status: AC

## 2024-09-12 MED ORDER — NITROGLYCERIN 0.2 MG/ML ON CALL CATH LAB
INTRAVENOUS | Status: DC | PRN
  Administered 2024-09-12: 08:00:00 20 ug via INTRAVENOUS
  Administered 2024-09-12: 08:00:00 40 ug via INTRAVENOUS

## 2024-09-12 MED ORDER — SODIUM CHLORIDE 0.9 % IV SOLN: INTRAVENOUS | Status: DC | PRN

## 2024-09-12 MED ORDER — ACETAMINOPHEN 500 MG PO TABS
1000.0000 mg | ORAL_TABLET | Freq: Four times a day (QID) | ORAL | Status: AC
  Administered 2024-09-12 – 2024-09-17 (×15): 1000 mg via ORAL
  Filled 2024-09-12 (×16): qty 2

## 2024-09-12 MED ORDER — ACETAMINOPHEN 500 MG PO TABS
1000.0000 mg | ORAL_TABLET | Freq: Once | ORAL | Status: AC
  Administered 2024-09-12: 07:00:00 1000 mg via ORAL
  Filled 2024-09-12: qty 2

## 2024-09-12 MED ORDER — THROMBIN 20000 UNITS EX SOLR: OROMUCOSAL | Status: DC | PRN

## 2024-09-12 MED ORDER — ACETAMINOPHEN 160 MG/5ML PO SOLN
650.0000 mg | Freq: Once | ORAL | Status: AC
  Administered 2024-09-12: 17:00:00 650 mg
  Filled 2024-09-12: qty 20.3

## 2024-09-12 MED ORDER — PROTAMINE SULFATE 10 MG/ML IV SOLN
INTRAVENOUS | Status: DC | PRN
  Administered 2024-09-12 (×2): 50 mg via INTRAVENOUS
  Administered 2024-09-12: 12:00:00 30 mg via INTRAVENOUS
  Administered 2024-09-12: 12:00:00 270 mg via INTRAVENOUS

## 2024-09-12 MED ORDER — LACTATED RINGERS IV SOLN: INTRAVENOUS | Status: AC

## 2024-09-12 MED ORDER — ~~LOC~~ CARDIAC SURGERY, PATIENT & FAMILY EDUCATION
Freq: Once | Status: DC
  Filled 2024-09-12: qty 1

## 2024-09-12 MED ORDER — ROSUVASTATIN CALCIUM 20 MG PO TABS
20.0000 mg | ORAL_TABLET | Freq: Every day | ORAL | Status: DC
  Administered 2024-09-13 – 2024-09-18 (×6): 20 mg via ORAL
  Filled 2024-09-12 (×5): qty 1

## 2024-09-12 MED ORDER — CHLORHEXIDINE GLUCONATE 4 % EX SOLN: 30.0000 mL | CUTANEOUS | Status: DC

## 2024-09-12 MED ORDER — ALBUMIN HUMAN 5 % IV SOLN: INTRAVENOUS | Status: DC | PRN

## 2024-09-12 MED ORDER — ALBUMIN HUMAN 25 % IV SOLN
50.0000 g | Freq: Once | INTRAVENOUS | Status: DC
  Filled 2024-09-12: qty 200

## 2024-09-12 MED ORDER — HEPARIN SODIUM (PORCINE) 1000 UNIT/ML IJ SOLN
INTRAMUSCULAR | Status: DC | PRN
  Administered 2024-09-12: 09:00:00 30000 [IU] via INTRAVENOUS

## 2024-09-12 MED ORDER — ROCURONIUM BROMIDE 10 MG/ML (PF) SYRINGE
PREFILLED_SYRINGE | INTRAVENOUS | Status: DC | PRN
  Administered 2024-09-12: 10:00:00 50 mg via INTRAVENOUS
  Administered 2024-09-12: 08:00:00 100 mg via INTRAVENOUS
  Administered 2024-09-12: 09:00:00 50 mg via INTRAVENOUS
  Administered 2024-09-12: 13:00:00 20 mg via INTRAVENOUS

## 2024-09-12 MED ORDER — LEVOTHYROXINE SODIUM 25 MCG PO TABS
25.0000 ug | ORAL_TABLET | Freq: Every day | ORAL | Status: DC
  Administered 2024-09-13 – 2024-09-18 (×6): 25 ug via ORAL
  Filled 2024-09-12 (×6): qty 1

## 2024-09-12 MED ORDER — CLEVIDIPINE BUTYRATE 0.5 MG/ML IV EMUL: 0.0000 mg/h | INTRAVENOUS | Status: DC

## 2024-09-12 MED ORDER — BISACODYL 5 MG PO TBEC
10.0000 mg | DELAYED_RELEASE_TABLET | Freq: Every day | ORAL | Status: DC
  Administered 2024-09-13 – 2024-09-15 (×3): 10 mg via ORAL
  Filled 2024-09-12 (×3): qty 2

## 2024-09-12 MED ORDER — POTASSIUM CHLORIDE 10 MEQ/50ML IV SOLN
10.0000 meq | INTRAVENOUS | Status: AC
  Administered 2024-09-12 (×3): 10 meq via INTRAVENOUS

## 2024-09-12 MED ORDER — DOCUSATE SODIUM 100 MG PO CAPS
200.0000 mg | ORAL_CAPSULE | Freq: Every day | ORAL | Status: DC
  Administered 2024-09-13 – 2024-09-16 (×4): 200 mg via ORAL
  Filled 2024-09-12 (×6): qty 2

## 2024-09-12 MED ORDER — LEVOCETIRIZINE DIHYDROCHLORIDE 5 MG PO TABS: 5.0000 mg | ORAL_TABLET | Freq: Every day | ORAL | Status: DC

## 2024-09-12 MED ORDER — LORATADINE 10 MG PO TABS
10.0000 mg | ORAL_TABLET | Freq: Every day | ORAL | Status: DC
  Administered 2024-09-13 – 2024-09-18 (×6): 10 mg via ORAL
  Filled 2024-09-12 (×6): qty 1

## 2024-09-12 MED ORDER — CHLORHEXIDINE GLUCONATE 0.12 % MT SOLN
15.0000 mL | Freq: Once | OROMUCOSAL | Status: AC
  Administered 2024-09-12: 07:00:00 15 mL via OROMUCOSAL
  Filled 2024-09-12: qty 15

## 2024-09-12 MED ORDER — SODIUM CHLORIDE 0.9 % IV SOLN: INTRAVENOUS | Status: AC

## 2024-09-12 MED ORDER — ONDANSETRON HCL 4 MG/2ML IJ SOLN: 4.0000 mg | Freq: Four times a day (QID) | INTRAMUSCULAR | Status: DC | PRN

## 2024-09-12 MED ORDER — PANTOPRAZOLE SODIUM 40 MG IV SOLR
40.0000 mg | Freq: Every day | INTRAVENOUS | Status: AC
  Administered 2024-09-12 – 2024-09-13 (×2): 40 mg via INTRAVENOUS
  Filled 2024-09-12 (×2): qty 10

## 2024-09-12 MED ORDER — PROPOFOL 10 MG/ML IV BOLUS
INTRAVENOUS | Status: AC
  Filled 2024-09-12: qty 20

## 2024-09-12 MED ORDER — MIDAZOLAM HCL (PF) 5 MG/ML IJ SOLN
INTRAMUSCULAR | Status: DC | PRN
  Administered 2024-09-12: 08:00:00 3 mg via INTRAVENOUS
  Administered 2024-09-12: 07:00:00 2 mg via INTRAVENOUS

## 2024-09-12 MED ORDER — INSULIN REGULAR(HUMAN) IN NACL 100-0.9 UT/100ML-% IV SOLN: INTRAVENOUS | Status: AC

## 2024-09-12 MED ORDER — ALBUTEROL SULFATE (2.5 MG/3ML) 0.083% IN NEBU: 3.0000 mL | INHALATION_SOLUTION | RESPIRATORY_TRACT | Status: DC | PRN

## 2024-09-12 MED ORDER — PROTAMINE SULFATE 10 MG/ML IV SOLN
INTRAVENOUS | Status: AC
  Filled 2024-09-12: qty 25

## 2024-09-12 MED ORDER — CHLORHEXIDINE GLUCONATE CLOTH 2 % EX PADS
6.0000 | MEDICATED_PAD | Freq: Every day | CUTANEOUS | Status: DC
  Administered 2024-09-12 – 2024-09-15 (×4): 6 via TOPICAL

## 2024-09-12 MED ORDER — PLASMA-LYTE A IV SOLN: INTRAVENOUS | Status: DC | PRN

## 2024-09-12 MED ORDER — VANCOMYCIN HCL IN DEXTROSE 1-5 GM/200ML-% IV SOLN
1000.0000 mg | Freq: Once | INTRAVENOUS | Status: AC
  Administered 2024-09-12: 21:00:00 1000 mg via INTRAVENOUS
  Filled 2024-09-12: qty 200

## 2024-09-12 MED ORDER — METOCLOPRAMIDE HCL 5 MG/ML IJ SOLN
10.0000 mg | Freq: Four times a day (QID) | INTRAMUSCULAR | Status: AC
  Administered 2024-09-12 – 2024-09-13 (×6): 10 mg via INTRAVENOUS
  Filled 2024-09-12 (×6): qty 2

## 2024-09-12 MED ORDER — PHENYLEPHRINE 80 MCG/ML (10ML) SYRINGE FOR IV PUSH (FOR BLOOD PRESSURE SUPPORT)
PREFILLED_SYRINGE | INTRAVENOUS | Status: DC | PRN
  Administered 2024-09-12: 09:00:00 80 ug via INTRAVENOUS
  Administered 2024-09-12 (×3): 40 ug via INTRAVENOUS
  Administered 2024-09-12 (×2): 20 ug via INTRAVENOUS
  Administered 2024-09-12: 08:00:00 80 ug via INTRAVENOUS

## 2024-09-12 MED ORDER — PROPOFOL 500 MG/50ML IV EMUL
INTRAVENOUS | Status: DC | PRN
  Administered 2024-09-12: 13:00:00 25 ug/kg/min via INTRAVENOUS

## 2024-09-12 MED ORDER — LACTATED RINGERS IV SOLN: INTRAVENOUS | Status: DC

## 2024-09-12 MED ORDER — HEPARIN SODIUM (PORCINE) 1000 UNIT/ML IJ SOLN
INTRAMUSCULAR | Status: AC
  Filled 2024-09-12: qty 1

## 2024-09-12 MED ORDER — DEXMEDETOMIDINE HCL IN NACL 400 MCG/100ML IV SOLN
0.0000 ug/kg/h | INTRAVENOUS | Status: DC
  Administered 2024-09-12: 14:00:00 0.7 ug/kg/h via INTRAVENOUS
  Filled 2024-09-12: qty 100

## 2024-09-12 MED ORDER — METOPROLOL TARTRATE 5 MG/5ML IV SOLN: 2.5000 mg | INTRAVENOUS | Status: DC | PRN

## 2024-09-12 MED ORDER — DULOXETINE HCL 60 MG PO CPEP
60.0000 mg | ORAL_CAPSULE | Freq: Every day | ORAL | Status: DC
  Administered 2024-09-13 – 2024-09-18 (×6): 60 mg via ORAL
  Filled 2024-09-12 (×6): qty 1

## 2024-09-12 MED ORDER — PROTAMINE SULFATE 10 MG/ML IV SOLN
INTRAVENOUS | Status: AC
  Filled 2024-09-12: qty 5

## 2024-09-12 MED ORDER — THROMBIN (RECOMBINANT) 20000 UNITS EX SOLR
CUTANEOUS | Status: AC
  Filled 2024-09-12: qty 20000

## 2024-09-12 MED ORDER — HEMOSTATIC AGENTS (NO CHARGE) OPTIME
TOPICAL | Status: DC | PRN
  Administered 2024-09-12: 10:00:00 1 via TOPICAL

## 2024-09-12 MED ORDER — OXYCODONE HCL 5 MG PO TABS
5.0000 mg | ORAL_TABLET | ORAL | Status: DC | PRN
  Administered 2024-09-12 – 2024-09-14 (×3): 10 mg via ORAL
  Filled 2024-09-12 (×3): qty 2

## 2024-09-12 MED ORDER — PANTOPRAZOLE SODIUM 40 MG PO TBEC
40.0000 mg | DELAYED_RELEASE_TABLET | Freq: Every day | ORAL | Status: DC
  Administered 2024-09-14 – 2024-09-18 (×5): 40 mg via ORAL
  Filled 2024-09-12 (×4): qty 1

## 2024-09-12 MED ORDER — PANTOPRAZOLE SODIUM 40 MG PO TBEC
40.0000 mg | DELAYED_RELEASE_TABLET | Freq: Every day | ORAL | Status: DC
  Administered 2024-09-13: 10:00:00 40 mg via ORAL
  Filled 2024-09-12 (×3): qty 1

## 2024-09-12 MED ORDER — MAGNESIUM OXIDE -MG SUPPLEMENT 400 (240 MG) MG PO TABS
400.0000 mg | ORAL_TABLET | Freq: Every day | ORAL | Status: DC
  Administered 2024-09-14 – 2024-09-18 (×4): 400 mg via ORAL
  Filled 2024-09-12 (×6): qty 1

## 2024-09-12 MED ORDER — MAGNESIUM SULFATE 4 GM/100ML IV SOLN
4.0000 g | Freq: Once | INTRAVENOUS | Status: AC
  Administered 2024-09-12: 14:00:00 4 g via INTRAVENOUS
  Filled 2024-09-12: qty 100

## 2024-09-12 MED ORDER — METOPROLOL TARTRATE 25 MG/10 ML ORAL SUSPENSION: 12.5000 mg | Freq: Two times a day (BID) | ORAL | Status: DC

## 2024-09-12 MED ORDER — MORPHINE SULFATE (PF) 2 MG/ML IV SOLN
1.0000 mg | INTRAVENOUS | Status: DC | PRN
  Administered 2024-09-12: 14:00:00 2 mg via INTRAVENOUS
  Filled 2024-09-12: qty 1

## 2024-09-12 MED ORDER — BISACODYL 10 MG RE SUPP
10.0000 mg | Freq: Every day | RECTAL | Status: DC
  Administered 2024-09-16: 10 mg via RECTAL
  Filled 2024-09-12: qty 1

## 2024-09-12 MED ORDER — LACTATED RINGERS IV SOLN: INTRAVENOUS | Status: DC | PRN

## 2024-09-12 MED ORDER — MIDAZOLAM HCL (PF) 10 MG/2ML IJ SOLN
INTRAMUSCULAR | Status: AC
  Filled 2024-09-12: qty 2

## 2024-09-12 MED ORDER — SODIUM BICARBONATE 8.4 % IV SOLN
50.0000 meq | Freq: Once | INTRAVENOUS | Status: AC
  Administered 2024-09-12: 17:00:00 50 meq via INTRAVENOUS

## 2024-09-12 MED ORDER — ROCURONIUM BROMIDE 10 MG/ML (PF) SYRINGE
PREFILLED_SYRINGE | INTRAVENOUS | Status: AC
  Filled 2024-09-12: qty 10

## 2024-09-12 MED ORDER — CHLORHEXIDINE GLUCONATE 0.12 % MT SOLN: 15.0000 mL | Freq: Once | OROMUCOSAL | Status: DC

## 2024-09-12 MED ORDER — ORAL CARE MOUTH RINSE: 15.0000 mL | Freq: Once | OROMUCOSAL | Status: AC

## 2024-09-12 MED ORDER — SODIUM CHLORIDE 0.9% FLUSH
3.0000 mL | INTRAVENOUS | Status: DC | PRN
  Administered 2024-09-15: 3 mL via INTRAVENOUS

## 2024-09-12 MED ORDER — SODIUM CHLORIDE 0.9 % IV SOLN: 250.0000 mL | INTRAVENOUS | Status: AC

## 2024-09-12 MED ORDER — TRAMADOL HCL 50 MG PO TABS
50.0000 mg | ORAL_TABLET | ORAL | Status: DC | PRN
  Administered 2024-09-13 – 2024-09-17 (×7): 100 mg via ORAL
  Filled 2024-09-12 (×7): qty 2

## 2024-09-12 MED ORDER — GABAPENTIN 300 MG PO CAPS
600.0000 mg | ORAL_CAPSULE | Freq: Every day | ORAL | Status: DC
  Administered 2024-09-12 – 2024-09-13 (×2): 600 mg via ORAL
  Filled 2024-09-12 (×2): qty 2

## 2024-09-12 MED ORDER — CLEVIDIPINE BUTYRATE 0.5 MG/ML IV EMUL
INTRAVENOUS | Status: DC | PRN
  Administered 2024-09-12: 13:00:00 1 mg/h via INTRAVENOUS

## 2024-09-12 MED ORDER — METOPROLOL TARTRATE 12.5 MG HALF TABLET
12.5000 mg | ORAL_TABLET | Freq: Two times a day (BID) | ORAL | Status: DC
  Administered 2024-09-13 – 2024-09-15 (×6): 12.5 mg via ORAL
  Filled 2024-09-12 (×6): qty 1

## 2024-09-12 MED ORDER — PROPOFOL 10 MG/ML IV BOLUS
INTRAVENOUS | Status: DC | PRN
  Administered 2024-09-12 (×2): 50 mg via INTRAVENOUS

## 2024-09-12 MED ORDER — LACTATED RINGERS IV BOLUS
1000.0000 mL | Freq: Once | INTRAVENOUS | Status: AC
  Administered 2024-09-12: 18:00:00 1000 mL via INTRAVENOUS

## 2024-09-12 MED ORDER — LEVOFLOXACIN IN D5W 750 MG/150ML IV SOLN
750.0000 mg | INTRAVENOUS | Status: AC
  Administered 2024-09-13: 10:00:00 750 mg via INTRAVENOUS
  Filled 2024-09-12: qty 150

## 2024-09-12 MED ORDER — CHLORHEXIDINE GLUCONATE 0.12 % MT SOLN
15.0000 mL | OROMUCOSAL | Status: AC
  Administered 2024-09-12: 14:00:00 15 mL via OROMUCOSAL
  Filled 2024-09-12: qty 15

## 2024-09-12 MED ORDER — ALBUMIN HUMAN 5 % IV SOLN
250.0000 mL | INTRAVENOUS | Status: AC | PRN
  Administered 2024-09-12 (×5): 12.5 g via INTRAVENOUS
  Filled 2024-09-12 (×3): qty 250

## 2024-09-12 SURGICAL SUPPLY — 80 items
ADAPTER MULTI PERFUSION 15 (ADAPTER) ×2 IMPLANT
BAG DECANTER FOR FLEXI CONT (MISCELLANEOUS) ×2 IMPLANT
BLADE CLIPPER SURG (BLADE) ×2 IMPLANT
BLADE STERNUM SYSTEM 6 (BLADE) ×2 IMPLANT
BLADE SURG 11 STRL SS (BLADE) IMPLANT
BNDG ELASTIC 4INX 5YD STR LF (GAUZE/BANDAGES/DRESSINGS) IMPLANT
BNDG ELASTIC 6INX 5YD STR LF (GAUZE/BANDAGES/DRESSINGS) ×2 IMPLANT
BNDG GAUZE DERMACEA FLUFF 4 (GAUZE/BANDAGES/DRESSINGS) ×2 IMPLANT
CANISTER SUCTION 3000ML PPV (SUCTIONS) ×2 IMPLANT
CANNULA AORTIC ROOT 9FR (CANNULA) ×2 IMPLANT
CANNULA MC2 2 STG 36/46 NON-V (CANNULA) IMPLANT
CANNULA NON VENT 20FR 12 (CANNULA) IMPLANT
CANNULA VESSEL 3MM BLUNT TIP (CANNULA) ×2 IMPLANT
CATH THOR RT ANG 28F 9128 SOFT (CATHETERS) ×2 IMPLANT
CATH THORACIC 28FR (CATHETERS) ×2 IMPLANT
CATH THORACIC 36FR (CATHETERS) ×2 IMPLANT
CLIP TI MEDIUM 24 (CLIP) IMPLANT
CLIP TI WIDE RED SMALL 24 (CLIP) IMPLANT
CONTAINER PROTECT SURGISLUSH (MISCELLANEOUS) ×4 IMPLANT
DERMABOND ADVANCED .7 DNX12 (GAUZE/BANDAGES/DRESSINGS) IMPLANT
DRAIN CHANNEL 28F RND 3/8 FF (WOUND CARE) ×2 IMPLANT
DRAPE SRG 135X102X78XABS (DRAPES) ×2 IMPLANT
DRAPE WARM FLUID 44X44 (DRAPES) ×2 IMPLANT
DRESSING PEEL AND PLC PRVNA 13 (GAUZE/BANDAGES/DRESSINGS) IMPLANT
DRESSING PREVENA PLUS CUSTOM (GAUZE/BANDAGES/DRESSINGS) IMPLANT
DRSG COVADERM 4X14 (GAUZE/BANDAGES/DRESSINGS) ×2 IMPLANT
ELECTRODE REM PT RTRN 9FT ADLT (ELECTROSURGICAL) ×4 IMPLANT
FELT TEFLON 1X6 (MISCELLANEOUS) ×2 IMPLANT
GAUZE SPONGE 4X4 12PLY STRL (GAUZE/BANDAGES/DRESSINGS) ×4 IMPLANT
GOWN STRL REUS W/ TWL LRG LVL3 (GOWN DISPOSABLE) ×10 IMPLANT
GOWN STRL REUS W/ TWL XL LVL3 (GOWN DISPOSABLE) ×4 IMPLANT
HEMOSTAT POWDER SURGIFOAM 1G (HEMOSTASIS) ×6 IMPLANT
HEMOSTAT SURGICEL 2X14 (HEMOSTASIS) ×2 IMPLANT
INSERT FOGARTY XLG (MISCELLANEOUS) ×2 IMPLANT
INSERT SUTURE HOLDER (MISCELLANEOUS) ×2 IMPLANT
KIT BASIN OR (CUSTOM PROCEDURE TRAY) ×2 IMPLANT
KIT DRSG PREVENA PLUS 7DAY 125 (MISCELLANEOUS) IMPLANT
KIT SUCTION CATH 14FR (SUCTIONS) ×2 IMPLANT
KIT TURNOVER KIT B (KITS) ×2 IMPLANT
KIT VASOVIEW HEMOPRO 2 VH 4000 (KITS) ×2 IMPLANT
MARKER GRAFT CORONARY BYPASS (MISCELLANEOUS) ×6 IMPLANT
PACK E OPEN HEART (SUTURE) ×2 IMPLANT
PACK OPEN HEART (CUSTOM PROCEDURE TRAY) ×2 IMPLANT
PAD ARMBOARD POSITIONER FOAM (MISCELLANEOUS) ×4 IMPLANT
PAD ELECT DEFIB RADIOL ZOLL (MISCELLANEOUS) ×2 IMPLANT
PENCIL BUTTON HOLSTER BLD 10FT (ELECTRODE) ×2 IMPLANT
POSITIONER HEAD DONUT 9IN (MISCELLANEOUS) ×2 IMPLANT
PUNCH AORTIC ROTATE 4.0MM (MISCELLANEOUS) ×2 IMPLANT
SET MPS 3-ND DEL (MISCELLANEOUS) IMPLANT
SOLN 0.9% NACL POUR BTL 1000ML (IV SOLUTION) ×10 IMPLANT
SOLN STERILE WATER BTL 1000 ML (IV SOLUTION) ×4 IMPLANT
SOLUTION ANTFG W/FOAM PAD STRL (MISCELLANEOUS) IMPLANT
SUPPORT HEART JANKE-BARRON (MISCELLANEOUS) ×2 IMPLANT
SUT BONE WAX W31G (SUTURE) ×2 IMPLANT
SUT ETHIBOND X763 2 0 SH 1 (SUTURE) ×8 IMPLANT
SUT MNCRL AB 4-0 PS2 18 (SUTURE) IMPLANT
SUT PROLENE 4 0 SH DA (SUTURE) ×2 IMPLANT
SUT PROLENE 4-0 RB1 .5 CRCL 36 (SUTURE) IMPLANT
SUT PROLENE 5 0 C 1 36 (SUTURE) IMPLANT
SUT PROLENE 6 0 C 1 30 (SUTURE) ×4 IMPLANT
SUT PROLENE 7 0 BV 1 (SUTURE) IMPLANT
SUT PROLENE 7 0 BV1 MDA (SUTURE) ×2 IMPLANT
SUT SILK 1 MH (SUTURE) IMPLANT
SUT STEEL 6MS V (SUTURE) ×2 IMPLANT
SUT STEEL STERNAL CCS#1 18IN (SUTURE) IMPLANT
SUT STEEL SZ 6 DBL 3X14 BALL (SUTURE) ×4 IMPLANT
SUT VIC AB 1 CTX36XBRD ANBCTR (SUTURE) ×4 IMPLANT
SUT VIC AB 2-0 CT1 TAPERPNT 27 (SUTURE) IMPLANT
SUT VIC AB 2-0 CTX 27 (SUTURE) IMPLANT
SYSTEM SAHARA CHEST DRAIN ATS (WOUND CARE) ×2 IMPLANT
TAPE CLOTH SURG 4X10 WHT LF (GAUZE/BANDAGES/DRESSINGS) IMPLANT
TAPE PAPER 2X10 WHT MICROPORE (GAUZE/BANDAGES/DRESSINGS) IMPLANT
TOWEL GREEN STERILE (TOWEL DISPOSABLE) ×2 IMPLANT
TOWEL GREEN STERILE FF (TOWEL DISPOSABLE) ×2 IMPLANT
TRAY FOLEY W/BAG SLVR 14FR LF (SET/KITS/TRAYS/PACK) IMPLANT
TUBE CONNECTING 20X1/4 (TUBING) IMPLANT
TUBE SUCT INTRACARD DLP 20F (MISCELLANEOUS) ×2 IMPLANT
TUBE SUCTION CARDIAC 10FR (CANNULA) ×2 IMPLANT
TUBING LAP HI FLOW INSUFFLATIO (TUBING) ×2 IMPLANT
UNDERPAD 30X36 HEAVY ABSORB (UNDERPADS AND DIAPERS) ×2 IMPLANT

## 2024-09-12 NOTE — Brief Op Note (Signed)
 09/12/2024  8:48 AM  PATIENT:  Anna Noble  68 y.o. female  PRE-OPERATIVE DIAGNOSIS:  CORONARY ARTERY DISEASE  POST-OPERATIVE DIAGNOSIS:  CORONARY ARTERY DISEASE  PROCEDURE:  Procedures:  CORONARY ARTERY BYPASS GRAFTING x 2 -LIMA to LAD -RSVG to PDA  ECHOCARDIOGRAM, TRANSESOPHAGEAL, INTRAOPERATIVE (N/A)  ENDOSCOPIC HARVEST GREATER SAPHENOUS VEIN -Right Thigh Vein harvest time: 20 min Vein prep time: 10 min  SURGEON:  Surgeons and Role:    * Su, Con RAMAN, MD - Primary  PHYSICIAN ASSISTANT: Rocky Shad PA-C   ASSISTANTS: Luke Mayotte RNFA   ANESTHESIA:   general  EBL:  400 mL   BLOOD ADMINISTERED:  CC CELLSAVER  DRAINS: Left Pleural Chest Tube, Mediastinal Chest Drain   LOCAL MEDICATIONS USED:  NONE  SPECIMEN:  No Specimen  DISPOSITION OF SPECIMEN:  N/A  COUNTS:  YES  TOURNIQUET:  * No tourniquets in log *  DICTATION: .Dragon Dictation  PLAN OF CARE: Admit to inpatient   PATIENT DISPOSITION:  ICU - intubated and hemodynamically stable.   Delay start of Pharmacological VTE agent (>24hrs) due to surgical blood loss or risk of bleeding: yes

## 2024-09-12 NOTE — Anesthesia Procedure Notes (Addendum)
 Arterial Line Insertion Start/End12/16/2025 6:55 AM, 09/12/2024 7:05 AM Performed by: Claudene Florina Boga, CRNA, CRNA  Patient location: Pre-op. Preanesthetic checklist: patient identified, IV checked, site marked, risks and benefits discussed, surgical consent, monitors and equipment checked, pre-op evaluation, timeout performed and anesthesia consent Lidocaine  1% used for infiltration Left, radial was placed Catheter size: 20 G Hand hygiene performed  and maximum sterile barriers used  Allen's test indicative of satisfactory collateral circulation Attempts: 4

## 2024-09-12 NOTE — Anesthesia Postprocedure Evaluation (Signed)
 Anesthesia Post Note  Patient: Anna Noble  Procedure(s) Performed: CORONARY ARTERY BYPASS GRAFTING TIMES TWO, USING LEFT INTERNAL MAMMARY ARTERY AND ENDOSCOPICALLY HARVESTED RIGHT GREATER SAPHENOUS VEIN (Chest) ECHOCARDIOGRAM, TRANSESOPHAGEAL, INTRAOPERATIVE     Patient location during evaluation: SICU Anesthesia Type: General Level of consciousness: sedated Pain management: pain level controlled Vital Signs Assessment: post-procedure vital signs reviewed and stable Respiratory status: patient remains intubated per anesthesia plan Cardiovascular status: stable Postop Assessment: no apparent nausea or vomiting Anesthetic complications: no   No notable events documented.  Last Vitals:  Vitals:   09/12/24 0706 09/12/24 1347  BP:    Pulse: 82 90  Resp: 18 16  Temp:    SpO2: 97% 99%    Last Pain:  Vitals:   09/12/24 0631  TempSrc:   PainSc: 0-No pain                 Lenzi Marmo,W. EDMOND

## 2024-09-12 NOTE — Progress Notes (Signed)
° °  [address] [place] [address]             [phone]   S/p CABG x 2  Intubated, waking up   BP (!) 141/66   Pulse (!) 106   Temp (!) 97.2 F (36.2 C) (Axillary)   Resp (!) 22   Ht 5' 7 (1.702 m)   Wt 83 kg   SpO2 95%   BMI 28.66 kg/m   Ci 5 by Flow track, CVP 9 Norepi 2   Intake/Output Summary (Last 24 hours) at 09/12/2024 1740 Last data filed at 09/12/2024 1600 Gross per 24 hour  Intake 3855.59 ml  Output 4165 ml  Net -309.41 ml   pCO2 51 down from 56 Hgb 10.2 K 4.7 ionCa2+ 1.19  Extubated when respiratory acidosis improved  Elspeth C. Kerrin, MD [facility] [phone]

## 2024-09-12 NOTE — Procedures (Signed)
 Extubation Procedure Note  Cardiac wean complete.  NIF -24 VC 1.0L Cuff leak + ABG acceptable per CCM.  No Stridor  Pt placed on 4L .   Patient Details:   Name: Anna Noble DOB: 07-06-1956 MRN: [id number]   Airway Documentation:    Vent end date: 09/12/24 Vent end time: 2100   Evaluation  O2 sats: stable throughout Complications: No apparent complications Patient did tolerate procedure well. Bilateral Breath Sounds: Clear, Diminished   Yes  Anna Noble 09/12/2024, 9:02 PM

## 2024-09-12 NOTE — Op Note (Signed)
 Date of Surgery: 09/12/2024   Preop Diagnosis:  Multivessel coronary artery disease Postop Diagnosis: Same Surgeon:  Con GORMAN Clunes, MD Assistant(s):  Rocky Shad PA-C (performed right [facility])  Experienced assistance was necessary for this case due to surgical complexity. Erin assisted by independently harvesting the saphenous vein endoscopically and closing the leg incision. She then assisted with retraction of delicate tissues, exposure, suctioning, and suture management during the anastomosis.  Anesthesia: GET  Procedures: 1. CABG x 2 (SVG - PDA, LIMA - LAD) 2. Endoscopic Vein Harvest   Cross clamp time: 60 min Bypass time: 105 min  Approach:  Median sternotomy Drains: 1 left pleural 28 fr, 1 mediastinal 28 Fr right angle, 1 mediastinal 36 Fr straight Pacing Wires: atrial and ventricular Findings of Procedure: 1. PDA target - good quality 2. LAD target - fair quality  Estimated Blood Loss: 400 mL Blood Products Administered: None Disposition:  ICU Specimens:  none Indication: Ms. Tremain is a pleasant 68 year old woman who presents with multivessel coronary artery disease in the setting of worsening fatigue and increased dyspnea on exertion.  She is active and independent in all of her ADLs.  She has reduced EF (40-45%) and a nonviable apex but the remainder of her LAD distribution including the anterior wall appears viable.    Procedure in Detail:  The risks, benefits, complications, treatment options, and expected outcomes were discussed with the patient. The possibilities of reaction to medication, pulmonary aspiration, perforation of viscus, bleeding, recurrent infection, the need for additional procedures, failure to diagnose a condition, and creating a complication requiring transfusion or operation were discussed with the patient. The patient concurred with the proposed plan, giving informed consent. The patient was taken to the operating room, identified as Andree LITTIE Benjamine and  the procedure verified as Coronary Artery Bypass Grafting. A Time Out was held and the above information confirmed.  The patient was prepped and draped in a sterile fashion. An endoscopic vein harvesting in the right leg was carried out by [employer]. A standard median sternotomy was performed. The left internal mammary artery was taken down using cautery and clips and left pleural chest tube was placed.  Intervenous heparin  was given at the appropriate dose to obtain anticoagulation sufficient for CPB. The LIMA was then transected distally. The flow was excellent.    The pericardium was then opened and the pericardial well was created. The aorta was palpated and free of calcium /plaque.  Concentric purse strings were placed and the aorta was cannulated with a 20 fr EOPA cannula. After adequate de-airing this cannula was connected to the CPB circuit.  A purse string was placed around the right atrial appendage and it was cannulated with a large triple stage venous cannula. Prior to initiating CPB the LIMA was fashioned to the appropriate length.   Cardiopulmonary bypass was then initiated. The distal targets (PDA and LAD)  were then evaluated and marked. The heart basket was then placed into the appropriate position. An antegrade vent/cardioplegia cannula was then placed. The aortic cross clamp was then applied and the heart was arrested with antegrade cardioplegia. Cardioplegia was then administered intermittantly to ensure adequate myocardial protection.   The distal anastomosis to the PDA was completed first. After positioning the heart, the artery was opened and RSV was anastomosed to the artery using 7-0 prolene in a running fashion. The flow was tested and was adequate.   Next the LIMA to LAD anastomosis was completed in a similar fashion with 7-0 prolene.  The proximal anastomosis was then completed in the standard fashion using 6-0 prolene in a running fashion. After adequate deairing  maneuvers, the cross clamp was removed and the heart was reperfused.   Examination of all surgical sites revealed good hemostasis. Atrial and ventricular epicardial pacing wires were then placed. The patient was fully ventillated and successfully weaned off of CPB. After a test dose of protamine , the patient was fully decannulated.  The heparin  was fully reversed with protamine . Again, examination of all surgical sites were evaluated for hemostasis which was good.  Post-bypass TEE revealed preserved RV/LV function and no new wall motion abnormalities.    Mediastinal drains were placed. The sternum was closed using stainless steel wires. The fascia was closed with #1 vicryl and the deep dermal with 2-0 Vicryl. The skin was closed with 3-0 Vicryl. A sterile dressing was applied over the incision.  At the end of the operation, all sponge, instruments, and needle counts were correct.   The patient tolerated the procedure without complication and was transported to the CTICU in stable condition.  Complications: none  Specimen: None  Implants: None

## 2024-09-12 NOTE — Anesthesia Procedure Notes (Signed)
 Central Venous Catheter Insertion Performed by: Epifanio Fallow, MD, anesthesiologist Start/End12/16/2025 7:00 AM, 09/12/2024 7:15 AM Patient location: Pre-op. Preanesthetic checklist: patient identified, IV checked, site marked, risks and benefits discussed, surgical consent, monitors and equipment checked, pre-op evaluation, timeout performed and anesthesia consent Position: Trendelenburg Lidocaine  1% used for infiltration and patient sedated Hand hygiene performed , maximum sterile barriers used  and Seldinger technique used Catheter size: 8.5 Fr Total catheter length 10. Central line was placed.Sheath introducer Procedure performed using ultrasound to evaluate access site. Ultrasound Notes:relevant anatomy identified, ultrasound used to visualize needle entry, vessel patent under ultrasound and image(s) printed for medical record. Attempts: 1 Following insertion, line sutured, dressing applied and Biopatch. Post procedure assessment: blood return through all ports, free fluid flow and no air  Patient tolerated the procedure well with no immediate complications. Additional procedure comments: 3 lumen slick catheter placed through the [facility] port of the introducer.SABRA

## 2024-09-12 NOTE — Transfer of Care (Signed)
 Immediate Anesthesia Transfer of Care Note  Patient: Anna Noble  Procedure(s) Performed: CORONARY ARTERY BYPASS GRAFTING TIMES TWO, USING LEFT INTERNAL MAMMARY ARTERY AND ENDOSCOPICALLY HARVESTED RIGHT GREATER SAPHENOUS VEIN (Chest) ECHOCARDIOGRAM, TRANSESOPHAGEAL, INTRAOPERATIVE  Patient Location: ICU  Anesthesia Type:General  Level of Consciousness: sedated; remains intubated  Airway & Oxygen Therapy: Patient remains intubated per anesthesia plan and Patient placed on Ventilator (see vital sign flow sheet for setting)  Post-op Assessment: Report given to RN and Post -op Vital signs reviewed and stable  Post vital signs: Reviewed and stable  Last Vitals:  Vitals Value Taken Time  BP    Temp    Pulse 96 09/12/24 13:50  Resp 16 09/12/24 13:50  SpO2 100 % 09/12/24 13:50  Vitals shown include unfiled device data.  Last Pain:  Vitals:   09/12/24 0631  TempSrc:   PainSc: 0-No pain         Complications: No notable events documented.

## 2024-09-12 NOTE — Progress Notes (Signed)
 Rapid wean protocol initiated at 1540.  Tolerating well at this time.  Will continue to monitor.

## 2024-09-12 NOTE — Anesthesia Procedure Notes (Signed)
 Procedure Name: Intubation Date/Time: 09/12/2024 7:50 AM  Performed by: Erlene Powell POUR, CRNAPre-anesthesia Checklist: Patient identified, Emergency Drugs available, Suction available and Patient being monitored Patient Re-evaluated:Patient Re-evaluated prior to induction Oxygen Delivery Method: Circle system utilized Preoxygenation: Pre-oxygenation with 100% oxygen Induction Type: IV induction Ventilation: Mask ventilation without difficulty Laryngoscope Size: Miller and 2 Grade View: Grade I Tube type: Oral Tube size: 8.0 mm Number of attempts: 1 Airway Equipment and Method: Stylet Placement Confirmation: ETT inserted through vocal cords under direct vision, positive ETCO2 and breath sounds checked- equal and bilateral Secured at: 22 cm Tube secured with: Tape Dental Injury: Teeth and Oropharynx as per pre-operative assessment

## 2024-09-12 NOTE — Discharge Instructions (Signed)
 Discharge Instructions:  1. You may shower, please wash incisions daily with soap and water  and keep dry.  If you wish to cover wounds with dressing you may do so but please keep clean and change daily.  No tub baths or swimming until incisions have completely healed.  If your incisions become red or develop any drainage please call our office at [phone]  2. No Driving until cleared by Dr. Linward office and you are no longer using narcotic pain medications  3. Monitor your weight daily.. Please use the same scale and weigh at same time... If you gain 5-10 lbs in 48 hours with associated lower extremity swelling, please contact our office at [phone]  4. Fever of 101.5 for at least 24 hours with no source, please contact our office at [phone]  5. Activity- up as tolerated, please walk at least 3 times per day.  Avoid strenuous activity, no lifting, pushing, or pulling with your arms over 8-10 lbs for a minimum of 6 weeks  6. If any questions or concerns arise, please do not hesitate to contact our office at [phone]

## 2024-09-12 NOTE — Progress Notes (Signed)
 Patient placed back on full support ventilation due to results of ABG.  Also increased RR to 20.  Will continue to monitor.

## 2024-09-12 NOTE — Progress Notes (Signed)
 PCCM Brief Note  Asked to check in regarding possible extubation. ABG from earlier with respiratory acidosis. Per RN, TCTS ok to extubate if repeat gas improved and pCO2 < 50.  Repeat ABG: 7.30/42/121. Pt tolerating SBT well and meets criteria for extubation.  Proceed with extubation.

## 2024-09-12 NOTE — Interval H&P Note (Signed)
 History and Physical Interval Note:  09/12/2024 6:08 AM  Anna Noble  has presented today for surgery, with the diagnosis of CAD.  The various methods of treatment have been discussed with the patient and family. After consideration of risks, benefits and other options for treatment, the patient has consented to  Procedures: CORONARY ARTERY BYPASS GRAFTING (CABG) (N/A) ECHOCARDIOGRAM, TRANSESOPHAGEAL, INTRAOPERATIVE (N/A) as a surgical intervention.  The patient's history has been reviewed, patient examined, no change in status, stable for surgery.  I have reviewed the patient's chart and labs.  Questions were answered to the patient's satisfaction.     Con RAMAN Myelle Poteat

## 2024-09-12 NOTE — Consult Note (Signed)
 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   Has been experiencing progressive DOE and fatigue. She was being followed by Dr. Anner who scheduled cardiac catheterization 07/2024 which demonstrated prox RCA 100% stenosis, previous LAD stent 80% stenosis. Echo later 08/25/24 showing LVEF 40-45%, G1DD, RV normal. Referred to TCTS for CABG.   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 rate 18, height 5' 7 (1.702 m), weight 83 kg, SpO2 97%.        Intake/Output Summary (Last 24 hours) at 09/12/2024 1156 Last data filed at 09/12/2024 1147 Gross per 24 hour  Intake 1100 ml  Output 1600 ml  Net -500 ml   Filed Weights   09/12/24 9367  Weight: 83 kg    Examination: General: older female, post op HENT: ncat, perrla, ett, ogt  Lungs: ctab, vented  Cardiovascular: s1s2, +rub, median sternotomy wound vac c/d/I, x3 CT present - sanguinous output  Abdomen: rounded, soft  Extremities: warm, no edema  Neuro: sedated GU: foley, clear urine  Resolved Hospital Problem list    Assessment & Plan:  Multivessel CAD s/p CABG x2 RSVG PDA, LIMA LAD History of MI s/p  stent 2001  HFmrEF - post-op management per TCTS - cbc, bmp now  - con't weaning pressors as able to maintain, SBP 100-120, MAP >65-85. Albumin  boluses prn for low SV.  - avoid acidemia, coagulopathy, hypocalcemia, hypothermia - insulin  gtt for BG 100-180 - complete txa  - tele monitoring/ pacing prn - mediastinal drains per TCTS - multimodal pain control per protocol- oxycodone , tramadol , morphine  with bowel regimen - statin tomorrow  - metoprolol  today  - complete post-op antibiotics - monitor electrolytes, replete PRN  Post bypass vasoplegia - wean vasopressors for MAP 70-90, albumin  prn   Post-operative vent management History of tobacco use, quit 1 year ago, 45 pack years Daily marijuana smoking  - rapid wean per TCTS protocol - VAP/ PPI - CXR/ ABG now - full mechanical vent support - lung protective ventilation 6-8cc/kg Vt - VAP and PAD bundle in place  - titrate FiO2 to sat goal >92  - maintain peak/plats <30, driving pressures <84    AKI  BUN 26, sCr 1.12 ( baseline <1) - watch for cold diuresis - she put out 2L of clear urine in OR - will likely need fluid resusc  - trend   Expected post-operative ABLA Expected post-operative consumptive thrombocytopenia  - trend  - transfuse for hgb <8 or significant bleeding  - correct coagulopathy with significant bleeding    HTN HLD  - statin tomorrow  - wean off pressors first - she is very sensitive to vasopressor use   Diabetes, A1c 7.9 - endotool  for now  - transition possibly tomorrow   Hypothyroidism  - con't home synthroid  25mcg daily   Depression - con't home cymbalta  60mg  daily   Neuropathy - gabapentin  600mg  nightly   Labs   CBC: Recent Labs  Lab 09/08/24 1153 09/12/24 0755 09/12/24 1015 09/12/24 1019 09/12/24 1056 09/12/24 1110 09/12/24 1127  WBC 9.1  --   --   --   --   --   --   HGB 14.1   < > 8.5* 8.8* 7.1* 7.7* 7.1*  HCT 43.4   < > 25.0* 26.0* 21.0* 23.0* 21.0*  MCV 92.3  --   --    --   --   --   --   PLT 219  --   --   --   --  119*  --    < > = values in this interval not displayed.    Basic Metabolic Panel: Recent Labs  Lab 09/08/24 1153 09/12/24 0755 09/12/24 0936 09/12/24 1015 09/12/24 1019 09/12/24 1056 09/12/24 1127  NA 136 139 138 135 137 136 138  K 4.6 4.7 4.0 4.2 3.7 4.3 4.6  CL 98 102 103  --   --  99 101  CO2 28  --   --   --   --   --   --   GLUCOSE 152* 163* 133*  --   --  97 124*  BUN 26* 24* 22  --   --  18 19  CREATININE 1.12* 1.00 0.90  --   --  0.80 0.80  CALCIUM  8.8*  --   --   --   --   --   --    GFR: Estimated Creatinine Clearance: 73.6 mL/min (by C-G formula based on SCr of 0.8 mg/dL). Recent Labs  Lab 09/08/24 1153  WBC 9.1    Liver Function Tests: Recent Labs  Lab 09/08/24 1153  AST 22  ALT 9  ALKPHOS 118  BILITOT 0.5  PROT 7.2  ALBUMIN  3.9   No results for input(s): LIPASE, AMYLASE in the last 168 hours. No results for input(s): AMMONIA in the last 168 hours.  ABG    Component Value Date/Time   PHART 7.417 09/12/2024 1015   PCO2ART 34.6 09/12/2024 1015   PO2ART 367 (H) 09/12/2024 1015   HCO3 23.4 09/12/2024 1019   TCO2 27 09/12/2024 1127   ACIDBASEDEF 1.0 09/12/2024 1019   O2SAT 83 09/12/2024 1019     Coagulation Profile: Recent Labs  Lab 09/08/24 1153  INR 1.0    Cardiac Enzymes: No results for input(s): CKTOTAL, CKMB, CKMBINDEX, TROPONINI in the last 168 hours.  HbA1C: Hemoglobin A1C  Date/Time Value Ref Range Status  01/19/2018 10:40 AM 7.2  Final  09/15/2017 10:53 AM 7.8  Final   Hgb A1c MFr Bld  Date/Time Value Ref Range Status  09/08/2024 11:53 AM 7.9 (H) 4.8 - 5.6 % Final    Comment:    (NOTE) Diagnosis of Diabetes The following HbA1c ranges recommended by the American Diabetes Association (ADA) may be used as an aid in the diagnosis of diabetes mellitus.  Hemoglobin             Suggested A1C NGSP%              Diagnosis  <5.7                   Non  Diabetic  5.7-6.4  Pre-Diabetic  >6.4                   Diabetic  <7.0                   Glycemic control for                       adults with diabetes.    05/26/2017 03:58 PM 6.9 (H) 4.8 - 5.6 % Final    Comment:             Prediabetes: 5.7 - 6.4          Diabetes: >6.4          Glycemic control for adults with diabetes: <7.0     CBG: Recent Labs  Lab 09/08/24 1124 09/12/24 0553  GLUCAP 137* 205*    Review of Systems:   As above   Past Medical History:  She,  has a past medical history of Abnormal Pap smear of cervix, Acute myocardial infarction of other anterior wall, episode of care unspecified, Allergy, Arthritis, CHF (congestive heart failure) (HCC), Coronary atherosclerosis of native coronary artery, Depression, DM (diabetes mellitus) (HCC), Dysmetabolic syndrome X, HLD (hyperlipidemia), HTN (hypertension), Hypothyroidism, Neuromuscular disorder (HCC), Obesity, unspecified, and Tobacco use disorder.   Surgical History:   Past Surgical History:  Procedure Laterality Date   ACHILLES TENDON SURGERY Left    CARDIAC CATHETERIZATION     Cardiac stenting     EYE SURGERY     HAND SURGERY Right    ganglion cyst, bone graft   KNEE ARTHROSCOPY Right 1976   LEFT HEART CATH AND CORONARY ANGIOGRAPHY N/A 08/14/2024   Procedure: LEFT HEART CATH AND CORONARY ANGIOGRAPHY;  Surgeon: Anner Alm ORN, MD;  Location[facility] INVASIVE CV LAB;  Service: Cardiovascular;  Laterality: N/A;   STRABISMUS SURGERY Left 1959   TOE SURGERY Right    x 3   TUBAL LIGATION       Social History:   reports that she has been smoking e-cigarettes and cigarettes. She has a 15 pack-year smoking history. She uses smokeless tobacco. She reports that she does not currently use alcohol. She reports current drug use. Drug: Marijuana.   Family History:  Her family history includes Arthritis in her brother; Cancer in her father; Colon cancer (age of onset: 25) in her maternal aunt; Dementia in  her mother; Depression in her father; Diabetes in her mother and sister; Down syndrome in her son; Heart disease (age of onset: 64) in her brother; Heart disease (age of onset: 34) in her father; Heart disease (age of onset: 93) in her sister; Hypertension in her brother, father, mother, and sister; Multiple sclerosis in her sister; Prostate cancer in her father.   Allergies Allergies[1]   Home Medications  Prior to Admission medications  Medication Sig Start Date End Date Taking? Authorizing Provider  ascorbic acid (VITAMIN C) 500 MG tablet Take 500 mg by mouth daily.   Yes [provider]  bisoprolol-hydrochlorothiazide  (ZIAC) 10-6.25 MG tablet Take 1 tablet by mouth daily.   Yes [provider]  CINNAMON PO Take 1,000 mg by mouth daily.   Yes [provider]  cyanocobalamin (VITAMIN B12) 1000 MCG tablet Take 1,000 mcg by mouth in the morning and at bedtime.   Yes [provider]  cyclobenzaprine  (FLEXERIL ) 5 MG tablet Take 5 mg by mouth at bedtime.   Yes [provider]  dapagliflozin propanediol (FARXIGA) 10 MG TABS tablet  Take 10 mg by mouth daily.   Yes [provider]  doxylamine, Sleep, (UNISOM) 25 MG tablet Take 25 mg by mouth at bedtime.   Yes [provider]  DULoxetine  (CYMBALTA ) 60 MG capsule Take 60 mg by mouth daily.   Yes [provider]  ergocalciferol  (VITAMIN D2) 1.25 MG (50000 UT) capsule Take 50,000 Units by mouth once a week.   Yes [provider]  gabapentin  (NEURONTIN ) 600 MG tablet Take 600 mg by mouth at bedtime.   Yes [provider]  ipratropium (ATROVENT ) 0.03 % nasal spray Place 2 sprays into both nostrils 2 (two) times daily as needed for rhinitis.   Yes [provider]  lansoprazole (PREVACID) 30 MG capsule Take 30 mg by mouth daily.   Yes [provider]  levocetirizine (XYZAL ) 5 MG tablet Take 5 mg by mouth daily.   Yes [provider]   levothyroxine  (SYNTHROID ) 25 MCG tablet Take 25 mcg by mouth daily before breakfast.   Yes [provider]  MAGNESIUM  OXIDE PO Take 500 mg by mouth daily. 03/18/21  Yes [provider]  nitroGLYCERIN  (NITROSTAT ) 0.4 MG SL tablet Place 1 tablet (0.4 mg total) under the tongue every 5 (five) minutes as needed. 05/24/17  Yes Gretta Ozell CROME, PA-C  OZEMPIC, 2 MG/DOSE, 8 MG/3ML SOPN Inject 2 mg into the skin once a week. 06/20/24  Yes [provider]  potassium chloride  SA (KLOR-CON  M) 20 MEQ tablet Take 20 mEq by mouth daily.   Yes [provider]  rosuvastatin  (CRESTOR ) 20 MG tablet Take 20 mg by mouth daily.   Yes [provider]  sacubitril-valsartan (ENTRESTO) 97-103 MG Take 1 tablet by mouth 2 (two) times daily.   Yes [provider]  TURMERIC PO Take 1,000 mg by mouth daily. Complex   Yes [provider]  ACCU-CHEK AVIVA PLUS test strip SMARTSIG:1 Strip(s) Via Meter Every Morning 04/11/21   [provider]  Accu-Chek Softclix Lancets lancets SMARTSIG:Topical 04/11/21   [provider]  albuterol  (PROVENTIL  HFA;VENTOLIN  HFA) 108 (90 Base) MCG/ACT inhaler Inhale 2 puffs into the lungs every 4 (four) hours as needed for wheezing or shortness of breath (cough, shortness of breath or wheezing.). 11/15/17   Starla Grate D, PA-C  Blood Glucose Monitoring Suppl (ACCU-CHEK AVIVA PLUS) w/Device KIT Use 1 kit as directed once a day  to check fasting blood sugar for diabetes 04/11/21   [provider]  clopidogrel  (PLAVIX ) 75 MG tablet Take 1 tablet (75 mg total) by mouth daily. Per Dr Sherrlyn take Plavix  until 08/27/24 then discontinue 08/15/24   Anner Alm ORN, MD  furosemide  (LASIX ) 40 MG tablet Take 40 mg by mouth daily as needed for edema or fluid. 03/10/21   [provider]  levothyroxine  (SYNTHROID , LEVOTHROID) 75 MCG tablet Take 1 tablet (75 mcg total) by mouth daily. Patient not taking: Reported on  09/05/2024 01/19/18   Claudene Rayfield HERO, MD     Critical care time: 58    The patient is critically ill with multiple organ system failure and requires high complexity decision making for assessment and support, frequent evaluation and titration of therapies, advanced monitoring, review of radiographic studies and interpretation of complex data.    Critical Care Time devoted to patient care services, exclusive of separately billable procedures, described in this note is 40  Tinnie FORBES Adolph DEVONNA [facility] 09/12/2024 1:57 PM  Please see [email or web address] for pager details.  From 7A-7P  if no response, please call [phone] After hours, please call ELink [phone]          [1]  Allergies Allergen Reactions   Penicillins Anaphylaxis   Aspirin  Hives   Nsaids Hives   Latex Rash

## 2024-09-12 NOTE — Hospital Course (Addendum)
 History of Present Illness:  Anna Noble is a 68 yo female of HTN, Hyperlipidemia, and diabetes.  She has known CAD from a heart attack in 2001 at which time a stent was placed in the LAD.  The patient developed worsening shortness of breath especially with exertion.  She denied chest pain but was experiencing fatigue.  She is able to complete her house work.  She is able to walk up the stairs but does get short of breath at the top and it was becoming more difficult.  She has history of nicotine  abuse, smoking 1 pack per day for approximately 45 years.  She quit about 1 year ago, but she does smoke marijuana nightly to help with restless leg syndrome.  She underwent repeat catheterization on 08/14/2024 which showed stenosis of 80% in previously placed stent.  There was also a total occlusion of RCA that filled via collaterals.  She was referred to Cardiothoracic surgery for evaluation.  She saw Dr. Daniel who felt she would be a good surgical candidate for coronary bypass grafting.  The risks and benefits of the procedure were explained to the patient and she was agreeable to proceed.  Hospital Course:  Anna Noble presented to [facility] on 09/12/2024.  She was taken to the operating room and underwent Coronary Bypass Grafting x 2 utilizing LIMA to LAD and SVG to PDA.  She also underwent endoscopic harvest of greater saphenous vein from her right thigh.  She tolerated the procedure without difficulty and was taken to the SICU in stable condition.

## 2024-09-13 ENCOUNTER — Inpatient Hospital Stay (HOSPITAL_COMMUNITY)

## 2024-09-13 ENCOUNTER — Encounter (HOSPITAL_COMMUNITY): Payer: Self-pay

## 2024-09-13 LAB — GLUCOSE, CAPILLARY
Glucose-Capillary: 112 mg/dL — ABNORMAL HIGH (ref 70–99)
Glucose-Capillary: 94 mg/dL (ref 70–99)
Glucose-Capillary: 121 mg/dL — ABNORMAL HIGH (ref 70–99)
Glucose-Capillary: 129 mg/dL — ABNORMAL HIGH (ref 70–99)
Glucose-Capillary: 135 mg/dL — ABNORMAL HIGH (ref 70–99)
Glucose-Capillary: 121 mg/dL — ABNORMAL HIGH (ref 70–99)
Glucose-Capillary: 160 mg/dL — ABNORMAL HIGH (ref 70–99)
Glucose-Capillary: 235 mg/dL — ABNORMAL HIGH (ref 70–99)
Glucose-Capillary: 150 mg/dL — ABNORMAL HIGH (ref 70–99)
Glucose-Capillary: 155 mg/dL — ABNORMAL HIGH (ref 70–99)
Glucose-Capillary: 155 mg/dL — ABNORMAL HIGH (ref 70–99)

## 2024-09-13 LAB — CBC
HCT: 28.1 % — ABNORMAL LOW (ref 36.0–46.0)
HCT: 28.3 % — ABNORMAL LOW (ref 36.0–46.0)
Hemoglobin: 9.1 g/dL — ABNORMAL LOW (ref 12.0–15.0)
Hemoglobin: 9 g/dL — ABNORMAL LOW (ref 12.0–15.0)
MCH: 30.6 pg (ref 26.0–34.0)
MCH: 30.5 pg (ref 26.0–34.0)
MCHC: 32.4 g/dL (ref 30.0–36.0)
MCHC: 31.8 g/dL (ref 30.0–36.0)
MCV: 94.6 fL (ref 80.0–100.0)
MCV: 95.9 fL (ref 80.0–100.0)
Platelets: 153 K/uL (ref 150–400)
Platelets: 151 K/uL (ref 150–400)
RBC: 2.97 MIL/uL — ABNORMAL LOW (ref 3.87–5.11)
RBC: 2.95 MIL/uL — ABNORMAL LOW (ref 3.87–5.11)
RDW: 13.7 % (ref 11.5–15.5)
RDW: 14 % (ref 11.5–15.5)
WBC: 11.4 K/uL — ABNORMAL HIGH (ref 4.0–10.5)
WBC: 13.2 K/uL — ABNORMAL HIGH (ref 4.0–10.5)
nRBC: 0 % (ref 0.0–0.2)
nRBC: 0 % (ref 0.0–0.2)

## 2024-09-13 LAB — BASIC METABOLIC PANEL WITH GFR
Anion gap: 9 (ref 5–15)
Anion gap: 9 (ref 5–15)
BUN: 14 mg/dL (ref 8–23)
BUN: 17 mg/dL (ref 8–23)
CO2: 24 mmol/L (ref 22–32)
CO2: 26 mmol/L (ref 22–32)
Calcium: 8.2 mg/dL — ABNORMAL LOW (ref 8.9–10.3)
Calcium: 8.7 mg/dL — ABNORMAL LOW (ref 8.9–10.3)
Chloride: 107 mmol/L (ref 98–111)
Chloride: 104 mmol/L (ref 98–111)
Creatinine, Ser: 0.92 mg/dL (ref 0.44–1.00)
Creatinine, Ser: 1.1 mg/dL — ABNORMAL HIGH (ref 0.44–1.00)
GFR, Estimated: >60 mL/min (ref >60)
GFR, Estimated: 54 mL/min — ABNORMAL LOW (ref >60)
Glucose, Bld: 146 mg/dL — ABNORMAL HIGH (ref 70–99)
Glucose, Bld: 118 mg/dL — ABNORMAL HIGH (ref 70–99)
Potassium: 4.3 mmol/L (ref 3.5–5.1)
Potassium: 4.2 mmol/L (ref 3.5–5.1)
Sodium: 140 mmol/L (ref 135–145)
Sodium: 138 mmol/L (ref 135–145)

## 2024-09-13 LAB — MAGNESIUM
Magnesium: 2.8 mg/dL — ABNORMAL HIGH (ref 1.7–2.4)
Magnesium: 2.5 mg/dL — ABNORMAL HIGH (ref 1.7–2.4)

## 2024-09-13 MED ORDER — INSULIN GLARGINE 100 UNIT/ML ~~LOC~~ SOLN
10.0000 [IU] | Freq: Every day | SUBCUTANEOUS | Status: DC
  Administered 2024-09-13 – 2024-09-18 (×6): 10 [IU] via SUBCUTANEOUS
  Filled 2024-09-13 (×6): qty 0.1

## 2024-09-13 MED ORDER — INSULIN ASPART 100 UNIT/ML IJ SOLN
0.0000 [IU] | INTRAMUSCULAR | Status: DC
  Administered 2024-09-13: 11:00:00 8 [IU] via SUBCUTANEOUS
  Administered 2024-09-13 (×4): 2 [IU] via SUBCUTANEOUS
  Administered 2024-09-14: 07:00:00 4 [IU] via SUBCUTANEOUS
  Administered 2024-09-14: 04:00:00 2 [IU] via SUBCUTANEOUS
  Filled 2024-09-13: qty 8
  Filled 2024-09-13: qty 4
  Filled 2024-09-13 (×5): qty 2

## 2024-09-13 MED FILL — Thrombin (Recombinant) For Soln 20000 Unit: CUTANEOUS | Qty: 1 | Status: AC

## 2024-09-13 NOTE — TOC Initial Note (Addendum)
 Transition of Care [facility]) - Initial/Assessment Note    Patient Details  Name: Anna Noble MRN: [id number] Date of Birth: 01/17/1956  [facility] CM/SW Contact:    Sudie Erminio Deems, RN Phone Number: 09/13/2024, 11:20 AM  Clinical Narrative:  Patient POD-1 CABG. ICM spoke with patient and she provided verbal permission to call son Rockey. ICM spoke with Rockey and he states patient is at home with her ex-husband Spencer. Patient was independent before hospitalization and she does not use any DME in the home. Rockey states patient has a PCP and she does not have any issues with transportation. Rockey reports that his mothers bedroom is upstairs and she has 16 steps to go up. Rockey states post discharge; his father will be in the home and he will spend a couple of days in the home as well. ICM did reach out to staff RN for PT/OT consult. ICM will await PT/OT recommendations. [facility] via TCTS office protocol will follow the patient for home health needs.              Expected Discharge Plan: Home w Home Health Services vs CIR Barriers to Discharge: Continued Medical Work up   Patient Goals and CMS Choice Patient states their goals for this hospitalization and ongoing recovery are:: Plans to return home once stable.  Expected Discharge Plan and Services   Discharge Planning Services: CM Consult Post Acute Care Choice: Home Health Living arrangements for the past 2 months: Post-Acute Facility                   DME Agency: NA  Prior Living Arrangements/Services Living arrangements for the past 2 months: Post-Acute Facility Lives with:: Spouse (Patient and spouse are divorded; however, live together) Patient language and need for interpreter reviewed:: Yes Do you feel safe going back to the place where you live?: Yes      Need for Family Participation in Patient Care: Yes (Comment) Care giver support system in place?: Yes (comment)   Criminal Activity/Legal Involvement  Pertinent to Current Situation/Hospitalization: No - Comment as needed  Activities of Daily Living      Permission Sought/Granted Permission sought to share information with : Case Manager, Family Supports   Emotional Assessment Appearance:: Appears stated age Attitude/Demeanor/Rapport: Unable to Assess Affect (typically observed): Unable to Assess Orientation: : Oriented to Self Alcohol / Substance Use: Not Applicable Psych Involvement: No (comment)  Admission diagnosis:  Coronary artery disease involving native coronary artery of native heart without angina pectoris [I25.10] S/P CABG x 2 [Z95.1] Patient Active Problem List   Diagnosis Date Noted   S/P CABG x 2 09/12/2024   Abnormal pharmacologic myocardial perfusion study 08/07/2024   DOE (dyspnea on exertion) 08/07/2024   Type 2 diabetes mellitus with vascular disease (HCC) 01/03/2020   Acute on chronic systolic (congestive) heart failure (HCC) 01/03/2020   Chronic heart failure with mildly reduced ejection fraction (HFmrEF) (HCC) 01/02/2020   Hyperlipidemia with target low density lipoprotein (LDL) cholesterol less than 55 mg/dL 97/93/7981   Thyroid  activity decreased 03/20/2014   Fibromyalgia 03/20/2014   Depression 03/20/2014   TOBACCO USER 02/21/2010   Diabetes (HCC) 02/19/2010   METABOLIC SYNDROME X 02/19/2010   Essential hypertension, benign 02/19/2010   History of ST elevation myocardial infarction (STEMI) of anterior wall (HCC) 02/19/2010   Coronary artery disease involving native coronary artery with angina pectoris = manifested as exertional dyspnea 02/19/2010   PCP:  Waddell Palma, PA-C Pharmacy:   [facility]  Mail Delivery - [place], [place] - [address] [place] [address] Phone: [phone] Fax: [phone]  [id number] [place], [place] - [address] AT [facility] [address] [place] [address] Phone: [phone] Fax:  [phone]  Social Drivers of Health (SDOH) Social History: SDOH Screenings   Tobacco Use: High Risk (09/12/2024)   SDOH Interventions:     Readmission Risk Interventions     No data to display

## 2024-09-13 NOTE — Progress Notes (Signed)
 1 Day Post-Op Procedures (LRB): CORONARY ARTERY BYPASS GRAFTING TIMES TWO, USING LEFT INTERNAL MAMMARY ARTERY AND ENDOSCOPICALLY HARVESTED RIGHT GREATER SAPHENOUS VEIN (N/A) ECHOCARDIOGRAM, TRANSESOPHAGEAL, INTRAOPERATIVE (N/A) Subjective: Doing well this morning, sitting up in chair, a little drowsy Hemodynamics stable, no pressors  Objective: Vital signs in last 24 hours: BP 101/66   Pulse 99   Temp (!) 100.8 F (38.2 C) (Axillary)   Resp 20   Ht 5' 7 (1.702 m)   Wt 79.5 kg   SpO2 97%   BMI 27.45 kg/m  Filed Weights   09/12/24 9367 09/13/24 0559  Weight: 83 kg 79.5 kg    Hemodynamic parameters for last 24 hours: CVP:  [2 mmHg-37 mmHg] 11 mmHg CO:  [4.7 L/min-11.3 L/min] 6.9 L/min CI:  [2.4 L/min/m2-5.8 L/min/m2] 3.5 L/min/m2  Intake/Output from previous day: 12/16 0701 - 12/17 0700 In: 6329.4 [I.V.:2622; Blood:228; IV Piggyback:3479.4] Out: 5435 [Urine:4395; Blood:400; Chest Tube:640] Intake/Output this shift: Total I/O In: 82.7 [I.V.:82.7] Out: 70 [Chest Tube:70]  Physical Exam: General - Resting comfortably in chair; drowsy but easily arousable CV - RRR Resp - Unlabored on Parker Abd - Soft, ND/NT Ext - Mild edema  Lab Results:    Latest Ref Rng & Units 09/13/2024    4:10 AM 09/12/2024    8:35 PM 09/12/2024    7:51 PM  CBC  WBC 4.0 - 10.5 K/uL 11.4   8.6   Hemoglobin 12.0 - 15.0 g/dL 9.1  7.5  8.5   Hematocrit 36.0 - 46.0 % 28.1  22.0  26.5   Platelets 150 - 400 K/uL 153   127       Latest Ref Rng & Units 09/13/2024    4:10 AM 09/12/2024    8:35 PM 09/12/2024    7:51 PM  CMP  Glucose 70 - 99 mg/dL 853   867   BUN 8 - 23 mg/dL 14   15   Creatinine 9.55 - 1.00 mg/dL 9.07   9.09   Sodium 864 - 145 mmol/L 140  145  142   Potassium 3.5 - 5.1 mmol/L 4.3  3.9  4.4   Chloride 98 - 111 mmol/L 107   108   CO2 22 - 32 mmol/L 24   25   Calcium  8.9 - 10.3 mg/dL 8.2   8.0     CXR: No large effusion or pneumothorax  Assessment/Plan: S/P Procedures  (LRB): CORONARY ARTERY BYPASS GRAFTING TIMES TWO, USING LEFT INTERNAL MAMMARY ARTERY AND ENDOSCOPICALLY HARVESTED RIGHT GREATER SAPHENOUS VEIN (N/A) ECHOCARDIOGRAM, TRANSESOPHAGEAL, INTRAOPERATIVE (N/A) POD1 CABG x 2, doing well NEURO- intact  Pain control PRN CV- in SR around 100 bpm             Cap pacing wires  Remove arterial line RESP- Continued improved lung aeration             Continue IS, pulm hygiene, ambulation  Keep Chest tubes RENAL- creatinine and lytes Ok  Weight down from pre-op             No lasix  for today  Foley will stay GI- tolerating clears             Advance diet  BM: none since surgery Endo- BG well controlled Transition to ISS ID- PPX DVT ppx - SCD  Dispo: ICU   LOS: 1 day    Con Anna Noble Needs 09/13/2024

## 2024-09-13 NOTE — Plan of Care (Signed)
°  Problem: Clinical Measurements: Goal: Ability to maintain clinical measurements within normal limits will improve Outcome: Progressing Goal: Will remain free from infection Outcome: Progressing Goal: Respiratory complications will improve Outcome: Progressing   Problem: Skin Integrity: Goal: Risk for impaired skin integrity will decrease Outcome: Progressing   Problem: Activity: Goal: Risk for activity intolerance will decrease Outcome: Progressing   Problem: Cardiac: Goal: Will achieve and/or maintain hemodynamic stability Outcome: Progressing   Problem: Respiratory: Goal: Respiratory status will improve Outcome: Progressing

## 2024-09-13 NOTE — Consult Note (Signed)
 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   Has been experiencing progressive DOE and fatigue. She was being followed by Dr. Anner who scheduled cardiac catheterization 07/2024 which demonstrated prox RCA 100% stenosis, previous LAD stent 80% stenosis. Echo later 08/25/24 showing LVEF 40-45%, G1DD, RV normal. Referred to [facility] for CABG.   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 Oob in chair this morning. Doing okay. On 3L [place]. Pain controlled. Will cap wires, a line out, transition off insulin  gtt. No lasix  yet. No DVT ppx yet.   Objective   Blood pressure (!) 114/54, pulse (!) 104, temperature (!) 100.8 F (38.2 C), temperature source Axillary, resp. rate 20, height 5' 7 (1.702 m), weight 79.5 kg, SpO2 95%. CVP:  [2 mmHg-37 mmHg] 12 mmHg CO:  [4.7 L/min-11.3 L/min] 7.4 L/min CI:  [2.4 L/min/m2-5.8 L/min/m2] 3.8 L/min/m2  Vent Mode: CPAP;PSV FiO2 (%):  [40 %-50 %] 40 % Set Rate:  [4 bmp-20 bmp] 4 bmp Vt Set:  [490 mL] 490 mL PEEP:  [5 cmH20] 5 cmH20 Pressure Support:  [10 cmH20] 10 cmH20 Plateau Pressure:  [16 cmH20] 16 cmH20   Intake/Output Summary (Last 24 hours) at 09/13/2024 9290 Last data filed at 09/13/2024  0600 Gross per 24 hour  Intake 6329.4 ml  Output 5435 ml  Net 894.4 ml   Filed Weights   09/12/24 9367 09/13/24 0559  Weight: 83 kg 79.5 kg    Examination: General: older female, sitting oob in chair, no distress  HENT: ncat, perrla, nasal cannula, mmm  Lungs: ctab,diminished bases, 3LNC Cardiovascular: s1s2, no rub, median sternotomy dressing cdi, medial CT with sanguinous output  Abdomen: rounded, soft  Extremities: warm, no edema  Neuro: awake, mildly drowsy, oriented, non focal  GU: foley, clear urine  Resolved Hospital Problem list   Post op vent management  Post bypass vasoplegia  AKI Assessment & Plan:  Multivessel CAD s/p CABG x2 RSVG PDA, LIMA LAD History of MI s/p stent 2001  HFmrEF - post-op management per [facility] - pressors now off  - avoid acidemia, coagulopathy, hypocalcemia, hypothermia - transition off insulin  gtt to ssi  - cap pacing wires, remove aline  - no lasix  today  - no dvt ppx yet  - mediastinal drains per [facility] - multimodal pain control per protocol- oxycodone , tramadol , morphine  with bowel regimen  History of tobacco use, quit 1 year ago, 45 pack years Daily marijuana smoking  - extubated 12/16 night  - titrate o2 to maintain sat >92%   - mobilize and IS   Expected post-operative ABLA Expected post-operative consumptive thrombocytopenia  - trend, stable  - transfuse for hgb <8 or significant bleeding  - correct coagulopathy with significant bleeding    HTN HLD  - statin  daily - metoprolol  BID  Diabetes, A1c 7.9 - transition off insulin  ggt  - ssi  - lantus  10U daily   Hypothyroidism  - con't home synthroid  25mcg daily   Depression - con't home cymbalta  60mg  daily   Neuropathy - gabapentin  600mg  nightly   Labs   CBC: Recent Labs  Lab 09/08/24 1153 09/12/24 0755 09/12/24 1110 09/12/24 1127 09/12/24 1356 09/12/24 1623 09/12/24 1725 09/12/24 1951 09/12/24 2035 09/13/24 0410  WBC 9.1  --   --   --  9.3  --   --  8.6   --  11.4*  HGB 14.1   < > 7.7*   < > 9.3* 10.2* 8.2* 8.5* 7.5* 9.1*  HCT 43.4   < > 23.0*   < > 28.1* 30.0* 24.0* 26.5* 22.0* 28.1*  MCV 92.3  --   --   --  92.7  --   --  94.3  --  94.6  PLT 219  --  119*  --  126*  --   --  127*  --  153   < > = values in this interval not displayed.    Basic Metabolic Panel: Recent Labs  Lab 09/08/24 1153 09/12/24 0755 09/12/24 1056 09/12/24 1127 09/12/24 1214 09/12/24 1217 09/12/24 1355 09/12/24 1623 09/12/24 1725 09/12/24 1951 09/12/24 2035 09/13/24 0410  NA 136   < > 136 138   < > 139   < > 142 143 142 145 140  K 4.6   < > 4.3 4.6   < > 3.9   < > 4.7 4.4 4.4 3.9 4.3  CL 98   < > 99 101  --  103  --   --   --  108  --  107  CO2 28  --   --   --   --   --   --   --   --  25  --  24  GLUCOSE 152*   < > 97 124*  --  159*  --   --   --  132*  --  146*  BUN 26*   < > 18 19  --  19  --   --   --  15  --  14  CREATININE 1.12*   < > 0.80 0.80  --  0.80  --   --   --  0.90  --  0.92  CALCIUM  8.8*  --   --   --   --   --   --   --   --  8.0*  --  8.2*  MG  --   --   --   --   --   --   --   --   --  3.2*  --  2.8*   < > = values in this interval not displayed.   GFR: Estimated Creatinine Clearance: 62.7 mL/min (by C-G formula based on SCr of 0.92 mg/dL). Recent Labs  Lab 09/08/24 1153 09/12/24 1356 09/12/24 1951 09/13/24 0410  WBC 9.1 9.3 8.6 11.4*    Liver Function Tests: Recent Labs  Lab 09/08/24 1153  AST 22  ALT 9  ALKPHOS 118  BILITOT 0.5  PROT 7.2  ALBUMIN  3.9   No results for input(s): LIPASE, AMYLASE in the last 168 hours. No results for input(s): AMMONIA in the last 168 hours.  ABG    Component Value Date/Time   PHART 7.307 (L) 09/12/2024 2035   PCO2ART 42.3 09/12/2024  2035   PO2ART 121 (H) 09/12/2024 2035   HCO3 21.1 09/12/2024 2035   TCO2 22 09/12/2024 2035   ACIDBASEDEF 5.0 (H) 09/12/2024 2035   O2SAT 98 09/12/2024 2035     Coagulation Profile: Recent Labs  Lab 09/08/24 1153 09/12/24 1356  INR  1.0 1.4*    Cardiac Enzymes: No results for input(s): CKTOTAL, CKMB, CKMBINDEX, TROPONINI in the last 168 hours.  HbA1C: Hemoglobin A1C  Date/Time Value Ref Range Status  01/19/2018 10:40 AM 7.2  Final  09/15/2017 10:53 AM 7.8  Final   Hgb A1c MFr Bld  Date/Time Value Ref Range Status  09/08/2024 11:53 AM 7.9 (H) 4.8 - 5.6 % Final    Comment:    (NOTE) Diagnosis of Diabetes The following HbA1c ranges recommended by the American Diabetes Association (ADA) may be used as an aid in the diagnosis of diabetes mellitus.  Hemoglobin             Suggested A1C NGSP%              Diagnosis  <5.7                   Non Diabetic  5.7-6.4                Pre-Diabetic  >6.4                   Diabetic  <7.0                   Glycemic control for                       adults with diabetes.    05/26/2017 03:58 PM 6.9 (H) 4.8 - 5.6 % Final    Comment:             Prediabetes: 5.7 - 6.4          Diabetes: >6.4          Glycemic control for adults with diabetes: <7.0     CBG: Recent Labs  Lab 09/13/24 0130 09/13/24 0312 09/13/24 0408 09/13/24 0528 09/13/24 0606  GLUCAP 112* 94 121* 129* 135*    Review of Systems:   As above   Past Medical History:  She,  has a past medical history of Abnormal Pap smear of cervix, Acute myocardial infarction of other anterior wall, episode of care unspecified, Allergy, Arthritis, CHF (congestive heart failure) (HCC), Coronary atherosclerosis of native coronary artery, Depression, DM (diabetes mellitus) (HCC), Dysmetabolic syndrome X, HLD (hyperlipidemia), HTN (hypertension), Hypothyroidism, Neuromuscular disorder (HCC), Obesity, unspecified, and Tobacco use disorder.   Surgical History:   Past Surgical History:  Procedure Laterality Date   ACHILLES TENDON SURGERY Left    CARDIAC CATHETERIZATION     Cardiac stenting     EYE SURGERY     HAND SURGERY Right    ganglion cyst, bone graft   KNEE ARTHROSCOPY Right 1976   LEFT HEART  CATH AND CORONARY ANGIOGRAPHY N/A 08/14/2024   Procedure: LEFT HEART CATH AND CORONARY ANGIOGRAPHY;  Surgeon: Anner Alm ORN, MD;  Location: [facility] INVASIVE CV LAB;  Service: Cardiovascular;  Laterality: N/A;   STRABISMUS SURGERY Left 1959   TOE SURGERY Right    x 3   TUBAL LIGATION       Social History:   reports that she has been smoking e-cigarettes and cigarettes. She has a 15 pack-year smoking history. She uses smokeless tobacco. She reports that she  does not currently use alcohol. She reports current drug use. Drug: Marijuana.   Family History:  Her family history includes Arthritis in her brother; Cancer in her father; Colon cancer (age of onset: 35) in her maternal aunt; Dementia in her mother; Depression in her father; Diabetes in her mother and sister; Down syndrome in her son; Heart disease (age of onset: 74) in her brother; Heart disease (age of onset: 40) in her father; Heart disease (age of onset: 72) in her sister; Hypertension in her brother, father, mother, and sister; Multiple sclerosis in her sister; Prostate cancer in her father.   Allergies Allergies[1]   Home Medications  Prior to Admission medications  Medication Sig Start Date End Date Taking? Authorizing Provider  ascorbic acid (VITAMIN C) 500 MG tablet Take 500 mg by mouth daily.   Yes [provider]  bisoprolol -hydrochlorothiazide  (ZIAC) 10-6.25 MG tablet Take 1 tablet by mouth daily.   Yes [provider]  CINNAMON PO Take 1,000 mg by mouth daily.   Yes [provider]  cyanocobalamin (VITAMIN B12) 1000 MCG tablet Take 1,000 mcg by mouth in the morning and at bedtime.   Yes [provider]  cyclobenzaprine  (FLEXERIL ) 5 MG tablet Take 5 mg by mouth at bedtime.   Yes [provider]  dapagliflozin propanediol (FARXIGA) 10 MG TABS tablet Take 10 mg by mouth daily.   Yes [provider]  doxylamine, Sleep, (UNISOM) 25 MG tablet Take 25 mg by mouth at bedtime.   Yes  [provider]  DULoxetine  (CYMBALTA ) 60 MG capsule Take 60 mg by mouth daily.   Yes [provider]  ergocalciferol  (VITAMIN D2) 1.25 MG (50000 UT) capsule Take 50,000 Units by mouth once a week.   Yes [provider]  gabapentin  (NEURONTIN ) 600 MG tablet Take 600 mg by mouth at bedtime.   Yes [provider]  ipratropium (ATROVENT ) 0.03 % nasal spray Place 2 sprays into both nostrils 2 (two) times daily as needed for rhinitis.   Yes [provider]  lansoprazole (PREVACID) 30 MG capsule Take 30 mg by mouth daily.   Yes [provider]  levocetirizine (XYZAL ) 5 MG tablet Take 5 mg by mouth daily.   Yes [provider]  levothyroxine  (SYNTHROID ) 25 MCG tablet Take 25 mcg by mouth daily before breakfast.   Yes [provider]  MAGNESIUM  OXIDE PO Take 500 mg by mouth daily. 03/18/21  Yes [provider]  nitroGLYCERIN  (NITROSTAT ) 0.4 MG SL tablet Place 1 tablet (0.4 mg total) under the tongue every 5 (five) minutes as needed. 05/24/17  Yes Gretta Ozell CROME, PA-C  OZEMPIC, 2 MG/DOSE, 8 MG/3ML SOPN Inject 2 mg into the skin once a week. 06/20/24  Yes [provider]  potassium chloride  SA (KLOR-CON  M) 20 MEQ tablet Take 20 mEq by mouth daily.   Yes [provider]  rosuvastatin  (CRESTOR ) 20 MG tablet Take 20 mg by mouth daily.   Yes [provider]  sacubitril-valsartan (ENTRESTO) 97-103 MG Take 1 tablet by mouth 2 (two) times daily.   Yes [provider]  TURMERIC PO Take 1,000 mg by mouth daily. Complex   Yes [provider]  ACCU-CHEK AVIVA PLUS test strip SMARTSIG:1 Strip(s) Via Meter Every Morning 04/11/21   [provider]  Accu-Chek Softclix Lancets lancets SMARTSIG:Topical 04/11/21   [provider]  albuterol  (PROVENTIL  HFA;VENTOLIN  HFA) 108 (90 Base) MCG/ACT inhaler Inhale 2 puffs into the lungs every 4 (four) hours as needed  for wheezing or shortness of  breath (cough, shortness of breath or wheezing.). 11/15/17   Starla Grate D, PA-C  Blood Glucose Monitoring Suppl (ACCU-CHEK AVIVA PLUS) w/Device KIT Use 1 kit as directed once a day  to check fasting blood sugar for diabetes 04/11/21   [provider]  clopidogrel  (PLAVIX ) 75 MG tablet Take 1 tablet (75 mg total) by mouth daily. Per Dr Sherrlyn take Plavix  until 08/27/24 then discontinue 08/15/24   Anner Alm ORN, MD  furosemide  (LASIX ) 40 MG tablet Take 40 mg by mouth daily as needed for edema or fluid. 03/10/21   [provider]  levothyroxine  (SYNTHROID , LEVOTHROID) 75 MCG tablet Take 1 tablet (75 mcg total) by mouth daily. Patient not taking: Reported on 09/05/2024 01/19/18   Claudene Rayfield HERO, MD     Critical care time: 22    The patient is critically ill with multiple organ system failure and requires high complexity decision making for assessment and support, frequent evaluation and titration of therapies, advanced monitoring, review of radiographic studies and interpretation of complex data.    Critical Care Time devoted to patient care services, exclusive of separately billable procedures, described in this note is 32  Tinnie FORBES Adolph DEVONNA [facility] 09/13/2024 7:09 AM  Please see [email or web address] for pager details.  From 7A-7P if no response, please call [phone] After hours, please call ELink [phone]           [1]  Allergies Allergen Reactions   Penicillins Anaphylaxis   Aspirin  Hives   Nsaids Hives   Latex Rash

## 2024-09-13 NOTE — Evaluation (Signed)
 Physical Therapy Evaluation Patient Details Name: Anna Noble MRN: [id number] DOB: 1956/01/24 Today's Date: 09/13/2024  History of Present Illness  Pt is a 68 y.o female admitted 12/16 for CABG x2. PMH: HTN, HLD, hypothyroidism, DM, MI, stent LAD, CAD, HFmrEF, dysmetabolic syndrome X, neuromuscular disorder  Clinical Impression  Pt presents with condition above and deficits mentioned below, see PT Problem List. PTA, she was independent without DME, living with her significant other and x2 sons in a 2-level house with 9 STE. Her bedroom is upstairs but she can stay on the main level of the house if needed. Currently, the pt is limited by sternum pain. Despite the pain, she is motivated to participate and improve though. She displays deficits in power, gross strength, balance, and activity tolerance and is at risk for falls. She is currently needing up to modAx2 to transition supine to sit, minAx2 to transfer to stand, and minAx2 to ambulate up to ~22 ft with an Elyn walker before needing to sit. She tends to sway posteriorly when standing and displays poor management of the [facility] walker. She has had a drastic functional decline, has good family support, and is motivated to participate and improve and therefore could greatly benefit from intensive inpatient rehab, > 3 hours/day. Will continue to follow acutely.      If plan is discharge home, recommend the following: A lot of help with walking and/or transfers;A lot of help with bathing/dressing/bathroom;Assistance with cooking/housework;Assist for transportation;Help with stairs or ramp for entrance   Can travel by private vehicle        Equipment Recommendations BSC/3in1  Recommendations for Other Services  Rehab consult    Functional Status Assessment Patient has had a recent decline in their functional status and demonstrates the ability to make significant improvements in function in a reasonable and predictable amount of time.      Precautions / Restrictions Precautions Precautions: Fall;Sternal;Other (comment) Precaution Booklet Issued: No Recall of Precautions/Restrictions: Impaired Precaution/Restrictions Comments: wound vac chest; Y chest tube; watch SpO2; reviewed precautions Restrictions Weight Bearing Restrictions Per Provider Order: Yes Other Position/Activity Restrictions: sternal precautions      Mobility  Bed Mobility Overal bed mobility: Needs Assistance Bed Mobility: Supine to Sit     Supine to sit: Mod assist, +2 for physical assistance, +2 for safety/equipment, HOB elevated     General bed mobility comments: Pt able to bring her bil legs off EOB and pivoted her hips towards EOB, but needed modx2 to lift her trunk and scoot hips to square them with EOB    Transfers Overall transfer level: Needs assistance Equipment used:  Winfred walker) Transfers: Sit to/from Stand Sit to Stand: Min assist, +2 physical assistance, +2 safety/equipment           General transfer comment: Pt demonstrated good initiation through her legs to stand from EOB, needing minAx2 to boost up to stand and gain balance due to noted posterior sway    Ambulation/Gait Ambulation/Gait assistance: Min assist, +2 physical assistance, +2 safety/equipment Gait Distance (Feet): 22 Feet Assistive device: Elyn Finder Gait Pattern/deviations: Step-through pattern, Decreased step length - right, Decreased step length - left, Decreased stride length, Trunk flexed Gait velocity: reduced Gait velocity interpretation: <1.31 ft/sec, indicative of household ambulator   General Gait Details: Pt needed repeated cues to stand upright rather than flex at trunk and elevate shoulders. She takes slow, small, unsteady steps while using the [facility] walker, needing minA to balance and additional +2 minA to guide and drive  the Office Manager     Tilt Bed    Modified Rankin (Stroke Patients Only)        Balance Overall balance assessment: Needs assistance Sitting-balance support: No upper extremity supported, Feet supported Sitting balance-Leahy Scale: Fair Sitting balance - Comments: static sitting EOB with CGA for safety   Standing balance support: Bilateral upper extremity supported, Reliant on assistive device for balance, During functional activity Standing balance-Leahy Scale: Poor Standing balance comment: reliant on UE support and minA, posterior sway noted                             Pertinent Vitals/Pain Pain Assessment Pain Assessment: Faces Faces Pain Scale: Hurts even more Pain Location: sternum Pain Descriptors / Indicators: Discomfort, Grimacing, Guarding, Operative site guarding, Sore Pain Intervention(s): Limited activity within patient's tolerance, Monitored during session, Repositioned    Home Living Family/patient expects to be discharged to:: Private residence Living Arrangements: Spouse/significant other;Children (x2 sons) Available Help at Discharge: Family;Available 24 hours/day Type of Home: House Home Access: Stairs to enter Entrance Stairs-Rails: Right;Left Entrance Stairs-Number of Steps: 9 Alternate Level Stairs-Number of Steps: flight Home Layout: Two level;Able to live on main level with bedroom/bathroom;Bed/bath upstairs;Full bath on main level Home Equipment: Rolling Walker (2 wheels);Cane - single point;Shower seat;Grab bars - tub/shower;Wheelchair - manual      Prior Function Prior Level of Function : Independent/Modified Independent;Driving             Mobility Comments: No AD       Extremity/Trunk Assessment   Upper Extremity Assessment Upper Extremity Assessment: Defer to OT evaluation    Lower Extremity Assessment Lower Extremity Assessment: Generalized weakness (noted with functional mobility)    Cervical / Trunk Assessment Cervical / Trunk Assessment: Other exceptions Cervical / Trunk Exceptions: sternal  precautions  Communication   Communication Communication: No apparent difficulties    Cognition Arousal: Alert Behavior During Therapy: WFL for tasks assessed/performed   PT - Cognitive impairments: No apparent impairments                       PT - Cognition Comments: able to express her needs to turn back to rest, indicating some safety awareness Following commands: Impaired Following commands impaired: Follows multi-step commands with increased time     Cueing Cueing Techniques: Verbal cues, Tactile cues     General Comments General comments (skin integrity, edema, etc.): VSS on 4L    Exercises     Assessment/Plan    PT Assessment Patient needs continued PT services  PT Problem List Decreased strength;Decreased activity tolerance;Decreased balance;Decreased mobility;Decreased knowledge of use of DME;Decreased knowledge of precautions;Cardiopulmonary status limiting activity;Pain       PT Treatment Interventions DME instruction;Gait training;Stair training;Therapeutic activities;Functional mobility training;Therapeutic exercise;Neuromuscular re-education;Balance training;Patient/family education    PT Goals (Current goals can be found in the Care Plan section)  Acute Rehab PT Goals Patient Stated Goal: to improve PT Goal Formulation: With patient Time For Goal Achievement: 09/27/24 Potential to Achieve Goals: Good    Frequency Min 3X/week     Co-evaluation               AM-PAC PT 6 Clicks Mobility  Outcome Measure Help needed turning from your back to your side while in a flat bed without using bedrails?: A Lot Help needed moving from lying on  your back to sitting on the side of a flat bed without using bedrails?: Total Help needed moving to and from a bed to a chair (including a wheelchair)?: Total Help needed standing up from a chair using your arms (e.g., wheelchair or bedside chair)?: Total Help needed to walk in hospital room?: Total Help  needed climbing 3-5 steps with a railing? : Total 6 Click Score: 7    End of Session Equipment Utilized During Treatment: Oxygen Activity Tolerance: Patient tolerated treatment well;Patient limited by fatigue;Patient limited by pain Patient left: in chair;with call bell/phone within reach (RN reported no need for chair alarm) Nurse Communication: Mobility status (RN present throughout session) PT Visit Diagnosis: Unsteadiness on feet (R26.81);Other abnormalities of gait and mobility (R26.89);Muscle weakness (generalized) (M62.81);Difficulty in walking, not elsewhere classified (R26.2);Pain Pain - Right/Left:  (sternum) Pain - part of body:  (sternum)    Time: 8460-8443 PT Time Calculation (min) (ACUTE ONLY): 17 min   Charges:   PT Evaluation $PT Eval Moderate Complexity: 1 Mod   PT General Charges $$ ACUTE PT VISIT: 1 Visit         Theo Ferretti, PT, DPT Acute Rehabilitation Services  Office: [phone]   Theo CHRISTELLA Ferretti 09/13/2024, 5:30 PM

## 2024-09-13 NOTE — Discharge Summary (Incomplete)
 [id number] [place] [address]             [phone]        Physician Discharge Summary  Patient ID: Anna Noble MRN: [id number] DOB/AGE: Dec 28, 1955 68 y.o.  Admit date: 09/12/2024 Discharge date: 09/13/2024  Admission Diagnoses:  Patient Active Problem List   Diagnosis Date Noted   S/P CABG x 2 09/12/2024   Abnormal pharmacologic myocardial perfusion study 08/07/2024   DOE (dyspnea on exertion) 08/07/2024   Type 2 diabetes mellitus with vascular disease (HCC) 01/03/2020   Acute on chronic systolic (congestive) heart failure (HCC) 01/03/2020   Chronic heart failure with mildly reduced ejection fraction (HFmrEF) (HCC) 01/02/2020   Hyperlipidemia with target low density lipoprotein (LDL) cholesterol less than 55 mg/dL 97/93/7981   Thyroid  activity decreased 03/20/2014   Fibromyalgia 03/20/2014   Depression 03/20/2014   TOBACCO USER 02/21/2010   Diabetes (HCC) 02/19/2010   METABOLIC SYNDROME X 02/19/2010   Essential hypertension, benign 02/19/2010   History of ST elevation myocardial infarction (STEMI) of anterior wall (HCC) 02/19/2010   Coronary artery disease involving native coronary artery with angina pectoris = manifested as exertional dyspnea 02/19/2010     Discharge Diagnoses:  Patient Active Problem List   Diagnosis Date Noted   S/P CABG x 2 09/12/2024   Abnormal pharmacologic myocardial perfusion study 08/07/2024   DOE (dyspnea on exertion) 08/07/2024   Type 2 diabetes mellitus with vascular disease (HCC) 01/03/2020   Acute on chronic systolic (congestive) heart failure (HCC) 01/03/2020   Chronic heart failure with mildly reduced ejection fraction (HFmrEF) (HCC) 01/02/2020   Hyperlipidemia with target low density lipoprotein (LDL) cholesterol less than 55 mg/dL 97/93/7981   Thyroid  activity decreased 03/20/2014   Fibromyalgia 03/20/2014   Depression 03/20/2014   TOBACCO USER 02/21/2010   Diabetes (HCC) 02/19/2010    METABOLIC SYNDROME X 02/19/2010   Essential hypertension, benign 02/19/2010   History of ST elevation myocardial infarction (STEMI) of anterior wall (HCC) 02/19/2010   Coronary artery disease involving native coronary artery with angina pectoris = manifested as exertional dyspnea 02/19/2010     Discharged Condition: {condition:18240}  History of Present Illness:  Anna Noble is a 68 yo female of HTN, Hyperlipidemia, and diabetes.  She has known CAD from a heart attack in 2001 at which time a stent was placed in the LAD.  The patient developed worsening shortness of breath especially with exertion.  She denied chest pain but was experiencing fatigue.  She is able to complete her house work.  She is able to walk up the stairs but does get short of breath at the top and it was becoming more difficult.  She has history of nicotine  abuse, smoking 1 pack per day for approximately 45 years.  She quit about 1 year ago, but she does smoke marijuana nightly to help with restless leg syndrome.  She underwent repeat catheterization on 08/14/2024 which showed stenosis of 80% in previously placed stent.  There was also a total occlusion of RCA that filled via collaterals.  She was referred to Cardiothoracic surgery for evaluation.  She saw Dr. Daniel who felt she would be a good surgical candidate for coronary bypass grafting.  The risks and benefits of the procedure were explained to the patient and she was agreeable to proceed.  Hospital Course:  Anna Noble presented to [facility] on 09/12/2024.  She was taken to the operating room  and underwent Coronary Bypass Grafting x 2 utilizing LIMA to LAD and SVG to PDA.  She also underwent endoscopic harvest of greater saphenous vein from her right thigh.  She tolerated the procedure without difficulty and was taken to the SICU in stable condition.  Consults: {consultation:18241}  Significant Diagnostic Studies: {diagnostics:18242} ECHO INTRAOPERATIVE TEE Result  Date: 09/12/2024  *INTRAOPERATIVE TRANSESOPHAGEAL REPORT *  Patient Name:   Anna Noble Date of Exam: 09/12/2024 Medical Rec #:  [id number]    Height:       67.0 in Accession #:    [id number]   Weight:       183.0 lb Date of Birth:  11-01-1955    BSA:          1.95 m Patient Age:    68 years     BP:           141/66 mmHg Patient Gender: F            HR:           98 bpm. Exam Location:  Inpatient Transesophogeal exam was perform intraoperatively during surgical procedure. Patient was closely monitored under general anesthesia during the entirety of examination. Indications:     CORONARY ARTERY BYPASS GRAFTING (CABG) Sonographer:     Koleen Popper RDCS Performing Phys: [id number] CON RAMAN SU Diagnosing Phys: Elsie Needle MD Complications: No known complications during this procedure. POST-OP IMPRESSIONS Overall, there were no significant changes from pre-bypass. PRE-OP FINDINGS  Left Ventricle: The left ventricle has normal systolic function, with an ejection fraction of 55-60%. The cavity size was normal. There is moderately increased left ventricular wall thickness. No evidence of left ventricular regional wall motion abnormalities. There is moderate concentric left ventricular hypertrophy. Right Ventricle: The right ventricle has normal systolic function. The cavity was normal. There is no increase in right ventricular wall thickness. Left Atrium: Left atrial size was not assessed. No left atrial/left atrial appendage thrombus was detected. Right Atrium: Right atrial size was not assessed. Interatrial Septum: No atrial level shunt detected by color flow Doppler. Pericardium: There is no evidence of pericardial effusion. Mitral Valve: The mitral valve is normal in structure. Mitral valve regurgitation is mild by color flow Doppler. Tricuspid Valve: The tricuspid valve was normal in structure. Tricuspid valve regurgitation was not visualized by color flow Doppler. Aortic Valve: The aortic valve is normal in  structure. Aortic valve regurgitation was not visualized by color flow Doppler. There is mild thickening present on the aortic valve non-coronary and right coronary cusps with normal mobility. Pulmonic Valve: The pulmonic valve was normal in structure. Pulmonic valve regurgitation is not visualized by color flow Doppler. +--------------+-------++ LEFT VENTRICLE        +--------------+-------++ PLAX 2D               +--------------+-------++ LVIDd:        4.80 cm +--------------+-------++ LVIDs:        3.00 cm +--------------+-------++ LV PW:        0.50 cm +--------------+-------++ LV SV:        73 ml   +--------------+-------++ LV SV Index:  36.23   +--------------+-------++                       +--------------+-------++  Elsie Needle MD Electronically signed by Elsie Needle MD Signature Date/Time: 09/12/2024/2:35:02 PM    Final    DG Chest Port 1 View Result Date: 09/12/2024 EXAM: 1 VIEW(S) XRAY OF THE CHEST  09/12/2024 02:01:00 PM COMPARISON: Comparison with 09/08/2024. CLINICAL HISTORY: 241115 S/P CABG x 2 241115 FINDINGS: LINES, TUBES AND DEVICES: An endotracheal tube has been placed with the tip measuring 3 cm above the carina. An enteric tube was placed. The tip is off the field of view but below the left hemidiaphragm. Mediastinal drain and left chest drain in place. Right central venous catheter with tip over the low SVC region. LUNGS AND PLEURA: Linear atelectasis no consolidation seen in the left lung base. Small left pleural effusion. No pneumothorax. HEART AND MEDIASTINUM: Interval postoperative changes with sternotomy wires and vascular markers present. Normal heart size. BONES AND SOFT TISSUES: Interval postoperative changes with sternotomy wires and vascular markers present. IMPRESSION: 1. Interval postoperative changes with sternotomy wires and vascular markers present. 2. Endotracheal tube, enteric tube, mediastinal drain, left chest drain, and right  central venous catheter in place. 3. Small left pleural effusion. 4. Linear atelectasis in the left lung base without consolidation. 5. No pneumothorax. Electronically signed by: Elsie Gravely MD 09/12/2024 02:11 PM EST RP Workstation: [id number]   DG Chest 2 View Result Date: 09/11/2024 EXAM: 2 VIEW(S) XRAY OF THE CHEST 09/08/2024 11:33:15 AM COMPARISON: None available. CLINICAL HISTORY: Pre-op chest exam FINDINGS: LUNGS AND PLEURA: No focal pulmonary opacity. No pleural effusion. No pneumothorax. HEART AND MEDIASTINUM: No acute abnormality of the cardiac and mediastinal silhouettes. BONES AND SOFT TISSUES: Thoracic degenerative changes. IMPRESSION: 1. No acute cardiopulmonary process. Electronically signed by: Ryan Chess MD 09/11/2024 10:44 AM EST RP Workstation: [id number]   VAS US  DOPPLER PRE CABG Result Date: 09/08/2024 PREOPERATIVE VASCULAR EVALUATION Patient Name:  LEVEDA KENDRIX  Date of Exam:   09/08/2024 Medical Rec #: [id number]     Accession #:    [id number] Date of Birth: 13-Jul-1956     Patient Gender: F Patient Age:   10 years Exam Location:  [facility] Procedure:      VAS US  DOPPLER PRE CABG Referring Phys: CON SU --------------------------------------------------------------------------------  Indications:      Pre-CABG and Coronary artery disease involving native coronary                   artery of native heart with angina pectoris. Risk Factors:     Hypertension, hyperlipidemia, Diabetes, current smoker,                   coronary artery disease. Comparison Study: No prior exam. Performing Technologist: Edilia Elden Appl  Examination Guidelines: A complete evaluation includes B-mode imaging, spectral Doppler, color Doppler, and power Doppler as needed of all accessible portions of each vessel. Bilateral testing is considered an integral part of a complete examination. Limited examinations for reoccurring indications may be performed as noted.  Right Carotid Findings:  +----------+--------+--------+--------+-------------------------+--------+           PSV cm/sEDV cm/sStenosisDescribe                 Comments +----------+--------+--------+--------+-------------------------+--------+ CCA Prox  86      17                                                +----------+--------+--------+--------+-------------------------+--------+ CCA Mid   81      20              heterogenous and calcific         +----------+--------+--------+--------+-------------------------+--------+ CCA Distal97  17                                                +----------+--------+--------+--------+-------------------------+--------+ ICA Prox  96      17              calcific                          +----------+--------+--------+--------+-------------------------+--------+ ICA Mid   75      18                                       tortuous +----------+--------+--------+--------+-------------------------+--------+ ICA Distal62      17                                                +----------+--------+--------+--------+-------------------------+--------+ ECA       89      12                                                +----------+--------+--------+--------+-------------------------+--------+ +----------+--------+-------+----------------+------------+           PSV cm/sEDV cmsDescribe        Arm Pressure +----------+--------+-------+----------------+------------+ [id number]            Multiphasic, [id number]          +----------+--------+-------+----------------+------------+ +---------+--------+--+--------+--+---------+ VertebralPSV cm/s58EDV cm/s17Antegrade +---------+--------+--+--------+--+---------+ Right thyroid  nodule noted measuring 1.30 x 0.82 x 1.02 cm. Left Carotid Findings: +----------+--------+--------+--------+-------------------------+--------+           PSV cm/sEDV cm/sStenosisDescribe                 Comments  +----------+--------+--------+--------+-------------------------+--------+ CCA Prox  76      15                                                +----------+--------+--------+--------+-------------------------+--------+ CCA Mid   81      19                                                +----------+--------+--------+--------+-------------------------+--------+ CCA Distal85      21                                                +----------+--------+--------+--------+-------------------------+--------+ ICA Prox  73      25              heterogenous and calcific         +----------+--------+--------+--------+-------------------------+--------+ ICA Mid   79      22                                                +----------+--------+--------+--------+-------------------------+--------+  ICA Distal74      24                                                +----------+--------+--------+--------+-------------------------+--------+ ECA       114     20                                                +----------+--------+--------+--------+-------------------------+--------+ +----------+--------+--------+----------------+------------+ SubclavianPSV cm/sEDV cm/sDescribe        Arm Pressure +----------+--------+--------+----------------+------------+           145             Multiphasic, [id number]          +----------+--------+--------+----------------+------------+ +---------+--------+--+--------+--+---------+ VertebralPSV cm/s72EDV cm/s17Antegrade +---------+--------+--+--------+--+---------+  ABI Findings: +------------------+-----+---------+ Rt Pressure (mmHg)IndexWaveform  +------------------+-----+---------+ 165                    triphasic +------------------+-----+---------+ 162               0.98 triphasic +------------------+-----+---------+ 166               1.01 triphasic +------------------+-----+---------+ 87                0.53 Normal     +------------------+-----+---------+ +------------------+-----+---------+-------+ Lt Pressure (mmHg)IndexWaveform Comment +------------------+-----+---------+-------+ 157                    triphasic        +------------------+-----+---------+-------+ 159               0.96 triphasic        +------------------+-----+---------+-------+ 150               0.91 triphasicAudibly +------------------+-----+---------+-------+ 85                0.52 Normal           +------------------+-----+---------+-------+ +-------+---------------+ ABI/TBIToday's ABI/TBI +-------+---------------+ Right  1.01/ 0.53      +-------+---------------+ Left   0.96/ 0.52      +-------+---------------+  Right Doppler Findings: +--------+--------+---------+ Site    PressureDoppler   +--------+--------+---------+ [id number]     triphasic +--------+--------+---------+ Radial          triphasic +--------+--------+---------+ Ulnar           triphasic +--------+--------+---------+  Left Doppler Findings: +--------+--------+---------+ Site    PressureDoppler   +--------+--------+---------+ [id number]     triphasic +--------+--------+---------+ Radial          triphasic +--------+--------+---------+ Ulnar           triphasic +--------+--------+---------+   Summary: Right Carotid: Velocities in the right ICA are consistent with a 1-39% stenosis. Left Carotid: Velocities in the left ICA are consistent with a 1-39% stenosis. Vertebrals:  Bilateral vertebral arteries demonstrate antegrade flow. Subclavians: Normal flow hemodynamics were seen in bilateral subclavian              arteries. Right ABI: Resting right ankle-brachial index is within normal range. The right toe-brachial index is abnormal. Left ABI: Resting left ankle-brachial index is within normal range. The left toe-brachial index is abnormal. Right Upper Extremity: Doppler waveforms remain within normal limits with right radial  compression. Doppler waveforms decrease 50% with right ulnar compression. Left Upper Extremity:  Doppler waveforms decrease 50% with left radial compression. Doppler waveforms decrease <50% with left ulnar compression.  Electronically signed by Gaile New MD on 09/08/2024 at 9:01:35 PM.    Final    ECHOCARDIOGRAM COMPLETE Result Date: 08/25/2024    ECHOCARDIOGRAM REPORT   Patient Name:   SCHARLENE CATALINA Date of Exam: 08/25/2024 Medical Rec #:  [id number]    Height:       67.0 in Accession #:    [id number]   Weight:       183.0 lb Date of Birth:  [birth year]    BSA:          1.947 m Patient Age:    68 years     BP:           147/74 mmHg Patient Gender: F            HR:           79 bpm. Exam Location:  Outpatient Procedure: 2D Echo, 3D Echo, Cardiac Doppler and Color Doppler (Both Spectral            and Color Flow Doppler were utilized during procedure). Indications:    CAD native vessel I25. 10 Dyspnea R06.00  History:        Patient has prior history of Echocardiogram examinations, most                 recent 01/03/2020. CHF, CAD and Previous Myocardial Infarction,                 Signs/Symptoms:Dyspnea; Risk Factors:Diabetes, Dyslipidemia,                 Hypertension and Current Smoker.  Sonographer:    Koleen Popper RDCS Referring Phys: 66 DAVID W HARDING  Sonographer Comments: Image acquisition challenging due to respiratory motion. IMPRESSIONS  1. No obvious left ventricular apical thrombus (Definity  contrast was not used). Left ventricular ejection fraction, by estimation, is 40 to 45%. Left ventricular ejection fraction by 3D volume is 47 %. The left ventricle has mildly decreased function. The left ventricle demonstrates regional wall motion abnormalities (see scoring diagram/findings for description). Left ventricular diastolic parameters are consistent with Grade I diastolic dysfunction (impaired relaxation).  2. Right ventricular systolic function is normal. The right ventricular size is normal.  3.  Left atrial size was mildly dilated.  4. The mitral valve is normal in structure. Mild mitral valve regurgitation. No evidence of mitral stenosis.  5. The aortic valve is tricuspid. There is mild calcification of the aortic valve. There is mild thickening of the aortic valve. Aortic valve regurgitation is not visualized. Aortic valve sclerosis is present, with no evidence of aortic valve stenosis.  6. The inferior vena cava is normal in size with greater than 50% respiratory variability, suggesting right atrial pressure of 3 mmHg. 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 Mitral insufficiency is less severe. Left ventricular filling pressures are lower. FINDINGS  Left Ventricle: No obvious left ventricular apical thrombus (Definity  contrast was not used). Left ventricular ejection fraction, by estimation, is 40 to 45%. Left ventricular ejection fraction by 3D volume is 47 %. The left ventricle has mildly decreased function. The left ventricle demonstrates regional wall motion abnormalities. The left ventricular internal cavity size was normal in size. There is no left ventricular hypertrophy. Left ventricular diastolic parameters are consistent with Grade I diastolic dysfunction (impaired relaxation). Normal  left ventricular filling pressure.  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 Right Ventricle: The right ventricular size is normal. Right vetricular wall thickness was not well visualized. Right ventricular systolic function is normal. Left Atrium: Left atrial size was mildly dilated. Right Atrium: Right atrial size was normal in size. Pericardium: There is no evidence of pericardial effusion. Mitral Valve: The mitral valve is normal in structure. Mild mitral valve regurgitation. No evidence of mitral valve stenosis. Tricuspid Valve: The tricuspid valve is normal in structure. Tricuspid valve  regurgitation is not demonstrated. Aortic Valve: The aortic valve is tricuspid. There is mild calcification of the aortic valve. There is mild thickening of the aortic valve. Aortic valve regurgitation is not visualized. Aortic valve sclerosis is present, with no evidence of aortic valve stenosis. Pulmonic Valve: The pulmonic valve was not well visualized. Pulmonic valve regurgitation is not visualized. No evidence of pulmonic stenosis. Aorta: The aortic root and ascending aorta are structurally normal, with no evidence of dilitation. Venous: The inferior vena cava is normal in size with greater than 50% respiratory variability, suggesting right atrial pressure of 3 mmHg. IAS/Shunts: No atrial level shunt detected by color flow Doppler. Additional Comments: 3D was performed not requiring image post processing on an independent workstation and was abnormal.  LEFT VENTRICLE PLAX 2D LVIDd:         4.90 cm         Diastology LVIDs:         3.50 cm         LV e' medial:    4.21 cm/s LV PW:         1.10 cm         LV E/e' medial:  9.4 LV IVS:        1.60 cm         LV e' lateral:   6.57 cm/s LVOT diam:     2.10 cm         LV E/e' lateral: 6.0 LV SV:         50 LV SV Index:   26 LVOT Area:     3.46 cm        3D Volume EF                                LV 3D EF:    Left                                             ventricul LV Volumes (MOD)                            ar LV vol d, MOD    109.0 ml                   ejection A4C:                                        fraction LV vol s, MOD    59.3 ml                    by 3D A4C:  volume is LV SV MOD A4C:   109.0 ml                   47 %.                                 3D Volume EF:                                3D EF:        47 %                                LV EDV:       92 ml                                LV ESV:       49 ml                                LV SV:        43 ml RIGHT VENTRICLE             IVC RV Basal diam:  3.60 cm      IVC diam: 1.10 cm RV S prime:     10.30 cm/s TAPSE (M-mode): 1.8 cm LEFT ATRIUM             Index        RIGHT ATRIUM          Index LA diam:        3.80 cm 1.95 cm/m   RA Area:     7.79 cm LA Vol (A2C):   30.0 ml 15.41 ml/m  RA Volume:   14.40 ml 7.39 ml/m LA Vol (A4C):   50.7 ml 26.04 ml/m LA Biplane Vol: 41.1 ml 21.11 ml/m  AORTIC VALVE LVOT Vmax:   71.60 cm/s LVOT Vmean:  47.200 cm/s LVOT VTI:    0.144 m  AORTA Ao Root diam: 2.80 cm Ao Asc diam:  2.50 cm MITRAL VALVE MV Area (PHT): 3.21 cm    SHUNTS MV Decel Time: 236 msec    Systemic VTI:  0.14 m MV E velocity: 39.70 cm/s  Systemic Diam: 2.10 cm MV A velocity: 76.40 cm/s MV E/A ratio:  0.52 Mihai Croitoru MD Electronically signed by Jerel Balding MD Signature Date/Time: 08/25/2024/2:58:44 PM    Final    CARDIAC CATHETERIZATION Result Date: 08/14/2024 Images from the original result were not included.   Prox RCA lesion is 100% stenosed. Dist RCA lesion is 100% stenosed. RPAV-1 lesion is 90% stenosed. RPAV-2 lesion is 95% stenosed.   Prox LAD to Mid LAD lesion is 30% stenosed.   Previously placed mid LAD bare-metal stent is 80% s in-stent restenosis tenosed.   LV end diastolic pressure is moderately elevated.  There is no aortic valve stenosis. Dominance: Right Severe two-vessel disease: 100% proximal RCA CTO with faint filling of the PL system from the LCx and RPDA from the septal perforators 30% proximal LAD leading to D1, between D1 and D2 there is the previously placed stent with 80% ISR. The rest the LAD is relatively free of disease after D2 and wraps the apex  filling the entire apex. Small, nondominant LCx with proximal 40% stenosis, and large caliber bifurcating Ramus Intermedius with minimal disease. Moderately elevated LVEDP RECOMMENDATIONS Plan to review images with CVTS to confirm whether the distal right system (RPDA) would be a target for CABG, otherwise we will plan for staged DEB PCI of the LAD ISR on Friday, 08/18/2024 Pending  decision from CVTS, will hold off on loading with antiplatelet agent until the day of procedure. [name], MD    Treatments: surgery: *** Date of Surgery: 09/12/2024    Preop Diagnosis:  Multivessel coronary artery disease Postop Diagnosis: Same Surgeon:  Con GORMAN Clunes, MD Assistant(s):  Rocky Shad PA-C (performed right [facility]) Anesthesia: GET   Procedures: 1. CABG x 2 (SVG - PDA, LIMA - LAD) 2. Endoscopic Vein Harvest   Discharge Exam: Blood pressure 101/66, pulse 99, temperature (!) 100.8 F (38.2 C), temperature source Axillary, resp. rate 20, height 5' 7 (1.702 m), weight 79.5 kg, SpO2 97%. {physical [id number]   Discharge Medications:  The patient has been discharged on:   1.Beta Blocker:  Yes [   ]                              No   [   ]                              If No, reason:  2.Ace Inhibitor/ARB: Yes [   ]                                     No  [    ]                                     If No, reason:  3.Statin:   Yes [   ]                  No  [   ]                  If No, reason:  4.Ecasa:  Yes  [   ]                  No   [   ]                  If No, reason:  Patient had ACS upon admission:  Plavix /P2Y12 inhibitor: Yes [   ]                                      No  [   ]     Discharge Instructions     AMB Referral to Cardiac Rehabilitation - Phase II   Complete by: As directed    Diagnosis: CABG   CABG X ___: 2   After initial evaluation and assessments completed: Virtual Based Care may be provided alone or in conjunction with Phase 2 Cardiac Rehab based on patient barriers.: Yes   Intensive Cardiac Rehabilitation (ICR) [facility] location only OR Traditional Cardiac Rehabilitation (TCR) *If criteria for ICR are not met will enroll in TCR [facility]  only): Yes      Allergies as of 09/13/2024       Reactions   Penicillins Anaphylaxis   Aspirin  Hives   Nsaids Hives   Latex Rash     Med Rec must be completed prior to using this  SMARTLINK***       Follow-up Information     Waddell Palma, PA-C. Schedule an appointment as soon as possible for a visit.   Specialty: Physician Assistant Why: Please make appointment for 1-2 weeks post hospital discharge for follow up Contact information: [address] [place] [address] [phone]         Rutha Manuelita HERO, PA-C Follow up on 09/26/2024.   Specialties: Physician Assistant, Thoracic Surgery Why: Appointment is at 11:30, please get CXR at 10:30 on 2nd floor of our office building Contact information: [address] [place] [address] [phone]                 Signed:  Lemond FORBES Cera, PA-C  09/13/2024, 8:25 AM

## 2024-09-14 ENCOUNTER — Inpatient Hospital Stay (HOSPITAL_COMMUNITY)

## 2024-09-14 DIAGNOSIS — Z951 Presence of aortocoronary bypass graft: Secondary | ICD-10-CM | POA: Diagnosis not present

## 2024-09-14 LAB — BASIC METABOLIC PANEL WITH GFR
Anion gap: 9 (ref 5–15)
BUN: 19 mg/dL (ref 8–23)
CO2: 25 mmol/L (ref 22–32)
Calcium: 8.9 mg/dL (ref 8.9–10.3)
Chloride: 101 mmol/L (ref 98–111)
Creatinine, Ser: 1.05 mg/dL — ABNORMAL HIGH (ref 0.44–1.00)
GFR, Estimated: 57 mL/min — ABNORMAL LOW (ref >60)
Glucose, Bld: 154 mg/dL — ABNORMAL HIGH (ref 70–99)
Potassium: 4.3 mmol/L (ref 3.5–5.1)
Sodium: 135 mmol/L (ref 135–145)

## 2024-09-14 LAB — POCT I-STAT 7, (LYTES, BLD GAS, ICA,H+H)
Acid-Base Excess: 1 mmol/L (ref 0.0–2.0)
Bicarbonate: 25.1 mmol/L (ref 20.0–28.0)
Calcium, Ion: 1.15 mmol/L (ref 1.15–1.40)
HCT: 27 % — ABNORMAL LOW (ref 36.0–46.0)
Hemoglobin: 9.2 g/dL — ABNORMAL LOW (ref 12.0–15.0)
O2 Saturation: 95 %
Patient temperature: 97.9
Potassium: 4.1 mmol/L (ref 3.5–5.1)
Sodium: 136 mmol/L (ref 135–145)
TCO2: 26 mmol/L (ref 22–32)
pCO2 arterial: 38 mmHg (ref 32–48)
pH, Arterial: 7.426 (ref 7.35–7.45)
pO2, Arterial: 71 mmHg — ABNORMAL LOW (ref 83–108)

## 2024-09-14 LAB — GLUCOSE, CAPILLARY
Glucose-Capillary: 153 mg/dL — ABNORMAL HIGH (ref 70–99)
Glucose-Capillary: 168 mg/dL — ABNORMAL HIGH (ref 70–99)
Glucose-Capillary: 129 mg/dL — ABNORMAL HIGH (ref 70–99)
Glucose-Capillary: 157 mg/dL — ABNORMAL HIGH (ref 70–99)
Glucose-Capillary: 160 mg/dL — ABNORMAL HIGH (ref 70–99)

## 2024-09-14 LAB — CBC
HCT: 27.6 % — ABNORMAL LOW (ref 36.0–46.0)
Hemoglobin: 8.6 g/dL — ABNORMAL LOW (ref 12.0–15.0)
MCH: 30 pg (ref 26.0–34.0)
MCHC: 31.2 g/dL (ref 30.0–36.0)
MCV: 96.2 fL (ref 80.0–100.0)
Platelets: 143 K/uL — ABNORMAL LOW (ref 150–400)
RBC: 2.87 MIL/uL — ABNORMAL LOW (ref 3.87–5.11)
RDW: 13.8 % (ref 11.5–15.5)
WBC: 13.7 K/uL — ABNORMAL HIGH (ref 4.0–10.5)
nRBC: 0 % (ref 0.0–0.2)

## 2024-09-14 LAB — TSH: TSH: 5.49 u[IU]/mL — ABNORMAL HIGH (ref 0.350–4.500)

## 2024-09-14 LAB — AMMONIA: Ammonia: 26 umol/L (ref 9–35)

## 2024-09-14 MED ORDER — HEPARIN SODIUM (PORCINE) 5000 UNIT/ML IJ SOLN
5000.0000 [IU] | Freq: Three times a day (TID) | INTRAMUSCULAR | Status: DC
  Administered 2024-09-14 – 2024-09-18 (×12): 5000 [IU] via SUBCUTANEOUS
  Filled 2024-09-14 (×12): qty 1

## 2024-09-14 MED ORDER — INSULIN ASPART 100 UNIT/ML IJ SOLN
0.0000 [IU] | Freq: Three times a day (TID) | INTRAMUSCULAR | Status: DC
  Administered 2024-09-14: 12:00:00 3 [IU] via SUBCUTANEOUS
  Administered 2024-09-14 – 2024-09-15 (×3): 4 [IU] via SUBCUTANEOUS
  Administered 2024-09-16: 3 [IU] via SUBCUTANEOUS
  Administered 2024-09-17: 11 [IU] via SUBCUTANEOUS
  Administered 2024-09-17: 3 [IU] via SUBCUTANEOUS
  Administered 2024-09-18: 7 [IU] via SUBCUTANEOUS
  Filled 2024-09-14: qty 3
  Filled 2024-09-14: qty 4
  Filled 2024-09-14: qty 3
  Filled 2024-09-14: qty 1
  Filled 2024-09-14: qty 4
  Filled 2024-09-14: qty 7
  Filled 2024-09-14: qty 4
  Filled 2024-09-14: qty 1

## 2024-09-14 MED ORDER — SODIUM CHLORIDE 0.9% FLUSH: 3.0000 mL | INTRAVENOUS | Status: DC | PRN

## 2024-09-14 MED ORDER — FUROSEMIDE 10 MG/ML IJ SOLN
40.0000 mg | Freq: Once | INTRAMUSCULAR | Status: AC
  Administered 2024-09-14: 07:00:00 40 mg via INTRAVENOUS
  Filled 2024-09-14: qty 4

## 2024-09-14 MED ORDER — OXYCODONE HCL 5 MG PO TABS
5.0000 mg | ORAL_TABLET | ORAL | Status: DC | PRN
  Administered 2024-09-14 (×2): 5 mg via ORAL
  Filled 2024-09-14 (×3): qty 1

## 2024-09-14 MED ORDER — FUROSEMIDE 10 MG/ML IJ SOLN
20.0000 mg | Freq: Once | INTRAMUSCULAR | Status: AC
  Administered 2024-09-14: 20:00:00 20 mg via INTRAVENOUS
  Filled 2024-09-14: qty 2

## 2024-09-14 MED ORDER — ~~LOC~~ CARDIAC SURGERY, PATIENT & FAMILY EDUCATION: Freq: Once | Status: DC

## 2024-09-14 MED ORDER — GABAPENTIN 300 MG PO CAPS
300.0000 mg | ORAL_CAPSULE | Freq: Every day | ORAL | Status: DC
  Administered 2024-09-14 – 2024-09-17 (×4): 300 mg via ORAL
  Filled 2024-09-14 (×4): qty 1

## 2024-09-14 MED ORDER — SODIUM CHLORIDE 0.9% FLUSH
3.0000 mL | Freq: Two times a day (BID) | INTRAVENOUS | Status: DC
  Administered 2024-09-14: 23:00:00 3 mL via INTRAVENOUS

## 2024-09-14 MED ORDER — INSULIN ASPART 100 UNIT/ML IJ SOLN: 0.0000 [IU] | Freq: Every day | INTRAMUSCULAR | Status: DC

## 2024-09-14 MED ORDER — FUROSEMIDE 10 MG/ML IJ SOLN: 40.0000 mg | Freq: Once | INTRAMUSCULAR | Status: DC

## 2024-09-14 MED ORDER — SODIUM CHLORIDE 0.9 % IV SOLN: 250.0000 mL | INTRAVENOUS | Status: DC | PRN

## 2024-09-14 MED FILL — Sodium Bicarbonate IV Soln 8.4%: INTRAVENOUS | Qty: 100 | Status: AC

## 2024-09-14 MED FILL — Calcium Chloride Inj 10%: INTRAVENOUS | Qty: 10 | Status: AC

## 2024-09-14 MED FILL — Lidocaine HCl Local Preservative Free (PF) Inj 2%: INTRAMUSCULAR | Qty: 14 | Status: AC

## 2024-09-14 MED FILL — Mannitol IV Soln 20%: INTRAVENOUS | Qty: 500 | Status: AC

## 2024-09-14 MED FILL — Heparin Sodium (Porcine) Inj 1000 Unit/ML: Qty: 1000 | Status: AC

## 2024-09-14 MED FILL — Albumin, Human Inj 5%: INTRAVENOUS | Qty: 250 | Status: AC

## 2024-09-14 MED FILL — Heparin Sodium (Porcine) Inj 1000 Unit/ML: INTRAMUSCULAR | Qty: 10 | Status: AC

## 2024-09-14 MED FILL — Potassium Chloride Inj 2 mEq/ML: INTRAVENOUS | Qty: 40 | Status: AC

## 2024-09-14 MED FILL — Electrolyte-R (PH 7.4) Solution: INTRAVENOUS | Qty: 5000 | Status: AC

## 2024-09-14 MED FILL — Sodium Chloride IV Soln 0.9%: INTRAVENOUS | Qty: 1000 | Status: AC

## 2024-09-14 NOTE — Consult Note (Signed)
 Physical Medicine and Rehabilitation Consult Reason for Consult:CIR Referring Physician: Dr Daniel   HPI: Anna FISCHEL is a 68 y.o. female with hx of HTN, HLD, DM A1c 7.9, Hypothyroidism, depression, MI s/p stent LAD; neuropathy and CHF as well as daily THC/marijuana usage;  admitted for planned CABG 09/12/24.  Since still in ICU due to Somnolence- they've stopped flexeril   and ordering ABG, TSH, ammonia- she's hard to wake up and needs heavy cues in therapy sessions.   Since surgery, has required diuresis multiple times with IV Lasix ; She's requiring Foley for fluid monitoring.   Her WBC up to 13.7 from 11.4 2 days ago- Had temp of 99.8 at 1130am. Afebrile prior. Likely atelectasis.    She's mod to max A for ADLs, with mod A sit to stand and walked 22 ft with EVA walker- min A of 2-  but requiring heavy cues and has poor sitting and standing balance.    Pt reports LBM before admission either Saturday or Sunday Pain 2/10 currently- was5/10 when meds wear off  Review of Systems  Unable to perform ROS: Acuity of condition  Constitutional:  Positive for malaise/fatigue.  HENT:  Positive for hearing loss.   Eyes: Negative.   Respiratory:  Negative for cough and shortness of breath.   Cardiovascular:  Positive for leg swelling. Negative for chest pain.  Gastrointestinal:  Positive for constipation.       LBM Sat or Sunday but not eating well either  Genitourinary:        Has foley  Musculoskeletal:  Positive for myalgias.  Skin: Negative.   Neurological:  Positive for weakness. Negative for sensory change and speech change.  Psychiatric/Behavioral:         Sedation- keeps falling asleep  All other systems reviewed and are negative.  Past Medical History:  Diagnosis Date   Abnormal Pap smear of cervix    ascus hpv hr+   Acute myocardial infarction of other anterior wall, episode of care unspecified    Allergy    Arthritis    CHF (congestive heart failure) (HCC)     Coronary atherosclerosis of native coronary artery    Depression    DM (diabetes mellitus) (HCC)    Dysmetabolic syndrome X    HLD (hyperlipidemia)    HTN (hypertension)    Hypothyroidism    Neuromuscular disorder (HCC)    Obesity, unspecified    Tobacco use disorder    Past Surgical History:  Procedure Laterality Date   ACHILLES TENDON SURGERY Left    CARDIAC CATHETERIZATION     Cardiac stenting     CORONARY ARTERY BYPASS GRAFT N/A 09/12/2024   Procedure: CORONARY ARTERY BYPASS GRAFTING TIMES TWO, USING LEFT INTERNAL MAMMARY ARTERY AND ENDOSCOPICALLY HARVESTED RIGHT GREATER SAPHENOUS VEIN;  Surgeon: Daniel Con RAMAN, MD;  Location: [facility] OR;  Service: Open Heart Surgery;  Laterality: N/A;   EYE SURGERY     HAND SURGERY Right    ganglion cyst, bone graft   INTRAOPERATIVE TRANSESOPHAGEAL ECHOCARDIOGRAM N/A 09/12/2024   Procedure: ECHOCARDIOGRAM, TRANSESOPHAGEAL, INTRAOPERATIVE;  Surgeon: Daniel Con RAMAN, MD;  Location: [facility] OR;  Service: Open Heart Surgery;  Laterality: N/A;   KNEE ARTHROSCOPY Right 1976   LEFT HEART CATH AND CORONARY ANGIOGRAPHY N/A 08/14/2024   Procedure: LEFT HEART CATH AND CORONARY ANGIOGRAPHY;  Surgeon: Anner Alm ORN, MD;  Location: [facility] LAB;  Service: Cardiovascular;  Laterality: N/A;   STRABISMUS SURGERY Left 1959   TOE SURGERY Right  x 3   TUBAL LIGATION     Family History  Problem Relation Age of Onset   Dementia Mother    Hypertension Mother    Diabetes Mother    Depression Father    Heart disease Father 20       AMI age 71; CABG.   Prostate cancer Father    Hypertension Father    Cancer Father        prostate cancer   Hypertension Brother    Arthritis Brother    Heart disease Brother 77       AMI   Diabetes Sister    Heart disease Sister 29       mild AMI   Hypertension Sister    Multiple sclerosis Sister    Down syndrome Son    Colon cancer Maternal Aunt 43   Social History:  reports that she has been smoking e-cigarettes and  cigarettes. She has a 15 pack-year smoking history. She uses smokeless tobacco. She reports that she does not currently use alcohol. She reports current drug use. Drug: Marijuana. Allergies: Allergies[1] Medications Prior to Admission  Medication Sig Dispense Refill   ascorbic acid (VITAMIN C) 500 MG tablet Take 500 mg by mouth daily.     bisoprolol-hydrochlorothiazide  (ZIAC) 10-6.25 MG tablet Take 1 tablet by mouth daily.     CINNAMON PO Take 1,000 mg by mouth daily.     cyanocobalamin (VITAMIN B12) 1000 MCG tablet Take 1,000 mcg by mouth in the morning and at bedtime.     cyclobenzaprine  (FLEXERIL ) 5 MG tablet Take 5 mg by mouth at bedtime.     dapagliflozin propanediol (FARXIGA) 10 MG TABS tablet Take 10 mg by mouth daily.     doxylamine, Sleep, (UNISOM) 25 MG tablet Take 25 mg by mouth at bedtime.     DULoxetine  (CYMBALTA ) 60 MG capsule Take 60 mg by mouth daily.     ergocalciferol  (VITAMIN D2) 1.25 MG (50000 UT) capsule Take 50,000 Units by mouth once a week.     gabapentin  (NEURONTIN ) 600 MG tablet Take 600 mg by mouth at bedtime.     ipratropium (ATROVENT ) 0.03 % nasal spray Place 2 sprays into both nostrils 2 (two) times daily as needed for rhinitis.     lansoprazole (PREVACID) 30 MG capsule Take 30 mg by mouth daily.     levocetirizine (XYZAL ) 5 MG tablet Take 5 mg by mouth daily.     levothyroxine  (SYNTHROID ) 25 MCG tablet Take 25 mcg by mouth daily before breakfast.     MAGNESIUM  OXIDE PO Take 500 mg by mouth daily.     nitroGLYCERIN  (NITROSTAT ) 0.4 MG SL tablet Place 1 tablet (0.4 mg total) under the tongue every 5 (five) minutes as needed. 10 tablet 0   OZEMPIC, 2 MG/DOSE, 8 MG/3ML SOPN Inject 2 mg into the skin once a week.     potassium chloride  SA (KLOR-CON  M) 20 MEQ tablet Take 20 mEq by mouth daily.     rosuvastatin  (CRESTOR ) 20 MG tablet Take 20 mg by mouth daily.     sacubitril-valsartan (ENTRESTO) 97-103 MG Take 1 tablet by mouth 2 (two) times daily.     TURMERIC PO Take  1,000 mg by mouth daily. Complex     ACCU-CHEK AVIVA PLUS test strip SMARTSIG:1 Strip(s) Via Meter Every Morning     Accu-Chek Softclix Lancets lancets SMARTSIG:Topical     albuterol  (PROVENTIL  HFA;VENTOLIN  HFA) 108 (90 Base) MCG/ACT inhaler Inhale 2 puffs into the lungs every 4 (four)  hours as needed for wheezing or shortness of breath (cough, shortness of breath or wheezing.). 1 Inhaler 0   Blood Glucose Monitoring Suppl (ACCU-CHEK AVIVA PLUS) w/Device KIT Use 1 kit as directed once a day  to check fasting blood sugar for diabetes     clopidogrel  (PLAVIX ) 75 MG tablet Take 1 tablet (75 mg total) by mouth daily. Per Dr Sherrlyn take Plavix  until 08/27/24 then discontinue     furosemide  (LASIX ) 40 MG tablet Take 40 mg by mouth daily as needed for edema or fluid.     levothyroxine  (SYNTHROID , LEVOTHROID) 75 MCG tablet Take 1 tablet (75 mcg total) by mouth daily. (Patient not taking: Reported on 09/05/2024) 90 tablet 3    Home: Home Living Family/patient expects to be discharged to:: Private residence Living Arrangements: Spouse/significant other, Children Available Help at Discharge: Family, Available 24 hours/day Type of Home: House Home Access: Stairs to enter [employer] of Steps: 9 Entrance Stairs-Rails: Right, Left Home Layout: Two level, Able to live on main level with bedroom/bathroom, Bed/bath upstairs, Full bath on main level Alternate Level Stairs-Number of Steps: flight Bathroom Shower/Tub: Tub/shower unit, Health Visitor: Standard Home Equipment: Agricultural Consultant (2 wheels), [employer] - single point, Information systems manager, Grab bars - tub/shower, Wheelchair - manual  Functional History: Prior Function Prior Level of Function : Independent/Modified Independent, Driving Mobility Comments: No AD Functional Status:  Mobility: Bed Mobility Overal bed mobility: Needs Assistance Bed Mobility: Sit to Supine Supine to sit: Mod assist, +2 for physical assistance, +2 for  safety/equipment, HOB elevated Sit to supine: Mod assist General bed mobility comments: Assist for managing BLEs. Heavy cueing for sequencing Transfers Overall transfer level: Needs assistance Equipment used: Rolling walker (2 wheels) Transfers: Sit to/from Stand Sit to Stand: Mod assist General transfer comment: mod assist to boost from recliner. Required x3 trials d/t pt with tendency to try to pull with RW Ambulation/Gait Ambulation/Gait assistance: Min assist, +2 physical assistance, +2 safety/equipment Gait Distance (Feet): 22 Feet Assistive device: Elyn Finder Gait Pattern/deviations: Step-through pattern, Decreased step length - right, Decreased step length - left, Decreased stride length, Trunk flexed General Gait Details: Pt needed repeated cues to stand upright rather than flex at trunk and elevate shoulders. She takes slow, small, unsteady steps while using the [place] walker, needing minA to balance and additional +2 minA to guide and drive the Elyn walker Gait velocity: reduced Gait velocity interpretation: <1.31 ft/sec, indicative of household ambulator    ADL: ADL Overall ADL's : Needs assistance/impaired Eating/Feeding: Set up, Sitting Grooming: Set up, Sitting Upper Body Bathing: Moderate assistance, Sitting Lower Body Bathing: Maximal assistance, Sit to/from stand Upper Body Dressing : Moderate assistance, Sitting Lower Body Dressing: Maximal assistance, Sit to/from stand Toilet Transfer: Moderate assistance, Ambulation, Rolling walker (2 wheels) Toileting- Clothing Manipulation and Hygiene: Moderate assistance Functional mobility during ADLs: Moderate assistance, Rolling walker (2 wheels) General ADL Comments: Limited d/t pain and decreased strength  Cognition: Cognition Orientation Level: Oriented X4 Cognition Arousal: Lethargic Behavior During Therapy: WFL for tasks assessed/performed  Blood pressure 124/64, pulse 97, temperature 99.8 F (37.7 C), temperature  source Oral, resp. rate (!) 8, height 5' 7 (1.702 m), weight 87.5 kg, SpO2 95%. Physical Exam Vitals and nursing note reviewed.  Constitutional:      Appearance: She is obese.     Comments: Pt very sleepy- sitting up in chair at bedside; legs elevated, holding phone, but asleep, No acute distress  HENT:     Head: Normocephalic and atraumatic.  Comments: Split -small on lip Tongue midline No facial droop Facial sensation intact b/l    Right Ear: External ear normal.     Left Ear: External ear normal.     Nose: Nose normal. No congestion.     Mouth/Throat:     Mouth: Mucous membranes are moist.     Pharynx: No oropharyngeal exudate.  Eyes:     General:        Right eye: No discharge.        Left eye: No discharge.     Extraocular Movements: Extraocular movements intact.  Cardiovascular:     Rate and Rhythm: Regular rhythm. Tachycardia present.     Heart sounds: Normal heart sounds. No murmur heard.    No gallop.     Comments: Rate in low 100's Pulmonary:     Effort: Pulmonary effort is normal. No respiratory distress.     Breath sounds: No wheezing, rhonchi or rales.     Comments: Chest tube Quiet breath sounds throughout 2L O2 by   Abdominal:     Palpations: Abdomen is soft.     Comments: Soft, NT, but hypoactive BS; not distended- just protuberant  Genitourinary:    Comments: Foley- light amber urine Musculoskeletal:     Cervical back: Neck supple.     Comments: Ue's 5/5 throughout  B/L LE's HF 2+/5 B/L; otherwise 5-/5 B/L LE's  Skin:    General: Skin is warm and dry.     Comments: Sternal incision- with vacuum dressing Chest tube L forearm/wrist IV- looks OK  A lot of bruising of Ue's and some in LE's B/L  Neurological:     Comments: Very sleepy initially hard to wake up- almost fell asleep x1 during interview Knows in hospital; why here- [facility] December 2025 Tongue midline and no facial droop EOM intact B/L- no nystagmus Intact to light  touch B/L in all 4 extremities   Psychiatric:     Comments: Flat- quiet     Results for orders placed or performed during the hospital encounter of 09/12/24 (from the past 24 hours)  Glucose, capillary     Status: Abnormal   Collection Time: 09/13/24  2:44 PM  Result Value Ref Range   Glucose-Capillary 150 (H) 70 - 99 mg/dL  Basic metabolic panel     Status: Abnormal   Collection Time: 09/13/24  6:08 PM  Result Value Ref Range   Sodium 138 135 - 145 mmol/L   Potassium 4.2 3.5 - 5.1 mmol/L   Chloride 104 98 - 111 mmol/L   CO2 26 22 - 32 mmol/L   Glucose, Bld 118 (H) 70 - 99 mg/dL   BUN 17 8 - 23 mg/dL   Creatinine, Ser 8.89 (H) 0.44 - 1.00 mg/dL   Calcium  8.7 (L) 8.9 - 10.3 mg/dL   GFR, Estimated 54 (L) >60 mL/min   Anion gap 9 5 - 15  Magnesium      Status: Abnormal   Collection Time: 09/13/24  6:08 PM  Result Value Ref Range   Magnesium  2.5 (H) 1.7 - 2.4 mg/dL  CBC     Status: Abnormal   Collection Time: 09/13/24  6:08 PM  Result Value Ref Range   WBC 13.2 (H) 4.0 - 10.5 K/uL   RBC 2.95 (L) 3.87 - 5.11 MIL/uL   Hemoglobin 9.0 (L) 12.0 - 15.0 g/dL   HCT 71.6 (L) 63.9 - 53.9 %   MCV 95.9 80.0 - 100.0 fL   MCH 30.5 26.0 -  34.0 pg   MCHC 31.8 30.0 - 36.0 g/dL   RDW 85.9 88.4 - 84.4 %   Platelets 151 150 - 400 K/uL   nRBC 0.0 0.0 - 0.2 %  Glucose, capillary     Status: Abnormal   Collection Time: 09/13/24  7:31 PM  Result Value Ref Range   Glucose-Capillary 155 (H) 70 - 99 mg/dL  Glucose, capillary     Status: Abnormal   Collection Time: 09/13/24 11:19 PM  Result Value Ref Range   Glucose-Capillary 155 (H) 70 - 99 mg/dL  Basic metabolic panel     Status: Abnormal   Collection Time: 09/14/24  2:20 AM  Result Value Ref Range   Sodium 135 135 - 145 mmol/L   Potassium 4.3 3.5 - 5.1 mmol/L   Chloride 101 98 - 111 mmol/L   CO2 25 22 - 32 mmol/L   Glucose, Bld 154 (H) 70 - 99 mg/dL   BUN 19 8 - 23 mg/dL   Creatinine, Ser 8.94 (H) 0.44 - 1.00 mg/dL   Calcium  8.9 8.9 -  10.3 mg/dL   GFR, Estimated 57 (L) >60 mL/min   Anion gap 9 5 - 15  CBC     Status: Abnormal   Collection Time: 09/14/24  2:20 AM  Result Value Ref Range   WBC 13.7 (H) 4.0 - 10.5 K/uL   RBC 2.87 (L) 3.87 - 5.11 MIL/uL   Hemoglobin 8.6 (L) 12.0 - 15.0 g/dL   HCT 72.3 (L) 63.9 - 53.9 %   MCV 96.2 80.0 - 100.0 fL   MCH 30.0 26.0 - 34.0 pg   MCHC 31.2 30.0 - 36.0 g/dL   RDW 86.1 88.4 - 84.4 %   Platelets 143 (L) 150 - 400 K/uL   nRBC 0.0 0.0 - 0.2 %  TSH     Status: Abnormal   Collection Time: 09/14/24  2:20 AM  Result Value Ref Range   TSH 5.490 (H) 0.350 - 4.500 uIU/mL  Glucose, capillary     Status: Abnormal   Collection Time: 09/14/24  3:34 AM  Result Value Ref Range   Glucose-Capillary 153 (H) 70 - 99 mg/dL  Glucose, capillary     Status: Abnormal   Collection Time: 09/14/24  5:59 AM  Result Value Ref Range   Glucose-Capillary 168 (H) 70 - 99 mg/dL  I-STAT 7, (LYTES, BLD GAS, ICA, H+H)     Status: Abnormal   Collection Time: 09/14/24 10:52 AM  Result Value Ref Range   pH, Arterial 7.426 7.35 - 7.45   pCO2 arterial 38.0 32 - 48 mmHg   pO2, Arterial 71 (L) 83 - 108 mmHg   Bicarbonate 25.1 20.0 - 28.0 mmol/L   TCO2 26 22 - 32 mmol/L   O2 Saturation 95 %   Acid-Base Excess 1.0 0.0 - 2.0 mmol/L   Sodium 136 135 - 145 mmol/L   Potassium 4.1 3.5 - 5.1 mmol/L   Calcium , Ion 1.15 1.15 - 1.40 mmol/L   HCT 27.0 (L) 36.0 - 46.0 %   Hemoglobin 9.2 (L) 12.0 - 15.0 g/dL   Patient temperature 02.0 F    Sample type ARTERIAL   Glucose, capillary     Status: Abnormal   Collection Time: 09/14/24 11:28 AM  Result Value Ref Range   Glucose-Capillary 129 (H) 70 - 99 mg/dL   DG Chest Port 1 View Result Date: 09/14/2024 EXAM: 1 VIEW XRAY OF THE CHEST 09/14/2024 05:51:00 AM COMPARISON: 09/13/2024 CLINICAL HISTORY: Status post cardiac surgery FINDINGS:  Right IJ central line is unchanged. Mediastinal drain and 2 left chest tubes are similar. Median sternotomy for CABG. Cardiomegaly,  accentuated by AP portable technique. Probable tiny bilateral pleural effusions. Mild interstitial edema is similar. Slightly decreased inspiratory effort with increased bibasilar atelectasis. IMPRESSION: 1. Increase in bibasilar atelectasis. No pneumothorax or other superimposed acute process. 2. Similar congestive heart failure with tiny bilateral pleural effusions. Electronically signed by: Rockey Kilts MD 09/14/2024 12:05 PM EST RP Workstation: [id number]   DG Chest Port 1 View Result Date: 09/13/2024 CLINICAL DATA:  Status post CABG. EXAM: PORTABLE CHEST 1 VIEW COMPARISON:  09/12/2024 FINDINGS: Endotracheal tube has been removed. Nasogastric tube has been removed. Chest tubes are stable in position. Negative for a pneumothorax. Heart size is stable with post surgical changes. Right jugular central line with the tip in the SVC region. Hazy densities at the left lung base are suggestive for atelectasis and minimally changed. Linear densities at the right lung base are also suggestive for atelectasis. IMPRESSION: 1. Removal of endotracheal tube and nasogastric tube. 2. Bibasilar chest densities are suggestive for atelectasis. 3. Negative for pneumothorax. Electronically Signed   By: Juliene Balder M.D.   On: 09/13/2024 12:38     Assessment/Plan: Diagnosis: CABG x2-  Does the need for close, 24 hr/day medical supervision in concert with the patient's rehab needs make it unreasonable for this patient to be served in a less intensive setting? Yes Co-Morbidities requiring supervision/potential complications: CHF, DM A1c 7.9; HTN, HLD, hypothyroidism, MI s/p LAD stent; depression, neuropathy-former smoker, but daily marijuana usage.  Due to bladder management, bowel management, safety, skin/wound care, disease management, medication administration, pain management, and patient education, does the patient require 24 hr/day rehab nursing? Yes Does the patient require coordinated care of a physician, rehab nurse,  therapy disciplines of PT and OT to address physical and functional deficits in the context of the above medical diagnosis(es)? Yes Addressing deficits in the following areas: balance, endurance, locomotion, strength, transferring, bowel/bladder control, bathing, dressing, feeding, grooming, and toileting Can the patient actively participate in an intensive therapy program of at least 3 hrs of therapy per day at least 5 days per week? Yes The potential for patient to make measurable gains while on inpatient rehab is good Anticipated functional outcomes upon discharge from inpatient rehab are modified independent and supervision  with PT, modified independent and supervision with OT, n/a with SLP. Estimated rehab length of stay to reach the above functional goals is: 10-14 days- based on current therapy levels Anticipated discharge destination: Home Overall Rehab/Functional Prognosis: good  RECOMMENDATIONS: This patient's condition is appropriate for continued rehabilitative care in the following setting: CIR Patient has agreed to participate in recommended program. Patient refuses to come to CIR Note that insurance prior authorization may be required for reimbursement for recommended care.  Comment:  Pt's LBM 4-5 days ago- in spite of eating small amounts, suggest miralax daily at least until has good BM.  Medically per Cards/Critical care- of note, WBC is up somewhat to 13.7 Patient c/o LE weakness- HF 4+/5- suggest more PT to work on this- pt too sleepy to get info on home situation, but refuses to come to CIR currently- might change if needs us  down the line, however right now- progressing well -walked 55 ft with EVA walker this afternoon, up from 20 ft this AM.  Thank you for letting us  participate in the care of this patient.  5. D/W admissions coordinator- holding admission currently     Lynze Reddy  Azelea Seguin, MD 09/14/2024      [1]  Allergies Allergen Reactions   Penicillins Anaphylaxis    Aspirin  Hives   Nsaids Hives   Latex Rash

## 2024-09-14 NOTE — Plan of Care (Signed)
  Problem: Clinical Measurements: Goal: Will remain free from infection Outcome: Progressing Goal: Respiratory complications will improve Outcome: Progressing Goal: Cardiovascular complication will be avoided Outcome: Progressing   Problem: Nutrition: Goal: Adequate nutrition will be maintained Outcome: Progressing   Problem: Coping: Goal: Level of anxiety will decrease Outcome: Progressing   Problem: Pain Managment: Goal: General experience of comfort will improve and/or be controlled Outcome: Progressing

## 2024-09-14 NOTE — Evaluation (Signed)
 Occupational Therapy Evaluation Patient Details Name: ALEXISMARIE FLAIM MRN: [id number] DOB: 1956/08/14 Today's Date: 09/14/2024   History of Present Illness   Pt is a 68 y.o female admitted 12/16 for CABG x2. PMH: HTN, HLD, hypothyroidism, DM, MI, stent LAD, CAD, HFmrEF, dysmetabolic syndrome X, neuromuscular disorder     Clinical Impressions Pt admitted based on above, and was seen based on problem list below. PTA pt was independent with ADLs and IADLs. Today pt is requiring set up  to max assist for ADLs. Bed mobility and functional transfers are  mod assist with RW. Noted pt with decreased cog. Pt able to verbally recall precautions, poor implementation functionally. Arousal levels could impacting. Pt's CLOF is significantly below baseline, and believe pt could achieve mod I with intensive therapy. Recommending >3 hours of skilled rehab daily. OT will continue to follow acutely to maximize functional independence.     If plan is discharge home, recommend the following:   A lot of help with walking and/or transfers;A lot of help with bathing/dressing/bathroom;Assistance with cooking/housework     Functional Status Assessment   Patient has had a recent decline in their functional status and demonstrates the ability to make significant improvements in function in a reasonable and predictable amount of time.     Equipment Recommendations   Other (comment) (Defer to next venue)     Recommendations for Other Services   Rehab consult     Precautions/Restrictions   Precautions Precautions: Fall;Sternal Precaution Booklet Issued: No Recall of Precautions/Restrictions: Impaired Precaution/Restrictions Comments: wound vac chest; Y chest tube; watch SpO2; reviewed precautions Restrictions Other Position/Activity Restrictions: sternal precautions     Mobility Bed Mobility Overal bed mobility: Needs Assistance Bed Mobility: Sit to Supine       Sit to supine: Mod assist    General bed mobility comments: Assist for managing BLEs. Heavy cueing for sequencing    Transfers Overall transfer level: Needs assistance Equipment used: Rolling walker (2 wheels) Transfers: Sit to/from Stand Sit to Stand: Mod assist           General transfer comment: mod assist to boost from recliner. Required x3 trials d/t pt with tendency to try to pull with RW      Balance Overall balance assessment: Needs assistance Sitting-balance support: No upper extremity supported, Feet supported Sitting balance-Leahy Scale: Fair     Standing balance support: Bilateral upper extremity supported, Reliant on assistive device for balance, During functional activity Standing balance-Leahy Scale: Poor Standing balance comment: reliant on RW       ADL either performed or assessed with clinical judgement   ADL Overall ADL's : Needs assistance/impaired Eating/Feeding: Set up;Sitting   Grooming: Set up;Sitting   Upper Body Bathing: Moderate assistance;Sitting   Lower Body Bathing: Maximal assistance;Sit to/from stand   Upper Body Dressing : Moderate assistance;Sitting   Lower Body Dressing: Maximal assistance;Sit to/from stand   Toilet Transfer: Moderate assistance;Ambulation;Rolling walker (2 wheels)   Toileting- Clothing Manipulation and Hygiene: Moderate assistance       Functional mobility during ADLs: Moderate assistance;Rolling walker (2 wheels) General ADL Comments: Limited d/t pain and decreased strength     Vision Baseline Vision/History: 1 Wears glasses Vision Assessment?: No apparent visual deficits            Pertinent Vitals/Pain Pain Assessment Pain Assessment: Faces Faces Pain Scale: Hurts even more Pain Location: sternum Pain Descriptors / Indicators: Discomfort, Grimacing, Guarding, Operative site guarding, Sore Pain Intervention(s): Limited activity within patient's tolerance  Extremity/Trunk Assessment Upper Extremity Assessment Upper  Extremity Assessment: Generalized weakness   Lower Extremity Assessment Lower Extremity Assessment: Defer to PT evaluation       Communication Communication Communication: No apparent difficulties   Cognition Arousal: Lethargic Behavior During Therapy: WFL for tasks assessed/performed Cognition: Cognition impaired   Orientation impairments: Time Awareness: Online awareness impaired   Attention impairment (select first level of impairment): Focused attention Executive functioning impairment (select all impairments): Reasoning, Problem solving OT - Cognition Comments: Pt lethargic, but responds with cues. Able to verbally recall sternal precautions, poor implementation functionally         Following commands: Impaired Following commands impaired: Follows multi-step commands with increased time     Cueing  General Comments   Cueing Techniques: Verbal cues;Tactile cues  Pt sats on 2L at 95%. Decreased to RA, pt desat to 87%. Replaced on 2L, left resting in bed at 92%           Home Living Family/patient expects to be discharged to:: Private residence Living Arrangements: Spouse/significant other;Children Available Help at Discharge: Family;Available 24 hours/day Type of Home: House Home Access: Stairs to enter [employer] of Steps: 9 Entrance Stairs-Rails: Right;Left Home Layout: Two level;Able to live on main level with bedroom/bathroom;Bed/bath upstairs;Full bath on main level Alternate Level Stairs-Number of Steps: flight   Bathroom Shower/Tub: Tub/shower unit;Walk-in shower   Bathroom Toilet: Standard     Home Equipment: Agricultural [employer]ant (2 wheels);Cane - single point;Shower seat;Grab bars - tub/shower;Wheelchair - manual          Prior Functioning/Environment Prior Level of Function : Independent/Modified Independent;Driving             Mobility Comments: No AD      OT Problem List: Decreased strength;Decreased activity  tolerance;Decreased range of motion;Impaired balance (sitting and/or standing);Decreased cognition;Decreased safety awareness;Decreased knowledge of use of DME or AE;Decreased knowledge of precautions;Cardiopulmonary status limiting activity;Pain   OT Treatment/Interventions: Self-care/ADL training;Therapeutic exercise;Energy conservation;DME and/or AE instruction;Therapeutic activities;Patient/family education      OT Goals(Current goals can be found in the care plan section)   Acute Rehab OT Goals Patient Stated Goal: To be in less pain OT Goal Formulation: With patient Time For Goal Achievement: 09/28/24 Potential to Achieve Goals: Fair   OT Frequency:  Min 2X/week       AM-PAC OT 6 Clicks Daily Activity     Outcome Measure Help from another person eating meals?: None Help from another person taking care of personal grooming?: A Little Help from another person toileting, which includes using toliet, bedpan, or urinal?: Total Help from another person bathing (including washing, rinsing, drying)?: A Lot Help from another person to put on and taking off regular upper body clothing?: A Lot Help from another person to put on and taking off regular lower body clothing?: A Lot 6 Click Score: 14   End of Session Equipment Utilized During Treatment: Rolling walker (2 wheels);Oxygen (2L) Nurse Communication: Mobility status  Activity Tolerance: Patient limited by pain Patient left: in bed;with call bell/phone within reach  OT Visit Diagnosis: Unsteadiness on feet (R26.81);Other abnormalities of gait and mobility (R26.89);Muscle weakness (generalized) (M62.81)                Time: 9252-9183 OT Time Calculation (min): 29 min Charges:  OT General Charges $OT Visit: 1 Visit OT Evaluation $OT Eval Moderate Complexity: 1 Mod OT Treatments $Self Care/Home Management : 8-22 mins  Adrianne BROCKS, OT  Acute Rehabilitation Services Office [phone] Secure chat preferred  Adrianne GORMAN Savers 09/14/2024, 8:47 AM

## 2024-09-14 NOTE — Progress Notes (Signed)
 IP rehab admissions - I met briefly with patient.  She says that she wants to DC directly home and not come to rehab in the hospital.  If this changes, please let me know.  [phone]

## 2024-09-14 NOTE — Progress Notes (Signed)
 Pt transferred to 4E-08 per order with no complications. Drains intact, VSS on 2L O2 via [place] throughout transport. Report given to [facility]. All belongings sent with patient.

## 2024-09-14 NOTE — Progress Notes (Signed)
° °  NAME:  Anna Noble, MRN:  [id number], DOB:  1956/05/30, LOS: 2 ADMISSION DATE:  09/12/2024, CONSULTATION DATE:  12/16 REFERRING MD:  Lou Irigoyen MA CHIEF COMPLAINT:  CABG   History of Present Illness:  68 year old female with past medical history of hypertension, hyperlipidemia, hypothyroidism, diabetes, MI 2001 s/p stent LAD, CAD, HFmrEF who presents for CABG.   Has been experiencing progressive DOE and fatigue. She was being followed by Dr. Anner who scheduled cardiac catheterization 07/2024 which demonstrated prox RCA 100% stenosis, previous LAD stent 80% stenosis. Echo later 08/25/24 showing LVEF 40-45%, G1DD, RV normal. Referred to [facility] for CABG.   Pump time: 1h 64m Xclamp time: 1h UOP: 2000 Cell saver: 228 Blood products: none  Pertinent  Medical History  hypertension, hyperlipidemia, hypothyroidism, diabetes, MI 2001 s/p stent LAD, CAD, HFmrEF  Significant Hospital Events: Including procedures, antibiotic start and stop dates in addition to other pertinent events   12/16: admit ICU s/p CABG x 2 RSVG PDA, LIMA LAD   12/17: cap wires, aline out, transition off insulin  gtt    Interim History / Subjective:  No events, remains weak and somnolent intermittently.  Objective   Blood pressure 124/77, pulse (!) 105, temperature 97.9 F (36.6 C), temperature source Oral, resp. rate 17, height 5' 7 (1.702 m), weight 87.5 kg, SpO2 94%.        Intake/Output Summary (Last 24 hours) at 09/14/2024 0941 Last data filed at 09/14/2024 0800 Gross per 24 hour  Intake 27 ml  Output 750 ml  Net -723 ml   Filed Weights   09/12/24 9367 09/13/24 0559 09/14/24 0500  Weight: 83 kg 79.5 kg 87.5 kg    Examination: Sleepy Globally weak Incision looks good Nonlabored breathing Moves to command Oriented  CBC/BMP look okay CXR some volume loss at bases atelectasis vs effusion  Resolved Hospital Problem list   Post op vent management  Post bypass vasoplegia  AKI Assessment & Plan:   Multivessel CAD s/p CABG x2 RSVG PDA, LIMA LAD History of MI s/p stent 2001  HFmrEF History of tobacco use, 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

## 2024-09-14 NOTE — Progress Notes (Signed)
 Patient arrived to the unit from [facility], alert & oriented x4, c/o 5/10 back pain, CCMD notied, CHG bath given.

## 2024-09-14 NOTE — Progress Notes (Signed)
 Inpatient Rehab Admissions Coordinator Note:   Per therapy recommendations patient was screened for CIR candidacy by Reche FORBES Lowers, PT. At this time, pt appears to be a potential candidate for CIR. I will place an order for rehab consult for full assessment, per our protocol.  Please contact me any with questions.SABRA Reche Lowers, PT, DPT [phone] 09/14/2024 8:28 AM

## 2024-09-14 NOTE — Progress Notes (Signed)
 2 Days Post-Op Procedures (LRB): CORONARY ARTERY BYPASS GRAFTING TIMES TWO, USING LEFT INTERNAL MAMMARY ARTERY AND ENDOSCOPICALLY HARVESTED RIGHT GREATER SAPHENOUS VEIN (N/A) ECHOCARDIOGRAM, TRANSESOPHAGEAL, INTRAOPERATIVE (N/A) Subjective: Doing well hemodynamically, remains drowsy this morning but easily arousable No focal neuro deficits  Objective: Vital signs in last 24 hours: BP 116/61   Pulse 100   Temp 97.8 F (36.6 C) (Oral)   Resp 18   Ht 5' 7 (1.702 m)   Wt 87.5 kg   SpO2 95%   BMI 30.20 kg/m  Filed Weights   09/12/24 0632 09/13/24 0559 09/14/24 0500  Weight: 83 kg 79.5 kg 87.5 kg    Hemodynamic parameters for last 24 hours: CVP:  [10 mmHg-17 mmHg] 17 mmHg CO:  [6.7 L/min-7.1 L/min] 6.7 L/min CI:  [3.5 L/min/m2-3.6 L/min/m2] 3.5 L/min/m2  Intake/Output from previous day: 12/17 0701 - 12/18 0700 In: 150.5 [I.V.:150.5] Out: 820 [Urine:490; Chest Tube:330] Intake/Output this shift: No intake/output data recorded.  Physical Exam: General - Resting comfortably in chair; drowsy but easily arousable CV - RRR Resp - Unlabored on [place], chest tube output thin Abd - Soft, ND/NT Ext - Mild edema  Lab Results:    Latest Ref Rng & Units 09/14/2024    2:20 AM 09/13/2024    6:08 PM 09/13/2024    4:10 AM  CBC  WBC 4.0 - 10.5 K/uL 13.7  13.2  11.4   Hemoglobin 12.0 - 15.0 g/dL 8.6  9.0  9.1   Hematocrit 36.0 - 46.0 % 27.6  28.3  28.1   Platelets 150 - 400 K/uL 143  151  153       Latest Ref Rng & Units 09/14/2024    2:20 AM 09/13/2024    6:08 PM 09/13/2024    4:10 AM  CMP  Glucose 70 - 99 mg/dL 845  881  853   BUN 8 - 23 mg/dL 19  17  14    Creatinine 0.44 - 1.00 mg/dL 8.94  8.89  9.07   Sodium 135 - 145 mmol/L 135  138  140   Potassium 3.5 - 5.1 mmol/L 4.3  4.2  4.3   Chloride 98 - 111 mmol/L 101  104  107   CO2 22 - 32 mmol/L 25  26  24    Calcium  8.9 - 10.3 mg/dL 8.9  8.7  8.2     CXR: No large effusion or pneumothorax  Assessment/Plan: S/P  Procedures (LRB): CORONARY ARTERY BYPASS GRAFTING TIMES TWO, USING LEFT INTERNAL MAMMARY ARTERY AND ENDOSCOPICALLY HARVESTED RIGHT GREATER SAPHENOUS VEIN (N/A) ECHOCARDIOGRAM, TRANSESOPHAGEAL, INTRAOPERATIVE (N/A) POD2 CABG x 2, doing well NEURO- intact  Pain control PRN  Deconditioned, needs PT/OT CV- in SR around 100 bpm             Remove pacing wires, remove central line RESP- Continued improved lung aeration             Continue IS, pulm hygiene, ambulation  Remove meds today RENAL- creatinine and lytes Ok  Weight today 192 from 182 preop             IV 40 x 1 lasix  this morning  Keep foley for now GI- Tolerating diet  BM: none since surgery Endo- BG well controlled Transition to ISS ID- PPX DVT ppx - SCD + HSQ 5000 TID after wires removed  Dispo: ICU   LOS: 2 days    Con RAMAN Daquawn Seelman 09/14/2024

## 2024-09-15 ENCOUNTER — Inpatient Hospital Stay (HOSPITAL_COMMUNITY)

## 2024-09-15 ENCOUNTER — Other Ambulatory Visit: Payer: Self-pay

## 2024-09-15 DIAGNOSIS — I25119 Atherosclerotic heart disease of native coronary artery with unspecified angina pectoris: Secondary | ICD-10-CM

## 2024-09-15 LAB — BASIC METABOLIC PANEL WITH GFR
Anion gap: 12 (ref 5–15)
BUN: 18 mg/dL (ref 8–23)
CO2: 26 mmol/L (ref 22–32)
Calcium: 8.9 mg/dL (ref 8.9–10.3)
Chloride: 98 mmol/L (ref 98–111)
Creatinine, Ser: 1.02 mg/dL — ABNORMAL HIGH (ref 0.44–1.00)
GFR, Estimated: 59 mL/min — ABNORMAL LOW
Glucose, Bld: 191 mg/dL — ABNORMAL HIGH (ref 70–99)
Potassium: 4.3 mmol/L (ref 3.5–5.1)
Sodium: 136 mmol/L (ref 135–145)

## 2024-09-15 LAB — GLUCOSE, CAPILLARY
Glucose-Capillary: 160 mg/dL — ABNORMAL HIGH (ref 70–99)
Glucose-Capillary: 164 mg/dL — ABNORMAL HIGH (ref 70–99)
Glucose-Capillary: 180 mg/dL — ABNORMAL HIGH (ref 70–99)
Glucose-Capillary: 149 mg/dL — ABNORMAL HIGH (ref 70–99)

## 2024-09-15 LAB — CBC
HCT: 28.9 % — ABNORMAL LOW (ref 36.0–46.0)
Hemoglobin: 9.2 g/dL — ABNORMAL LOW (ref 12.0–15.0)
MCH: 29.9 pg (ref 26.0–34.0)
MCHC: 31.8 g/dL (ref 30.0–36.0)
MCV: 93.8 fL (ref 80.0–100.0)
Platelets: 172 K/uL (ref 150–400)
RBC: 3.08 MIL/uL — ABNORMAL LOW (ref 3.87–5.11)
RDW: 13.5 % (ref 11.5–15.5)
WBC: 11.9 K/uL — ABNORMAL HIGH (ref 4.0–10.5)
nRBC: 0.2 % (ref 0.0–0.2)

## 2024-09-15 MED ORDER — FUROSEMIDE 10 MG/ML IJ SOLN
40.0000 mg | Freq: Once | INTRAMUSCULAR | Status: AC
  Administered 2024-09-15: 40 mg via INTRAVENOUS
  Filled 2024-09-15: qty 4

## 2024-09-15 MED ORDER — LACTULOSE 10 GM/15ML PO SOLN
30.0000 g | Freq: Once | ORAL | Status: AC
  Administered 2024-09-15: 30 g via ORAL
  Filled 2024-09-15: qty 45

## 2024-09-15 MED ORDER — POTASSIUM CHLORIDE CRYS ER 20 MEQ PO TBCR
20.0000 meq | EXTENDED_RELEASE_TABLET | Freq: Once | ORAL | Status: AC
  Administered 2024-09-15: 20 meq via ORAL
  Filled 2024-09-15: qty 1

## 2024-09-15 NOTE — Progress Notes (Signed)
" ° °  [address], [place] [address]             [phone]  3 Days Post-Op Procedures (LRB): CORONARY ARTERY BYPASS GRAFTING TIMES TWO, USING LEFT INTERNAL MAMMARY ARTERY AND ENDOSCOPICALLY HARVESTED RIGHT GREATER SAPHENOUS VEIN (N/A) ECHOCARDIOGRAM, TRANSESOPHAGEAL, INTRAOPERATIVE (N/A) Subjective: Transferred from ICU yesterday Sitting up in bed, awake and alert but disoriented to time.  No BM since surgery. O2 at 1L/[place]  Objective: Vital signs in last 24 hours: Temp:  [97.6 F (36.4 C)-99.8 F (37.7 C)] 97.9 F (36.6 C) (12/19 0356) Pulse Rate:  [97-111] 104 (12/19 0356) Cardiac Rhythm: Sinus tachycardia (12/18 2210) Resp:  [8-24] 15 (12/19 0356) BP: (101-150)/(50-78) 133/62 (12/19 0356) SpO2:  [91 %-98 %] 97 % (12/19 0356) Weight:  [87 kg] 87 kg (12/19 0326)    Intake/Output from previous day: 12/18 0701 - 12/19 0700 In: 6 [I.V.:6] Out: 1505 [Urine:1425; Chest Tube:80] Intake/Output this shift: No intake/output data recorded.  General appearance: alert, cooperative, and confused Neurologic: moves all extremities, no focal deficits. Heart: ST, regular rhythm Lungs: clear breath sounds, normal resp effort.  Abdomen: soft, no tenderness Extremities: RLE EVH incision is intact and dry, trace peripheral edema.  Wound: The sternotomy incision has a Prevena negative pressure dressing in place, compressed.   Lab Results: Recent Labs    09/14/24 0220 09/14/24 1052 09/15/24 0407  WBC 13.7*  --  11.9*  HGB 8.6* 9.2* 9.2*  HCT 27.6* 27.0* 28.9*  PLT 143*  --  172   BMET:  Recent Labs    09/14/24 0220 09/14/24 1052 09/15/24 0407  NA 135 136 136  K 4.3 4.1 4.3  CL 101  --  98  CO2 25  --  26  GLUCOSE 154*  --  191*  BUN 19  --  18  CREATININE 1.05*  --  1.02*  CALCIUM  8.9  --  8.9    PT/INR:  Recent Labs    09/12/24 1356  LABPROT 18.2*  INR 1.4*   ABG    Component Value Date/Time   PHART 7.426 09/14/2024 1052   HCO3 25.1  09/14/2024 1052   TCO2 26 09/14/2024 1052   ACIDBASEDEF 5.0 (H) 09/12/2024 2035   O2SAT 95 09/14/2024 1052   CBG (last 3)  Recent Labs    09/14/24 1600 09/14/24 2240 09/15/24 0609  GLUCAP 157* 160* 160*    Assessment/Plan: S/P Procedures (LRB): CORONARY ARTERY BYPASS GRAFTING TIMES TWO, USING LEFT INTERNAL MAMMARY ARTERY AND ENDOSCOPICALLY HARVESTED RIGHT GREATER SAPHENOUS VEIN (N/A) ECHOCARDIOGRAM, TRANSESOPHAGEAL, INTRAOPERATIVE (N/A)  -POD3 CABG x 3. Stable VS and cardiac rhythm. CT drainage has tapered off. Will remove the chest tube and Foley catheter today.   -PULM- normal resp effort, continue to encourage pulm hygiene and wean the O2 off.  -GI- tolerating PO's, no BM since surgery. Lactulose  today.   -RENAL- normal function. Wt is still 4kg+.  Lasix  40mg  IV this morning.   -HEME- expected ABL anemia. Hct is trending up.   -ENDO- type 2 DM and hypothyroidism: CBG's controlled on Lantus  with no additional coverage required. She is back on her levothyroxine .   -Dispo-PT and OT recommended CIR at discharge but she declined. Will continue to work on mobility with PT and OT.    LOS: 3 days    Sostenes Kauffmann G. Karnisha Lefebre, PA-C 09/15/2024   "

## 2024-09-15 NOTE — Progress Notes (Signed)
 Inpatient Rehab Admissions Coordinator:    Pt. Continues to refuse CIR.  I will sign off.   Leita Kleine, MS, CCC-SLP Rehab Admissions Coordinator  [phone] (celll) [phone] (office)

## 2024-09-15 NOTE — Progress Notes (Signed)
 Rehab / SNF placement: Pt is refusing placement, stating she wants to go home.   Pt's husband at bedside concerned about her confusion and states it is new since the surgery.  He states she absolutely needs rehab and he is familiar with PT. OT, ST because of other people who had it in the family.   He states her room and everything is upstairs, that they also have 2 dogs at the house- 1 dog is 80 lbs and the other is a little lap dog that walks all over her chest.

## 2024-09-15 NOTE — Progress Notes (Addendum)
 Confusion: Pt noted confused most of the day believing she was anywhere from at the fire station across the street, up stairs at her house and at times, at [facility] because she had a CABG.  Multiple incidences today:  -attempting to get out recliner chair sitting on leg rest.  -removed telebox  -unhooked tubing from wound vac -telling this nurse to go down stairs and get the batteries out of the dish. -believing that some jewelry was stolen and it is some other guy's fault. -found trying to get out of bed- stating she was going home. 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 Pt is easily reoriented and cooperative.

## 2024-09-15 NOTE — Progress Notes (Signed)
 Physical Therapy Treatment Patient Details Name: Anna Noble MRN: [id number] DOB: 16-Nov-1955 Today's Date: 09/15/2024   History of Present Illness Pt is a 68 y.o female admitted 12/16 for CABG x2. PMH: HTN, HLD, hypothyroidism, DM, MI, stent LAD, CAD, HFmrEF, dysmetabolic syndrome X, neuromuscular disorder    PT Comments  Pt resting in bed on arrival, agreeable to session with encouragement as pt with noted confusion, disoriented to place, situation and time. Pt needing constant and explicit cues to maintain sternal precautions throughout mobility. Pt requiring grossly mod A to complete bed mobility and transfers sit<>stand with step by step multimodal cueing to complete. Pt demonstrating gait with RW for support and min A to maintain balance. Educated pt on importance of frequent mobilization to maximize functional mobility gains. Current plan remains appropriate to address deficits and maximize functional independence and safety. Pt continues to benefit from skilled PT services to progress toward functional mobility goals.    112/68 [phone] 98-124bpm with mobility SpO2 97% on RA   If plan is discharge home, recommend the following: A lot of help with walking and/or transfers;A lot of help with bathing/dressing/bathroom;Assistance with cooking/housework;Assist for transportation;Help with stairs or ramp for entrance   Can travel by private vehicle        Equipment Recommendations  BSC/3in1    Recommendations for Other Services       Precautions / Restrictions Precautions Precautions: Fall;Sternal Precaution Booklet Issued: No Recall of Precautions/Restrictions: Impaired Precaution/Restrictions Comments: wound vac chest; Y chest tube; watch SpO2; reviewed precautions Restrictions Weight Bearing Restrictions Per Provider Order: Yes Other Position/Activity Restrictions: sternal precautions     Mobility  Bed Mobility Overal bed mobility: Needs Assistance Bed Mobility: Rolling,  Sidelying to Sit Rolling: Min assist Sidelying to sit: Mod assist       General bed mobility comments: Dense cueing for maintaining sternal precautions with log roll technique, mod A to elevate trunk to sitting    Transfers Overall transfer level: Needs assistance Equipment used: Rolling walker (2 wheels) Transfers: Sit to/from Stand Sit to Stand: Mod assist           General transfer comment: mod assist to boost from EOB  Required x2 trials d/t weakness and posterior lean on rise    Ambulation/Gait Ambulation/Gait assistance: Min assist Gait Distance (Feet): 60 Feet Assistive device: Rolling walker (2 wheels) Gait Pattern/deviations: Step-through pattern, Decreased step length - right, Decreased step length - left, Decreased stride length, Trunk flexed Gait velocity: reduced     General Gait Details: Pt needed repeated cues to stand upright rather than flex at trunk and elevate shoulders. She takes slow, small steps, needing minA to balance and manage RW in tigh spaces   Stairs             Wheelchair Mobility     Tilt Bed    Modified Rankin (Stroke Patients Only)       Balance Overall balance assessment: Needs assistance Sitting-balance support: No upper extremity supported, Feet supported Sitting balance-Leahy Scale: Fair Sitting balance - Comments: static sitting EOB with CGA for safety   Standing balance support: Bilateral upper extremity supported, Reliant on assistive device for balance, During functional activity Standing balance-Leahy Scale: Poor Standing balance comment: reliant on RW                            Communication Communication Communication: No apparent difficulties  Cognition Arousal: Alert Behavior During Therapy: [facility] for tasks assessed/performed  PT - Cognitive impairments: Orientation, Awareness, Memory, Problem solving, Safety/Judgement   Orientation impairments: Place, Time, Situation                    PT - Cognition Comments: pt stating she was at the fire station across from her home, needing repeated reorientation to being in the hospital and have surgery Following commands: Impaired Following commands impaired: Follows multi-step commands with increased time    Cueing Cueing Techniques: Verbal cues, Tactile cues  Exercises      General Comments General comments (skin integrity, edema, etc.): HR up to 124bpm with activity      Pertinent Vitals/Pain Pain Assessment Pain Assessment: Faces Faces Pain Scale: Hurts little more Pain Location: sternum Pain Descriptors / Indicators: Discomfort, Grimacing, Guarding, Operative site guarding, Sore Pain Intervention(s): Monitored during session, Limited activity within patient's tolerance    Home Living                          Prior Function            PT Goals (current goals can now be found in the care plan section) Acute Rehab PT Goals Patient Stated Goal: to improve PT Goal Formulation: With patient Time For Goal Achievement: 09/27/24 Progress towards PT goals: Progressing toward goals    Frequency    Min 3X/week      PT Plan      Co-evaluation              AM-PAC PT 6 Clicks Mobility   Outcome Measure  Help needed turning from your back to your side while in a flat bed without using bedrails?: A Lot Help needed moving from lying on your back to sitting on the side of a flat bed without using bedrails?: A Lot Help needed moving to and from a bed to a chair (including a wheelchair)?: A Lot Help needed standing up from a chair using your arms (e.g., wheelchair or bedside chair)?: A Lot Help needed to walk in hospital room?: A Little Help needed climbing 3-5 steps with a railing? : Total 6 Click Score: 12    End of Session   Activity Tolerance: Patient tolerated treatment well;Patient limited by fatigue;Patient limited by pain Patient left: in chair;with call bell/phone within reach;with  chair alarm set Nurse Communication: Mobility status;Other (comment) (pt orientation) PT Visit Diagnosis: Unsteadiness on feet (R26.81);Other abnormalities of gait and mobility (R26.89);Muscle weakness (generalized) (M62.81);Difficulty in walking, not elsewhere classified (R26.2);Pain Pain - Right/Left:  (sternum) Pain - part of body:  (sternum)     Time: 8945-8877 PT Time Calculation (min) (ACUTE ONLY): 28 min  Charges:    $Gait Training: 8-22 mins $Therapeutic Activity: 8-22 mins PT General Charges $$ ACUTE PT VISIT: 1 Visit                     Lylah Lantis R. PTA Acute Rehabilitation Services Office: [phone]   Anna Noble CHRISTELLA Boor 09/15/2024, 11:51 AM

## 2024-09-15 NOTE — Care Management Important Message (Signed)
 Important Message  Patient Details  Name: Anna Noble MRN: [id number] Date of Birth: 05/04/1956   Important Message Given:  Yes - Medicare IM     [name] 09/15/2024, 11:16 AM

## 2024-09-16 LAB — GLUCOSE, CAPILLARY
Glucose-Capillary: 139 mg/dL — ABNORMAL HIGH (ref 70–99)
Glucose-Capillary: 168 mg/dL — ABNORMAL HIGH (ref 70–99)
Glucose-Capillary: 154 mg/dL — ABNORMAL HIGH (ref 70–99)

## 2024-09-16 LAB — CBC
HCT: 28.4 % — ABNORMAL LOW (ref 36.0–46.0)
Hemoglobin: 9.2 g/dL — ABNORMAL LOW (ref 12.0–15.0)
MCH: 30 pg (ref 26.0–34.0)
MCHC: 32.4 g/dL (ref 30.0–36.0)
MCV: 92.5 fL (ref 80.0–100.0)
Platelets: 213 K/uL (ref 150–400)
RBC: 3.07 MIL/uL — ABNORMAL LOW (ref 3.87–5.11)
RDW: 13.4 % (ref 11.5–15.5)
WBC: 8.1 K/uL (ref 4.0–10.5)
nRBC: 0.2 % (ref 0.0–0.2)

## 2024-09-16 LAB — BASIC METABOLIC PANEL WITH GFR
Anion gap: 12 (ref 5–15)
BUN: 14 mg/dL (ref 8–23)
CO2: 26 mmol/L (ref 22–32)
Calcium: 9 mg/dL (ref 8.9–10.3)
Chloride: 98 mmol/L (ref 98–111)
Creatinine, Ser: 0.86 mg/dL (ref 0.44–1.00)
GFR, Estimated: >60 mL/min
Glucose, Bld: 180 mg/dL — ABNORMAL HIGH (ref 70–99)
Potassium: 3.4 mmol/L — ABNORMAL LOW (ref 3.5–5.1)
Sodium: 136 mmol/L (ref 135–145)

## 2024-09-16 MED ORDER — POTASSIUM CHLORIDE CRYS ER 20 MEQ PO TBCR
20.0000 meq | EXTENDED_RELEASE_TABLET | Freq: Every day | ORAL | Status: DC
  Administered 2024-09-17 – 2024-09-18 (×2): 20 meq via ORAL
  Filled 2024-09-16 (×2): qty 1

## 2024-09-16 MED ORDER — METOPROLOL TARTRATE 25 MG/10 ML ORAL SUSPENSION: 25.0000 mg | Freq: Two times a day (BID) | ORAL | Status: DC

## 2024-09-16 MED ORDER — POTASSIUM CHLORIDE CRYS ER 20 MEQ PO TBCR
40.0000 meq | EXTENDED_RELEASE_TABLET | Freq: Two times a day (BID) | ORAL | Status: AC
  Administered 2024-09-16 (×2): 40 meq via ORAL
  Filled 2024-09-16 (×2): qty 2

## 2024-09-16 MED ORDER — FUROSEMIDE 40 MG PO TABS
40.0000 mg | ORAL_TABLET | Freq: Every day | ORAL | Status: DC
  Administered 2024-09-16 – 2024-09-18 (×3): 40 mg via ORAL
  Filled 2024-09-16 (×3): qty 1

## 2024-09-16 MED ORDER — SORBITOL 70 % SOLN
30.0000 mL | Freq: Once | Status: DC
  Filled 2024-09-16: qty 30

## 2024-09-16 MED ORDER — METOPROLOL TARTRATE 25 MG PO TABS
25.0000 mg | ORAL_TABLET | Freq: Two times a day (BID) | ORAL | Status: DC
  Administered 2024-09-16 (×2): 25 mg via ORAL
  Filled 2024-09-16 (×2): qty 1

## 2024-09-16 NOTE — Plan of Care (Signed)
   Problem: Education: Goal: Knowledge of General Education information will improve Description: Including pain rating scale, medication(s)/side effects and non-pharmacologic comfort measures Outcome: Progressing   Problem: Clinical Measurements: Goal: Will remain free from infection Outcome: Progressing

## 2024-09-16 NOTE — Progress Notes (Addendum)
 "                 [address]           Anna Noble [place], [place] [address]                     [phone]        4 Days Post-Op Procedures (LRB): CORONARY ARTERY BYPASS GRAFTING TIMES TWO, USING LEFT INTERNAL MAMMARY ARTERY AND ENDOSCOPICALLY HARVESTED RIGHT GREATER SAPHENOUS VEIN (N/A) ECHOCARDIOGRAM, TRANSESOPHAGEAL, INTRAOPERATIVE (N/A)  Subjective: Patient given Lactulose  but no bowel movement yet. She denies abdominal pain or nausea  Objective: Vital signs in last 24 hours: Temp:  [97.8 F (36.6 C)-98.2 F (36.8 C)] 97.8 F (36.6 C) (12/20 0300) Pulse Rate:  [106-126] 106 (12/20 0300) Cardiac Rhythm: Sinus tachycardia (12/19 1900) Resp:  [14-24] 20 (12/20 0645) BP: (112-150)/(65-80) 143/80 (12/20 0300) SpO2:  [93 %-100 %] 100 % (12/20 0300) Weight:  [83.5 kg] 83.5 kg (12/20 0645)  Pre op weight 83 kg Current Weight  09/16/24 83.5 kg      Intake/Output from previous day: 12/19 0701 - 12/20 0700 In: 120 [P.O.:120] Out: 1600 [Urine:1600]   Physical Exam:  Cardiovascular: Slightly tachycardic Pulmonary: Slightly diminished bibasilar breath sounds Abdomen: Soft, non tender, bowel sounds present. Extremities: Trace bilateral lower extremity edema. Wounds: Right leg wound is clean and dry.  No erythema or signs of infection. Prevena intact sternal wound  Lab Results: CBC: Recent Labs    09/15/24 0407 09/16/24 0330  WBC 11.9* 8.1  HGB 9.2* 9.2*  HCT 28.9* 28.4*  PLT 172 213   BMET:  Recent Labs    09/15/24 0407 09/16/24 0330  NA 136 136  K 4.3 3.4*  CL 98 98  CO2 26 26  GLUCOSE 191* 180*  BUN 18 14  CREATININE 1.02* 0.86  CALCIUM  8.9 9.0    PT/INR:  Lab Results  Component Value Date   INR 1.4 (H) 09/12/2024   INR 1.0 09/08/2024   INR 1.0 01/02/2020   ABG:  INR: Will add last result for INR, ABG once components are confirmed Will add last 4 CBG results once components are confirmed  Assessment/Plan:  1. CV - PVCs,ST with  HR in the 110's this am. She had HR up into the 120'+ earlier this am. On Lopressor  12.5 mg bid and will increase for better BP/HR control. 2.  Pulmonary - On room air. Encourage incentive spirometer. 3. Above pre op weight, requires further diuresis-on Lasix  40 mg orally daily 4.  Expected post op acute blood loss anemia - H and H this am stable at 9.2 and 28.4 5. Confusion-multiple episodes documented yesterday. Patient is easily re oriented and not combative. Will stop Oxy (not taking). No neuroligic deficit. Neurontin  already decreased to 300 mg at bedtime. 6. DM-CBGs 180/149/139. On Insulin . She was on Farxiga and Ozempic prior to surgery. Pre op HGA1C 7.9. She will need close medical follow up with PCP after discharge 7. Supplement potassium 8. History of hypothyroidism-on Levothyroxine  25 mcg daily 9. LOC constipation 10. Will likely remove Prevena in 1-2 days 11. Disposition-PT/OT recommended CIR but patient refused CIR . After discussion with patient this am, she is willing to go to SNF (for 2 weeks only). Dr. Daniel will discuss with patient about going to CIR again.  Anna M ZimmermanPA-C 7:43 AM  Looks great this morning. Reports ambulating independently to and from the bathroom.  AO x 4.  A  little tachy and HTN so will increase metop. Continue diuresis although basically at pre-op weight with no edema.  Has not had BM so will work on that today.  Amenable to CIR but seems like she is much better from a functional standpoint.  Hopefully physical therapy and come re-evaluate her today.  Anna Clunes, MD Cardiothoracic Surgery Pager: [phone]  "

## 2024-09-16 NOTE — Progress Notes (Signed)
 NT Amy called to let me know that pt had removed trac pad from provena wound vac. Called PA Donielle and made aware, Will leave dressing on until Dr. Daniel sees patient unless she takes it off. Ann Nena Hoard, RN

## 2024-09-16 NOTE — Progress Notes (Signed)
 Inpatient Rehab Admissions Coordinator:   Per RN, Pt. Continues to require assist for OOB, Pt. Now open to CIR. I will send case to insurance.   Leita Kleine, MS, CCC-SLP Rehab Admissions Coordinator  [phone] (celll) [phone] (office)

## 2024-09-16 NOTE — Progress Notes (Addendum)
 Pt ambulated x 350 feet in hall with front wheel walker, with slow steady gate pt tolerated well HR does increase to 125-130 when ambulating

## 2024-09-17 LAB — BASIC METABOLIC PANEL WITH GFR
Anion gap: 11 (ref 5–15)
BUN: 15 mg/dL (ref 8–23)
CO2: 26 mmol/L (ref 22–32)
Calcium: 9.1 mg/dL (ref 8.9–10.3)
Chloride: 96 mmol/L — ABNORMAL LOW (ref 98–111)
Creatinine, Ser: 0.82 mg/dL (ref 0.44–1.00)
GFR, Estimated: >60 mL/min
Glucose, Bld: 169 mg/dL — ABNORMAL HIGH (ref 70–99)
Potassium: 4.2 mmol/L (ref 3.5–5.1)
Sodium: 133 mmol/L — ABNORMAL LOW (ref 135–145)

## 2024-09-17 LAB — GLUCOSE, CAPILLARY
Glucose-Capillary: 158 mg/dL — ABNORMAL HIGH (ref 70–99)
Glucose-Capillary: 251 mg/dL — ABNORMAL HIGH (ref 70–99)
Glucose-Capillary: 82 mg/dL (ref 70–99)
Glucose-Capillary: 167 mg/dL — ABNORMAL HIGH (ref 70–99)

## 2024-09-17 MED ORDER — BISOPROLOL FUMARATE 5 MG PO TABS
5.0000 mg | ORAL_TABLET | Freq: Every day | ORAL | Status: DC
  Administered 2024-09-17: 5 mg via ORAL
  Filled 2024-09-17 (×2): qty 1

## 2024-09-17 MED ORDER — METOPROLOL TARTRATE 50 MG PO TABS
50.0000 mg | ORAL_TABLET | Freq: Two times a day (BID) | ORAL | Status: DC
  Administered 2024-09-17: 50 mg via ORAL
  Filled 2024-09-17: qty 1

## 2024-09-17 MED ORDER — METOPROLOL TARTRATE 25 MG PO TABS: 37.5000 mg | ORAL_TABLET | Freq: Two times a day (BID) | ORAL | Status: DC

## 2024-09-17 NOTE — Progress Notes (Addendum)
 "                 [address]           [place], [place] [address]                     [phone]        5 Days Post-Op Procedures (LRB): CORONARY ARTERY BYPASS GRAFTING TIMES TWO, USING LEFT INTERNAL MAMMARY ARTERY AND ENDOSCOPICALLY HARVESTED RIGHT GREATER SAPHENOUS VEIN (N/A) ECHOCARDIOGRAM, TRANSESOPHAGEAL, INTRAOPERATIVE (N/A)  Subjective: Patient with two large bowel movements. She has no complaint this am  Objective: Vital signs in last 24 hours: Temp:  [97 F (36.1 C)-98.7 F (37.1 C)] 98.7 F (37.1 C) (12/21 0404) Pulse Rate:  [106-111] 107 (12/21 0404) Cardiac Rhythm: Sinus tachycardia (12/20 1900) Resp:  [16-20] 19 (12/21 0609) BP: (152-172)/(78-89) 172/87 (12/21 0404) SpO2:  [93 %-97 %] 97 % (12/21 0404) Weight:  [83.8 kg] 83.8 kg (12/21 0609)  Pre op weight 83 kg Current Weight  09/17/24 83.8 kg      Intake/Output from previous day: 12/20 0701 - 12/21 0700 In: 120 [P.O.:120] Out: 0    Physical Exam:  Cardiovascular: Slightly tachycardic Pulmonary: Slightly diminished bibasilar breath sounds Abdomen: Soft, non tender, bowel sounds present. Extremities: Trace bilateral lower extremity edema. Wounds: Right leg wound is clean and dry.  No erythema or signs of infection. Prevena intact (not suctioning as patient partially removed yesterday)  Lab Results: CBC: Recent Labs    09/15/24 0407 09/16/24 0330  WBC 11.9* 8.1  HGB 9.2* 9.2*  HCT 28.9* 28.4*  PLT 172 213   BMET:  Recent Labs    09/16/24 0330 09/17/24 0404  NA 136 133*  K 3.4* 4.2  CL 98 96*  CO2 26 26  GLUCOSE 180* 169*  BUN 14 15  CREATININE 0.86 0.82  CALCIUM  9.0 9.1    PT/INR:  Lab Results  Component Value Date   INR 1.4 (H) 09/12/2024   INR 1.0 09/08/2024   INR 1.0 01/02/2020   ABG:  INR: Will add last result for INR, ABG once components are confirmed Will add last 4 CBG results once components are confirmed  Assessment/Plan:  1. CV - PVCs,ST  with HR in the 110's this am. She had HR up into the 130's yesterday with ambulation. On Lopressor  25 mg bid so will increase to 50 mg bid. Dr. Daniel and I discussed that she was on Bisoprolol  10 (also hydrochlorothiazide  6.25=Ziac) prior to surgery. Will assess BP/HR after 4 pm, and if controlled with continue with Lopressor ;if not, will change to Bisoprolol  5 mg tonight. 2.  Pulmonary - On room air. Encourage incentive spirometer. 3. Above pre op weight, requires further diuresis-on Lasix  40 mg orally daily 4.  Expected post op acute blood loss anemia - Last H and H stable at 9.2 and 28.4 5. Confusion-possibly sundowning . Patient is easily re oriented and not combative. Will stop Oxy (not taking). No neuroligic deficit. Neurontin  already decreased to 300 mg at bedtime. 6. DM-CBGs 168/154/158. On Insulin . She was on Farxiga and Ozempic prior to surgery, both of which will be started at discharge. Pre op HGA1C 7.9. She will need close medical follow up with PCP after discharge 7. History of hypothyroidism-on Levothyroxine  25 mcg daily 8. On Heparin  [place]res[place]ow tid for DVT prophylaxis 9. Disposition-PT/OT recommended CIR but initially patient refused CIR . She is now willing to go to CIR. As discussed with  Dr. Daniel, patient now appears able to go home. She may be ready in am  Donielle M ZimmermanPA-C 6:52 AM  Doing well this morning.  Got out of chair and ambulated to restroom under my supervision with no assistance.  Weight is down to pre-op, BP and HR still elevated so will uptitrate metoprolol . PT re-evaluated and recommending HHPT.  Will continue to titrate metoprolol  and plan for home tomorrow.  Con Daniel, MD Cardiothoracic Surgery Pager: [phone]  "

## 2024-09-17 NOTE — Progress Notes (Signed)
 Inpatient Rehab Admissions Coordinator:    Insurance denied CIR and Pt. Now able to walk 90 ft at supervision level. PT recommending HH now. CIR will sign off.   Leita Kleine, MS, CCC-SLP Rehab Admissions Coordinator  [phone] (celll) [phone] (office)

## 2024-09-17 NOTE — Progress Notes (Signed)
 Physical Therapy Treatment Patient Details Name: Anna Noble MRN: [id number] DOB: Jan 24, 1956 Today's Date: 09/17/2024   History of Present Illness Pt is a 68 y.o female admitted 12/16 for CABG x2. PMH: HTN, HLD, hypothyroidism, DM, MI, stent LAD, CAD, HFmrEF, dysmetabolic syndrome X, neuromuscular disorder    PT Comments  Patient reports getting OOB independently, however noted HOB elevated (a feature she does not have at home). HOB flat without rail required min assist and cues to roll and then mod assist for side to sit. Pt able to scoot out to reach her feet to the floor with CGA. Sit to stand CGA with cues for walker management and sternal precautions. Climbed single step x4 reps with foot board as a rail with CGA with HR max 122 bpm. Patient and family report she has no steps to enter from the outside, however has 12 steps to get up to her bedroom/shower (6, landing, 6). Will have someone with her 24/7 on discharge home per family. Recommend followup HHPT on discharge to continue education on sternal precautions and safest mobility techniques.     If plan is discharge home, recommend the following: Assistance with cooking/housework;Assist for transportation;Help with stairs or ramp for entrance;A little help with walking and/or transfers;A little help with bathing/dressing/bathroom   Can travel by private vehicle        Equipment Recommendations  BSC/3in1    Recommendations for Other Services       Precautions / Restrictions Precautions Precautions: Fall;Sternal Precaution Booklet Issued: Yes (comment) Recall of Precautions/Restrictions: Impaired Precaution/Restrictions Comments: reviewed precautions Restrictions Other Position/Activity Restrictions: sternal precautions     Mobility  Bed Mobility Overal bed mobility: Needs Assistance Bed Mobility: Rolling, Sidelying to Sit Rolling: Min assist Sidelying to sit: Mod assist       General bed mobility comments: Dense cueing  for maintaining sternal precautions with log roll technique, mod A to elevate trunk to sitting    Transfers Overall transfer level: Needs assistance Equipment used: Rolling walker (2 wheels), None Transfers: Sit to/from Stand Sit to Stand: Contact guard assist           General transfer comment: vc for hand placement (on knees); from EOB with and without RW    Ambulation/Gait Ambulation/Gait assistance: Supervision Gait Distance (Feet): 90 Feet Assistive device: Rolling walker (2 wheels), None Gait Pattern/deviations: Step-through pattern, Decreased step length - right, Decreased step length - left, Decreased stride length, Trunk flexed Gait velocity: reduced     General Gait Details: initially walked in room without device and pt demonstrated fair balance; prior to walk in hallway she stated she wanted the RW   Stairs Stairs: Yes Stairs assistance: Contact guard assist Stair Management: One rail Left, Step to pattern, Forwards Number of Stairs: 4 General stair comments: single step at end of bed using foot board as a rail; HR up 122 and pt reported extreme fatigue and needing to sit and rest   Wheelchair Mobility     Tilt Bed    Modified Rankin (Stroke Patients Only)       Balance Overall balance assessment: Needs assistance Sitting-balance support: No upper extremity supported, Feet supported Sitting balance-Leahy Scale: Fair     Standing balance support: During functional activity, No upper extremity supported Standing balance-Leahy Scale: Fair                              Musician Communication: No apparent difficulties  Cognition  Arousal: Alert Behavior During Therapy: WFL for tasks assessed/performed                             Following commands: Intact      Cueing Cueing Techniques: Verbal cues, Tactile cues  Exercises      General Comments General comments (skin integrity, edema, etc.): Walking  HR 117; slow step climbing up to 122 bpm      Pertinent Vitals/Pain Pain Assessment Pain Assessment: Faces Faces Pain Scale: Hurts a little bit Pain Location: sternum Pain Descriptors / Indicators: Discomfort, Operative site guarding, Sore Pain Intervention(s): Limited activity within patient's tolerance, Monitored during session    Home Living                          Prior Function            PT Goals (current goals can now be found in the care plan section) Acute Rehab PT Goals Patient Stated Goal: to improve Time For Goal Achievement: 09/27/24 Potential to Achieve Goals: Good Progress towards PT goals: Progressing toward goals    Frequency    Min 3X/week      PT Plan      Co-evaluation              AM-PAC PT 6 Clicks Mobility   Outcome Measure  Help needed turning from your back to your side while in a flat bed without using bedrails?: A Little Help needed moving from lying on your back to sitting on the side of a flat bed without using bedrails?: A Lot Help needed moving to and from a bed to a chair (including a wheelchair)?: A Little Help needed standing up from a chair using your arms (e.g., wheelchair or bedside chair)?: A Little Help needed to walk in hospital room?: A Little Help needed climbing 3-5 steps with a railing? : A Little 6 Click Score: 17    End of Session Equipment Utilized During Treatment: Gait belt Activity Tolerance: Patient limited by fatigue Patient left: in chair;with call bell/phone within reach Nurse Communication: Mobility status PT Visit Diagnosis: Unsteadiness on feet (R26.81);Other abnormalities of gait and mobility (R26.89);Muscle weakness (generalized) (M62.81);Difficulty in walking, not elsewhere classified (R26.2);Pain Pain - Right/Left:  (sternum) Pain - part of body:  (sternum)     Time: 1030-1056 PT Time Calculation (min) (ACUTE ONLY): 26 min  Charges:    $Gait Training: 23-37 mins PT General  Charges $$ ACUTE PT VISIT: 1 Visit                      Macario RAMAN, PT Acute Rehabilitation Services  Office [phone]    Macario SHAUNNA Soja 09/17/2024, 11:42 AM

## 2024-09-18 ENCOUNTER — Inpatient Hospital Stay (HOSPITAL_COMMUNITY)

## 2024-09-18 ENCOUNTER — Other Ambulatory Visit (HOSPITAL_COMMUNITY): Payer: Self-pay

## 2024-09-18 LAB — GLUCOSE, CAPILLARY
Glucose-Capillary: 139 mg/dL — ABNORMAL HIGH (ref 70–99)
Glucose-Capillary: 202 mg/dL — ABNORMAL HIGH (ref 70–99)

## 2024-09-18 MED ORDER — CLOPIDOGREL BISULFATE 75 MG PO TABS
75.0000 mg | ORAL_TABLET | Freq: Every day | ORAL | 11 refills | Status: DC
  Filled 2024-09-18: qty 30, 30d supply, fill #0

## 2024-09-18 MED ORDER — OXYCODONE HCL 5 MG PO TABS
5.0000 mg | ORAL_TABLET | Freq: Four times a day (QID) | ORAL | 0 refills | Status: AC | PRN
  Filled 2024-09-18: qty 28, 7d supply, fill #0

## 2024-09-18 MED ORDER — BISOPROLOL FUMARATE 10 MG PO TABS
10.0000 mg | ORAL_TABLET | Freq: Every day | ORAL | Status: DC
  Administered 2024-09-18: 10 mg via ORAL
  Filled 2024-09-18: qty 1

## 2024-09-18 MED ORDER — CLOPIDOGREL BISULFATE 75 MG PO TABS
75.0000 mg | ORAL_TABLET | Freq: Every day | ORAL | Status: DC
  Administered 2024-09-18: 75 mg via ORAL
  Filled 2024-09-18: qty 1

## 2024-09-18 NOTE — Progress Notes (Signed)
 CARDIAC REHAB PHASE I   Post OHS education including site care, restrictions, heart healthy diabetic diet, sternal precautions, IS use at home, home needs at discharge, exercise guidelines, smoking cessation and CRP2 reviewed. All questions and concerns addressed. Will refer to [facility] for CRP2.   10:10-10:50 Anna JAYSON [place], RN BSN 09/18/2024 10:49 AM

## 2024-09-18 NOTE — Progress Notes (Signed)
 Occupational Therapy Treatment Patient Details Name: Anna Noble MRN: [id number] DOB: 21-Aug-1956 Today's Date: 09/18/2024   History of present illness Pt is a 68 y.o female admitted 12/16 for CABG x2. PMH: HTN, HLD, hypothyroidism, DM, MI, stent LAD, CAD, HFmrEF, dysmetabolic syndrome X, neuromuscular disorder   OT comments  Pt. Seen for skilled OT treatment session.  Pt. Able to complete toileting  with S and cues for maintaining sternal precautions when completing LB dressing/bathing.  Standing grooming task with S.  Ambulation w/o any DME no LOB noted.   Pts. Son present and RN ready for d/c paperwork review as pt. Left seated on eob with them.        If plan is discharge home, recommend the following:  A lot of help with walking and/or transfers;A lot of help with bathing/dressing/bathroom;Assistance with cooking/housework   Equipment Recommendations       Recommendations for Other Services      Precautions / Restrictions Precautions Precautions: Fall;Sternal Precaution/Restrictions Comments: reviewed precautions Restrictions Other Position/Activity Restrictions: sternal precautions       Mobility Bed Mobility               General bed mobility comments: in b.room upon arrival about to sit on the toilet    Transfers Overall transfer level: Needs assistance Equipment used: None Transfers: Sit to/from Stand, Bed to chair/wheelchair/BSC Sit to Stand: Supervision     Step pivot transfers: Supervision     General transfer comment: cues for maintaining precautions during LB ADLs     Balance                                           ADL either performed or assessed with clinical judgement   ADL Overall ADL's : Needs assistance/impaired     Grooming: Standing;Wash/dry hands;Supervision/safety       Lower Body Bathing: Supervison/ safety;Set up;Sit to/from stand Lower Body Bathing Details (indicate cue type and reason): peri areas      Lower Body Dressing: Minimal assistance;Sitting/lateral leans;Sit to/from stand Lower Body Dressing Details (indicate cue type and reason): assistance to load pants legs over no slip socks Toilet Transfer: Supervision/safety;Ambulation;Regular Teacher, Adult Education Details (indicate cue type and reason): cues for maintaining sternal precautions, pt. attempting to hold onto grab bar with LUE while completing peri area washing and pulling up pants Toileting- Clothing Manipulation and Hygiene: Supervision/safety;Sit to/from stand;Cueing for compensatory techniques       Functional mobility during ADLs: Supervision/safety General ADL Comments: pt. upset about urinary incontinence, states she may get some depends. reviewed that was a great idea to prevent her rushing, getting upset, and having to clean up and change clothes often.    Extremity/Trunk Assessment              Vision       Restaurant Manager, Fast Food Communication: No apparent difficulties   Cognition Arousal: Alert Behavior During Therapy: WFL for tasks assessed/performed               OT - Cognition Comments: emotional in the b.room after an episode of incontence, states she is frustrated and just ready to be home.                 Following commands: Intact Following commands impaired: Follows multi-step commands with increased time  Cueing   Cueing Techniques: Verbal cues, Tactile cues  Exercises      Shoulder Instructions       General Comments HR 110-119bpm with standing activity    Pertinent Vitals/ Pain       Pain Assessment Pain Assessment: No/denies pain  Home Living                                          Prior Functioning/Environment              Frequency  Min 2X/week        Progress Toward Goals  OT Goals(current goals can now be found in the care plan section)  Progress towards OT goals: Progressing toward  goals     Plan      Co-evaluation                 AM-PAC OT 6 Clicks Daily Activity     Outcome Measure   Help from another person eating meals?: None Help from another person taking care of personal grooming?: A Little Help from another person toileting, which includes using toliet, bedpan, or urinal?: Total Help from another person bathing (including washing, rinsing, drying)?: A Lot Help from another person to put on and taking off regular upper body clothing?: A Lot Help from another person to put on and taking off regular lower body clothing?: A Lot 6 Click Score: 14    End of Session    OT Visit Diagnosis: Unsteadiness on feet (R26.81);Other abnormalities of gait and mobility (R26.89);Muscle weakness (generalized) (M62.81)   Activity Tolerance Patient tolerated treatment well   Patient Left in bed;with nursing/sitter in room;with family/visitor present;Other (comment) (seated eob with rn and son present to review d/c paperwork)   Nurse Communication          Time: 8986-8976 OT Time Calculation (min): 10 min  Charges: OT General Charges $OT Visit: 1 Visit OT Treatments $Self Care/Home Management : 8-22 mins  Randall, COTA/L Acute Rehabilitation [phone]   CHRISTELLA Nest Lorraine-COTA/L  09/18/2024, 11:18 AM

## 2024-09-18 NOTE — TOC Transition Note (Addendum)
 Transition of Care [facility]) - Discharge Note   Patient Details  Name: OCIE STANZIONE MRN: [id number] Date of Birth: 1956/09/21  [facility] CM/SW Contact:  Waddell Barnie Rama, RN Phone Number: 09/18/2024, 10:12 AM   Clinical Narrative:    NCM spoke with Baker with [employer], she will let the rep know who is follow patient that she is for dc today,  they will follow per protocol.  HHRN, HHPT, HHOT.  [employer] will supply bsc to dc lounge.     Barriers to Discharge: Continued Medical Work up   Patient Goals and CMS Choice Patient states their goals for this hospitalization and ongoing recovery are:: Plans to return home once stable.          Discharge Placement                       Discharge Plan and Services Additional resources added to the After Visit Summary for     Discharge Planning Services: CM Consult Post Acute Care Choice: Home Health            DME Agency: NA                  Social Drivers of Health (SDOH) Interventions SDOH Screenings   Food Insecurity: No Food Insecurity (09/13/2024)  Housing: Low Risk (09/13/2024)  Transportation Needs: No Transportation Needs (09/13/2024)  Utilities: Not At Risk (09/13/2024)  Social Connections: Socially Integrated (09/13/2024)  Tobacco Use: High Risk (09/12/2024)     Readmission Risk Interventions     No data to display

## 2024-09-18 NOTE — Progress Notes (Signed)
" ° °   °              [address]           [place], [place] [address]                     [phone]        6 Days Post-Op Procedures (LRB): CORONARY ARTERY BYPASS GRAFTING TIMES TWO, USING LEFT INTERNAL MAMMARY ARTERY AND ENDOSCOPICALLY HARVESTED RIGHT GREATER SAPHENOUS VEIN (N/A) ECHOCARDIOGRAM, TRANSESOPHAGEAL, INTRAOPERATIVE (N/A)  Subjective: Alert and oriented, eager to return home today. No new concerns.   Objective: Vital signs in last 24 hours: Temp:  [98.2 F (36.8 C)-98.7 F (37.1 C)] 98.4 F (36.9 C) (12/22 0214) Pulse Rate:  [85-106] 99 (12/22 0214) Cardiac Rhythm: Sinus tachycardia (12/21 1923) Resp:  [14-20] 16 (12/22 0214) BP: (138-156)/(61-86) 155/78 (12/22 0214) SpO2:  [94 %-100 %] 94 % (12/22 0214) Weight:  [82.6 kg] 82.6 kg (12/22 0359)  Pre op weight 83 kg Current Weight  09/18/24 82.6 kg      Intake/Output from previous day: 12/21 0701 - 12/22 0700 In: 1080 [P.O.:1080] Out: -    Physical Exam:  Cardiovascular: SR, occasional sinus tach at 100-110.  Pulmonary: normal resp effort on RA, breath sounds full and clear Abdomen: Soft, non tender. Extremities: no significant peripheral edema Wounds: Right leg wound is clean and dry.  The sternotomy incision is intact and dry.   Lab Results: CBC: Recent Labs    09/16/24 0330  WBC 8.1  HGB 9.2*  HCT 28.4*  PLT 213   BMET:  Recent Labs    09/16/24 0330 09/17/24 0404  NA 136 133*  K 3.4* 4.2  CL 98 96*  CO2 26 26  GLUCOSE 180* 169*  BUN 14 15  CREATININE 0.86 0.82  CALCIUM  9.0 9.1    PT/INR:  Lab Results  Component Value Date   INR 1.4 (H) 09/12/2024   INR 1.0 09/08/2024   INR 1.0 01/02/2020   ABG:  INR: Will add last result for INR, ABG once components are confirmed Will add last 4 CBG results once components are confirmed  Assessment/Plan:   -POD 6 CABG x 2. Stable BP and cardiac rhythm. Back on her bisoprolol  and rosuvastatin .   No ASA due to allergy.    -PULM- normal resp effort on RA  -GI- tolerating diet, having bowel function.  -RENAL- normal renal function, near pre-op Wt. K+ 4.2 yesterday.   -HEME- expected ABL anemia, Hct stable.   -ENDO- type 2 DM. To resume her Farxiga and Ozempic at discharge. H/o hypothyroidism, on her levothyroxine .   -Dispo- appears ready for discharge to home with [facility] PT. Will arrange equipment per PT recs.   Kielan Dreisbach G. RoddenberryPA-C 7:38 AM   "

## 2024-09-18 NOTE — Progress Notes (Signed)
 Physical Therapy Treatment Patient Details Name: Anna Noble MRN: [id number] DOB: July 06, 1956 Today's Date: 09/18/2024   History of Present Illness Pt is a 68 y.o female admitted 12/16 for CABG x2. PMH: HTN, HLD, hypothyroidism, DM, MI, stent LAD, CAD, HFmrEF, dysmetabolic syndrome X, neuromuscular disorder    PT Comments  Pt up in room on arrival, agreeable to session, however truncated as transport arriving. Pt with fair recall for all precautions and able to verbally sequence log roll technique to complete bed mobility. Pt demonstrating transfers sit<>stand without RW with CGA for safety with light cues for hand placement. Pt ambulating in room and hall without AD support with no overt LOB with grossly CGA for safety. Pt was educated on continued walker use to maximize functional independence, safety, and decrease risk for falls as pt with mild R drift with fatigue, as well as appropriate activity progression, energy conservation techniques and importance of continued mobility with pt verbalizing understanding. Pt continues to benefit from skilled PT services to progress toward functional mobility goals.     If plan is discharge home, recommend the following: Assistance with cooking/housework;Assist for transportation;Help with stairs or ramp for entrance;A little help with walking and/or transfers;A little help with bathing/dressing/bathroom   Can travel by private vehicle        Equipment Recommendations  BSC/3in1    Recommendations for Other Services       Precautions / Restrictions Precautions Precautions: Fall;Sternal Precaution Booklet Issued: Yes (comment) Recall of Precautions/Restrictions: Intact Precaution/Restrictions Comments: reviewed precautions Restrictions Weight Bearing Restrictions Per Provider Order: Yes Other Position/Activity Restrictions: sternal precautions     Mobility  Bed Mobility Overal bed mobility: Needs Assistance             General bed  mobility comments: pt up in room on arrival, able to verbally sequencing log roll technique with good recall    Transfers Overall transfer level: Needs assistance Equipment used: None Transfers: Sit to/from Stand Sit to Stand: Contact guard assist           General transfer comment: light vcs for hand placement (on knees); from EOB without RW, good carryover for stand>sit on transport wheelchair    Ambulation/Gait Ambulation/Gait assistance: Supervision Gait Distance (Feet): 50 Feet Assistive device: None Gait Pattern/deviations: Step-through pattern, Decreased step length - right, Decreased step length - left, Decreased stride length, Trunk flexed Gait velocity: reduced     General Gait Details: some mild R drift without RW use, educated pt on benefits of UE support   Stairs         General stair comments: verbally reviewed safe technique   Wheelchair Mobility     Tilt Bed    Modified Rankin (Stroke Patients Only)       Balance Overall balance assessment: Needs assistance Sitting-balance support: No upper extremity supported, Feet supported Sitting balance-Leahy Scale: Fair Sitting balance - Comments: static sitting EOB with CGA for safety   Standing balance support: During functional activity, No upper extremity supported Standing balance-Leahy Scale: Fair Standing balance comment: no overt LOB with gait without UE support                            Communication Communication Communication: No apparent difficulties  Cognition Arousal: Alert Behavior During Therapy: WFL for tasks assessed/performed  Following commands: Intact      Cueing Cueing Techniques: Verbal cues, Tactile cues  Exercises      General Comments General comments (skin integrity, edema, etc.): HR 110-119bpm with standing activity      Pertinent Vitals/Pain Pain Assessment Pain Assessment: Faces Faces Pain Scale: Hurts a  little bit Pain Location: sternum Pain Descriptors / Indicators: Discomfort, Operative site guarding, Sore Pain Intervention(s): Monitored during session, Limited activity within patient's tolerance    Home Living                          Prior Function            PT Goals (current goals can now be found in the care plan section) Acute Rehab PT Goals Patient Stated Goal: to improve PT Goal Formulation: With patient Time For Goal Achievement: 09/27/24 Progress towards PT goals: Progressing toward goals    Frequency    Min 3X/week      PT Plan      Co-evaluation              AM-PAC PT 6 Clicks Mobility   Outcome Measure  Help needed turning from your back to your side while in a flat bed without using bedrails?: A Little Help needed moving from lying on your back to sitting on the side of a flat bed without using bedrails?: A Lot Help needed moving to and from a bed to a chair (including a wheelchair)?: A Little Help needed standing up from a chair using your arms (e.g., wheelchair or bedside chair)?: A Little Help needed to walk in hospital room?: A Little Help needed climbing 3-5 steps with a railing? : A Little 6 Click Score: 17    End of Session   Activity Tolerance: Patient tolerated treatment well;Other (comment) (limited by transport arrival for xray) Patient left: in chair;Other (comment) (in transport wheelchair) Nurse Communication: Mobility status PT Visit Diagnosis: Unsteadiness on feet (R26.81);Other abnormalities of gait and mobility (R26.89);Muscle weakness (generalized) (M62.81);Difficulty in walking, not elsewhere classified (R26.2);Pain Pain - Right/Left:  (sternum) Pain - part of body:  (sternum)     Time: 9164-9155 PT Time Calculation (min) (ACUTE ONLY): 9 min  Charges:    $Therapeutic Activity: 8-22 mins PT General Charges $$ ACUTE PT VISIT: 1 Visit                     Syaire Saber R. PTA Acute Rehabilitation  Services Office: [phone]   Therisa CHRISTELLA Boor 09/18/2024, 8:51 AM

## 2024-09-18 NOTE — Care Management Important Message (Signed)
 Important Message  Patient Details  Name: Anna Noble MRN: [id number] Date of Birth: 02-16-1956   Important Message Given:  Yes - Medicare IM     [name] 09/18/2024, 10:14 AM

## 2024-09-19 ENCOUNTER — Telehealth (HOSPITAL_COMMUNITY): Payer: Self-pay

## 2024-09-19 NOTE — Telephone Encounter (Signed)
 Received Cardiac Rehab Referral.  Called Patient. Patient is not interested

## 2024-09-23 LAB — TYPE AND SCREEN
ABO/RH(D): O NEG
Antibody Screen: NEGATIVE
Unit division: 0
Unit division: 0
Unit division: 0
Unit division: 0

## 2024-09-23 LAB — BPAM RBC
Blood Product Expiration Date: 202601012359
Blood Product Expiration Date: 202601022359
Blood Product Expiration Date: 202601032359
Blood Product Expiration Date: 202601032359
Blood Product Expiration Date: 202601142359
ISSUE DATE / TIME: 202512161105
ISSUE DATE / TIME: 202512161105
ISSUE DATE / TIME: 202512260158
Unit Type and Rh: 9500
Unit Type and Rh: 9500
Unit Type and Rh: 9500
Unit Type and Rh: 9500
Unit Type and Rh: 9500

## 2024-09-25 DIAGNOSIS — Z48812 Encounter for surgical aftercare following surgery on the circulatory system: Secondary | ICD-10-CM | POA: Diagnosis not present

## 2024-09-26 ENCOUNTER — Ambulatory Visit (INDEPENDENT_AMBULATORY_CARE_PROVIDER_SITE_OTHER)

## 2024-09-26 ENCOUNTER — Ambulatory Visit
Admission: RE | Admit: 2024-09-26 | Discharge: 2024-09-26 | Disposition: A | Discharge status: Home / Self Care | Source: Ambulatory Visit | Attending: Cardiovascular Disease | Admitting: Cardiovascular Disease

## 2024-09-26 VITALS — BP 130/71 | HR 99 | Resp 20 | Ht 67.0 in | Wt 182.0 lb

## 2024-09-26 DIAGNOSIS — I25119 Atherosclerotic heart disease of native coronary artery with unspecified angina pectoris: Secondary | ICD-10-CM | POA: Insufficient documentation

## 2024-09-26 DIAGNOSIS — Z951 Presence of aortocoronary bypass graft: Secondary | ICD-10-CM

## 2024-09-26 NOTE — Patient Instructions (Signed)
-  Follow up in 2 weeks with chest xray  You may continue to gradually increase your physical activity as tolerated.  Refrain from any heavy lifting or strenuous use of your arms and shoulders until at least 6 weeks from the time of your surgery, and avoid activities that cause increased pain in your chest on the side of your surgical incision.  Otherwise you may continue to increase activities without any particular limitations.  Increase the intensity and duration of physical activity gradually.

## 2024-09-26 NOTE — Progress Notes (Addendum)
 "     [address] [place] [address]             [phone]       HPI:  Patient returns for routine postoperative follow-up having undergone  CABG x 2 (SVG - PDA, LIMA - LAD) and endoscopic Vein Harvest  on 09/12/2024 with Dr. Daniel.  The patient's early postoperative recovery while in the hospital was notable for being routinely diuresed postoperatively.  She was resumed on home bisoprolol  10mg /hydrochlorothiazide  for heart rate and blood pressure control.  She was stable for discharge home on 09/18/2024 with home physical therapy.  Since hospital discharge the patient reports that she has been doing well.  She has been using tylenol  for pain.  She finds that she has more pain in the evening time and she takes oxycodone  as needed for this.  She has been active walking around her house and taking the stairs.  Her incisions have been healing appropriately.  She denies chest pain, shortness of breath and lower leg swelling.  She was vaping prior to surgery but has since stopped.    Allergies as of 09/26/2024       Reactions   Penicillins Anaphylaxis   Aspirin  Hives   Nsaids Hives   Latex Rash        Medication List        Accurate as of September 26, 2024 11:18 AM. If you have any questions, ask your nurse or doctor.          Accu-Chek Aviva Plus test strip Generic drug: glucose blood SMARTSIG:1 Strip(s) Via Meter Every Morning   Accu-Chek Aviva Plus w/Device Kit Use 1 kit as directed once a day  to check fasting blood sugar for diabetes   Accu-Chek Softclix Lancets lancets SMARTSIG:Topical   albuterol  108 (90 Base) MCG/ACT inhaler Commonly known as: VENTOLIN  HFA Inhale 2 puffs into the lungs every 4 (four) hours as needed for wheezing or shortness of breath (cough, shortness of breath or wheezing.).   ascorbic acid 500 MG tablet Commonly known as: VITAMIN C Take 500 mg by mouth daily.   bisoprolol -hydrochlorothiazide  10-6.25 MG  tablet Commonly known as: ZIAC Take 1 tablet by mouth daily.   CINNAMON PO Take 1,000 mg by mouth daily.   clopidogrel  75 MG tablet Commonly known as: PLAVIX  Take 1 tablet (75 mg total) by mouth daily. Per Dr Sherrlyn take Plavix  until 08/27/24 then discontinue   cyanocobalamin 1000 MCG tablet Commonly known as: VITAMIN B12 Take 1,000 mcg by mouth in the morning and at bedtime.   cyclobenzaprine  5 MG tablet Commonly known as: FLEXERIL  Take 5 mg by mouth at bedtime.   doxylamine (Sleep) 25 MG tablet Commonly known as: UNISOM Take 25 mg by mouth at bedtime.   DULoxetine  60 MG capsule Commonly known as: CYMBALTA  Take 60 mg by mouth daily.   ergocalciferol  1.25 MG (50000 UT) capsule Commonly known as: VITAMIN D2 Take 50,000 Units by mouth once a week.   Farxiga 10 MG Tabs tablet Generic drug: dapagliflozin propanediol Take 10 mg by mouth daily.   gabapentin  600 MG tablet Commonly known as: NEURONTIN  Take 600 mg by mouth at bedtime.   ipratropium 0.03 % nasal spray Commonly known as: ATROVENT  Place 2 sprays into both nostrils 2 (two) times daily as needed for rhinitis.   lansoprazole 30 MG capsule Commonly known as: PREVACID Take 30 mg by mouth daily.   levocetirizine 5 MG tablet Commonly  known as: XYZAL  Take 5 mg by mouth daily.   levothyroxine  25 MCG tablet Commonly known as: SYNTHROID  Take 25 mcg by mouth daily before breakfast.   Ozempic (2 MG/DOSE) 8 MG/3ML Sopn Generic drug: Semaglutide (2 MG/DOSE) Inject 2 mg into the skin once a week.   rosuvastatin  20 MG tablet Commonly known as: CRESTOR  Take 20 mg by mouth daily.   TURMERIC PO Take 1,000 mg by mouth daily. Complex         ROS Review of Systems  Constitutional: Negative.  Negative for fever and malaise/fatigue.  Respiratory: Negative.  Negative for cough and shortness of breath.   Cardiovascular:  Negative for chest pain, palpitations and leg swelling.      BP 130/71   Pulse 99    Resp 20   Ht 5' 7 (1.702 m)   Wt 182 lb (82.6 kg)   SpO2 95% Comment: RA  BMI 28.51 kg/m   Physical Exam Constitutional:      Appearance: Normal appearance.  HENT:     Head: Normocephalic and atraumatic.  Cardiovascular:     Rate and Rhythm: Normal rate and regular rhythm.     Heart sounds: Normal heart sounds, S1 normal and S2 normal.  Pulmonary:     Effort: Pulmonary effort is normal.     Breath sounds: Normal breath sounds.  Musculoskeletal:     Cervical back: Normal range of motion.     Right lower leg: No edema.     Left lower leg: No edema.  Skin:    General: Skin is warm and dry.      Neurological:     General: No focal deficit present.     Mental Status: She is alert.       Imaging: EXAM: 2 VIEW(S) XRAY OF THE CHEST 09/26/2024 10:54:13 AM   COMPARISON: 09/18/2024   CLINICAL HISTORY: CABG   FINDINGS:   LINES, TUBES AND DEVICES: none.   LUNGS AND PLEURA: Left basilar atelectasis. Decreased trace left pleural effusion, unchanged. No focal pulmonary opacity. No pneumothorax.   HEART AND MEDIASTINUM: No acute abnormality of the cardiac and mediastinal silhouettes.   BONES AND SOFT TISSUES: Sternotomy wires noted. No acute osseous abnormality.   IMPRESSION: 1. Persistent trace left pleural effusion, unchanged. 2. Left basilar atelectasis.   Electronically signed by: Morgane Naveau MD 09/26/2024 12:47 PM EST RP Workstation: [id number]   Assessment/Plan:  S/P CABG x 2 -We reviewed today's chest x ray, which showed stable trace left sided pleural effusion. She should continue to use her incentive spirometer. -She is to continue sternal precautions until a full 6 weeks from surgery -She is to keep the incision clean with soap and water , pat dry -She is to continue with tylenol  and oxycodone  as needed for pain -Increase activity as tolerated -Congratulated her on her vaping cessation and she should continue this -She has follow up with  cardiology on 10/07/2023 -She has follow up with PCP on 09/27/2024 for diabetes management -Follow up in 2 weeks with chest xray with Dr. Daniel Manuelita CHRISTELLA Rutha, PA-C 11:18 AM 09/26/2024  "

## 2024-10-05 NOTE — Progress Notes (Signed)
 " Cardiology Office Note:    Date:  10/06/2024   ID:  Anna Noble, DOB 1955/12/05, MRN [id number]  PCP:  Waddell Palma, PA-C   [facility] Providers Cardiologist:  Norleen CHRISTELLA Blare, MD     Referring MD: Waddell Palma, PA-C   Chief complaint: Follow-up post CABG     History of Present Illness:   Anna Noble is a 69 y.o. female with a hx of HTN, HLD, diabetes, presents today for follow-up of recent CABG surgery.  Known CAD from an MI in 2001 with PCI to LAD.  Repeat catheterization 08/14/2024 demonstrating stenosis of 80% and previously placed LAD stent, total occlusion of RCA fills via collaterals.  Referred to CVTS.  She presented to [facility] and received a CABG X2 with LIMA-LAD, SVG-PDA with endoscopic harvest of greater saphenous vein from right thigh.  Postop period was uneventful.  Entresto, Lasix , nitroglycerin , potassium stopped.  Restarted on bisoprolol /HCTZ 10-6.25 mg daily, Plavix  75 mg daily, Farxiga 10 mg daily, rosuvastatin  20 mg daily.  Presents independently, doing well from a cardiovascular standpoint. She denies chest pain, palpitations, dyspnea, orthopnea, n, v, dark/tarry/bloody stools, hematuria, dizziness, syncope, edema, weight gain.  She is no longer vaping.  Reports she lives in a split-level home that requires traversing stairs regularly, about 14 on average without exacerbation of any cardiovascular symptoms.  She is able to continue to do light work around her house, including washing the dishes and folding the laundry without issue.  She is eager to get back to driving at some point in the near future.  She reports her incisional sites are healing well, without issue.  She does have some concerns regarding her elevated blood sugar, and plans on following up with her PCP within the next week.  Will  ROS:   Please see the history of present illness.    All other systems reviewed and are negative.     Past Medical History:  Diagnosis Date    Abnormal Pap smear of cervix    ascus hpv hr+   Acute myocardial infarction of other anterior wall, episode of care unspecified    Allergy    Arthritis    CHF (congestive heart failure) (HCC)    Coronary atherosclerosis of native coronary artery    Depression    DM (diabetes mellitus) (HCC)    Dysmetabolic syndrome X    HLD (hyperlipidemia)    HTN (hypertension)    Hypothyroidism    Neuromuscular disorder (HCC)    Obesity, unspecified    Tobacco use disorder     Past Surgical History:  Procedure Laterality Date   ACHILLES TENDON SURGERY Left    CARDIAC CATHETERIZATION     Cardiac stenting     CORONARY ARTERY BYPASS GRAFT N/A 09/12/2024   Procedure: CORONARY ARTERY BYPASS GRAFTING TIMES TWO, USING LEFT INTERNAL MAMMARY ARTERY AND ENDOSCOPICALLY HARVESTED RIGHT GREATER SAPHENOUS VEIN;  Surgeon: Daniel Con RAMAN, MD;  Location: [facility] OR;  Service: Open Heart Surgery;  Laterality: N/A;   EYE SURGERY     HAND SURGERY Right    ganglion cyst, bone graft   INTRAOPERATIVE TRANSESOPHAGEAL ECHOCARDIOGRAM N/A 09/12/2024   Procedure: ECHOCARDIOGRAM, TRANSESOPHAGEAL, INTRAOPERATIVE;  Surgeon: Daniel Con RAMAN, MD;  Location: [facility] OR;  Service: Open Heart Surgery;  Laterality: N/A;   KNEE ARTHROSCOPY Right 1976   LEFT HEART CATH AND CORONARY ANGIOGRAPHY N/A 08/14/2024   Procedure: LEFT HEART CATH AND CORONARY ANGIOGRAPHY;  Surgeon: Anner Alm ORN, MD;  Location: [facility]  INVASIVE CV LAB;  Service: Cardiovascular;  Laterality: N/A;   STRABISMUS SURGERY Left 1959   TOE SURGERY Right    x 3   TUBAL LIGATION      Current Medications: Active Medications[1]   Allergies:   Penicillins, Aspirin , Nsaids, and Latex   Social History   Socioeconomic History   Marital status: Divorced    Spouse name: Dempsey   Number of children: 2   Years of education: Not on file   Highest education level: Not on file  Occupational History   Occupation: retired    Associate Professor: [employer]    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  Vaping Use   Vaping status: Some Days  Substance and Sexual Activity   Alcohol use: Not Currently    Comment: once a month-beer   Drug use: Yes    Types: Marijuana   Sexual activity: Not Currently    Partners: Male    Birth control/protection: Post-menopausal, Surgical    Comment: btl  Other Topics Concern   Not on file  Social History Narrative   Marital status: Divorced after 30 years of marriage in 2007.  Dating same gentleman/Frank x 9 years.   Children: 2 children, no grandchildren.  Oldest son with Down's syndrome.     Living: with boyfriend/Frank.   Employment:  Retired since 10/2016.    Tobacco: 1/2 ppd x 18 years.   Alcohol: 2 drinks on Thursday night/socially.   Drugs: none   Exercise: minimal walking in 2018 due to knee pain/OA.   Seatbelt: 100%; no texting      Social Drivers of Health   Tobacco Use: High Risk (10/06/2024)   Patient History    Smoking Tobacco Use: Former    Smokeless Tobacco Use: Current    Passive Exposure: Not on Actuary Strain: Not on file  Food Insecurity: No Food Insecurity (09/13/2024)   Epic    Worried About Programme Researcher, Broadcasting/film/video in the Last Year: Never true    Ran Out of Food in the Last Year: Never true  Transportation Needs: No Transportation Needs (09/13/2024)   Epic    Lack of Transportation (Medical): No    Lack of Transportation (Non-Medical): No  Physical Activity: Not on file  Stress: Not on file  Social Connections: Socially Integrated (09/13/2024)   Social Connection and Isolation Panel    Frequency of Communication with Friends and Family: More than three times a week    Frequency of Social Gatherings with Friends and  Family: More than three times a week    Attends Religious Services: More than 4 times per year    Active Member of [employer] or Organizations: Yes    Attends Banker Meetings: More than 4 times per year    Marital Status: Married  Depression (EYV7-0): Not on file  Alcohol Screen: Not on file  Housing: Low Risk (09/13/2024)   Epic    Unable to Pay for Housing in the Last Year: No    Number of Times Moved in the Last Year: 0    Homeless in the Last Year: No  Utilities: Not At Risk (09/13/2024)   Epic  Threatened with loss of utilities: No  Health Literacy: Not on file     Family History: The patient's family history includes Arthritis in her brother; Cancer in her father; Colon cancer (age of onset: 63) in her maternal aunt; Dementia in her mother; Depression in her father; Diabetes in her mother and sister; Down syndrome in her son; Heart disease (age of onset: 32) in her brother; Heart disease (age of onset: 1) in her father; Heart disease (age of onset: 82) in her sister; Hypertension in her brother, father, mother, and sister; Multiple sclerosis in her sister; Prostate cancer in her father.  EKGs/Labs/Other Studies Reviewed:    The following studies were reviewed today:  EKG Interpretation Date/Time:  Friday October 06 2024 08:41:37 EST Ventricular Rate:  93 PR Interval:  170 QRS Duration:  86 QT Interval:  354 QTC Calculation: 440 R Axis:   52  Text Interpretation: Normal sinus rhythm Old septal infarct T wave abnormality Otherwise no significant change Reconfirmed by Elaine Moloney [phone]) on 10/06/2024 9:32:32 AM    Recent Labs: 09/08/2024: ALT 9 09/13/2024: Magnesium  2.5 09/14/2024: TSH 5.490 09/16/2024: Hemoglobin 9.2; Platelets 213 09/17/2024: BUN 15; Creatinine, Ser 0.82; Potassium 4.2; Sodium 133  Recent Lipid Panel    Component Value Date/Time   CHOL 156 01/19/2018 1106   TRIG 250 (H) 01/19/2018 1106   HDL 45 01/19/2018 1106   CHOLHDL 3.5  01/19/2018 1106   CHOLHDL 4.1 05/05/2016 0925   VLDL 64 (H) 05/05/2016 0925   LDLCALC 61 01/19/2018 1106     Risk Assessment/Calculations:                Physical Exam:    VS:  BP 130/66 (BP Location: Left Arm, Patient Position: Sitting, Cuff Size: Normal)   Pulse 93   Ht 5' 7 (1.702 m)   Wt 181 lb 12.8 oz (82.5 kg)   SpO2 96%   BMI 28.47 kg/m        Wt Readings from Last 3 Encounters:  10/06/24 181 lb 12.8 oz (82.5 kg)  09/26/24 182 lb (82.6 kg)  09/18/24 182 lb 1.6 oz (82.6 kg)     GEN:  Well nourished, well developed in no acute distress HEENT: Normal NECK:  No carotid bruits CARDIAC:  S1-S2 normal, RRR, no murmurs, rubs, gallops RESPIRATORY:  Clear to auscultation without rales, wheezing or rhonchi  MUSCULOSKELETAL:  No edema; No deformity  SKIN: Warm and dry NEUROLOGIC:  Alert and oriented x 3 PSYCHIATRIC:  Normal affect       Assessment & Plan Coronary artery disease involving native coronary artery of native heart, unspecified whether angina present S/P CABG x 2 STEMI-PCI-LAD in 2001, LHC 08/14/2024: Total occlusion of RCA, LAD in-stent restenosis 80%. 09/12/2024 LIMA-LAD, SVG-PDA EKG: 93 bpm, NSR, old septal infarct, T wave abnormality, otherwise no significant change Denies chest pain, shortness of breath, orthopnea, edema Denies missing any doses of her Plavix , no ASA 2/2 allergies Incision appears to be healing well on chest and right thigh.  No redness, warmth, swelling, or drainage observed. Appears to be healing well, no cardiovascular symptoms, EKG did show new T wave abnormality.  Discussed this with DOD, Dr. Shlomo, given patient is asymptomatic will continue to monitor with close follow-up. Continue bisoprolol -hydrochlorothiazide  10-6.25, Plavix  75 mg daily, rosuvastatin  20 mg daily Heart failure with improved ejection fraction (HFimpEF) (HCC) Echo 08/25/2024: LVEF 40-45%, mildly decreased LV function, RWMA present, G1 DD Intraoperative TEE  09/12/2024: LVEF 55 to 60%, no RWMA, moderate concentric  LVH. Denies SOB, orthopnea, PND, leg edema, weight gain Appears euvolemic on assessment Weight is remained stable since discharge Able to perform all ADLs, regularly traverse 14 stairs, and perform light duties around the house without issue Continue bisoprolol -hydrochlorothiazide , Farxiga as above Essential hypertension, benign BPs reported well-controlled at home Continue bisoprolol -hydrochlorothiazide , Farxiga 10 mg daily Hyperlipidemia with target low density lipoprotein (LDL) cholesterol less than 55 mg/dL Update fasting lipid panel LFTs 09/08/2024: AST 22, ALT 9 Continue Crestor  20 mg daily  Disposition: Follow-up with gen cards in 1 month            Medication Adjustments/Labs and Tests Ordered: Current medicines are reviewed at length with the patient today.  Concerns regarding medicines are outlined above.  Orders Placed This Encounter  Procedures   Lipid panel   EKG 12-Lead   No orders of the defined types were placed in this encounter.   Patient Instructions  Medication Instructions:  NO CHANGES *If you need a refill on your cardiac medications before your next appointment, please call your pharmacy*  Lab Work: FASTING LIPID PANEL WITHIN 1 WEEK If you have labs (blood work) drawn today and your tests are completely normal, you will receive your results only by: MyChart Message (if you have MyChart) OR A paper copy in the mail If you have any lab test that is abnormal or we need to change your treatment, we will call you to review the results.  Testing/Procedures: NO TESTING  Follow-Up: At [facility], you and your health needs are our priority.  As part of our continuing mission to provide you with exceptional heart care, our providers are all part of one team.  This team includes your primary Cardiologist (physician) and Advanced Practice Providers or APPs (Physician Assistants and Nurse  Practitioners) who all work together to provide you with the care you need, when you need it.  Your next appointment:   1 month(s)  Provider:   Miriam Shams, NP   Other Instructions            Signed, Miriam FORBES Shams, NP  10/06/2024 9:32 AM    [facility]     [1]  Current Meds  Medication Sig   ACCU-CHEK AVIVA PLUS test strip SMARTSIG:1 Strip(s) Via Meter Every Morning   Accu-Chek Softclix Lancets lancets SMARTSIG:Topical   albuterol  (PROVENTIL  HFA;VENTOLIN  HFA) 108 (90 Base) MCG/ACT inhaler Inhale 2 puffs into the lungs every 4 (four) hours as needed for wheezing or shortness of breath (cough, shortness of breath or wheezing.).   ascorbic acid (VITAMIN C) 500 MG tablet Take 500 mg by mouth daily.   bisoprolol -hydrochlorothiazide  (ZIAC) 10-6.25 MG tablet Take 1 tablet by mouth daily.   Blood Glucose Monitoring Suppl (ACCU-CHEK AVIVA PLUS) w/Device KIT Use 1 kit as directed once a day  to check fasting blood sugar for diabetes   CINNAMON PO Take 1,000 mg by mouth daily.   clopidogrel  (PLAVIX ) 75 MG tablet Take 1 tablet (75 mg total) by mouth daily. Per Dr Sherrlyn take Plavix  until 08/27/24 then discontinue   cyanocobalamin (VITAMIN B12) 1000 MCG tablet Take 1,000 mcg by mouth in the morning and at bedtime.   cyclobenzaprine  (FLEXERIL ) 5 MG tablet Take 5 mg by mouth at bedtime.   dapagliflozin propanediol (FARXIGA) 10 MG TABS tablet Take 10 mg by mouth daily.   doxylamine, Sleep, (UNISOM) 25 MG tablet Take 25 mg by mouth at bedtime.   DULoxetine  (CYMBALTA ) 60 MG capsule Take 60 mg by mouth daily.  ergocalciferol  (VITAMIN D2) 1.25 MG (50000 UT) capsule Take 50,000 Units by mouth once a week.   gabapentin  (NEURONTIN ) 600 MG tablet Take 600 mg by mouth at bedtime.   ipratropium (ATROVENT ) 0.03 % nasal spray Place 2 sprays into both nostrils 2 (two) times daily as needed for rhinitis.   lansoprazole (PREVACID) 30 MG capsule Take 30 mg by mouth daily.    levocetirizine (XYZAL ) 5 MG tablet Take 5 mg by mouth daily.   levothyroxine  (SYNTHROID ) 25 MCG tablet Take 25 mcg by mouth daily before breakfast.   OZEMPIC, 2 MG/DOSE, 8 MG/3ML SOPN Inject 2 mg into the skin once a week.   rosuvastatin  (CRESTOR ) 20 MG tablet Take 20 mg by mouth daily.   TURMERIC PO Take 1,000 mg by mouth daily. Complex   "

## 2024-10-06 ENCOUNTER — Encounter: Payer: Self-pay | Admitting: Emergency Medicine

## 2024-10-06 ENCOUNTER — Ambulatory Visit: Attending: Emergency Medicine | Admitting: Emergency Medicine

## 2024-10-06 VITALS — BP 130/66 | HR 93 | Ht 67.0 in | Wt 181.8 lb

## 2024-10-06 DIAGNOSIS — Z951 Presence of aortocoronary bypass graft: Secondary | ICD-10-CM | POA: Diagnosis not present

## 2024-10-06 DIAGNOSIS — I2510 Atherosclerotic heart disease of native coronary artery without angina pectoris: Secondary | ICD-10-CM

## 2024-10-06 DIAGNOSIS — I10 Essential (primary) hypertension: Secondary | ICD-10-CM | POA: Diagnosis not present

## 2024-10-06 DIAGNOSIS — I5020 Unspecified systolic (congestive) heart failure: Secondary | ICD-10-CM | POA: Diagnosis not present

## 2024-10-06 DIAGNOSIS — E785 Hyperlipidemia, unspecified: Secondary | ICD-10-CM | POA: Diagnosis not present

## 2024-10-06 NOTE — Assessment & Plan Note (Signed)
 STEMI-PCI-LAD in 2001, LHC 08/14/2024: Total occlusion of RCA, LAD in-stent restenosis 80%. 09/12/2024 LIMA-LAD, SVG-PDA EKG: 93 bpm, NSR, old septal infarct, T wave abnormality, otherwise no significant change Denies chest pain, shortness of breath, orthopnea, edema Denies missing any doses of her Plavix , no ASA 2/2 allergies Incision appears to be healing well on chest and right thigh.  No redness, warmth, swelling, or drainage observed. Appears to be healing well, no cardiovascular symptoms, EKG did show new T wave abnormality.  Discussed this with DOD, Dr. Shlomo, given patient is asymptomatic will continue to monitor with close follow-up. Continue bisoprolol -hydrochlorothiazide  10-6.25, Plavix  75 mg daily, rosuvastatin  20 mg daily

## 2024-10-06 NOTE — Assessment & Plan Note (Signed)
 BPs reported well-controlled at home Continue bisoprolol -hydrochlorothiazide , Farxiga 10 mg daily

## 2024-10-06 NOTE — Assessment & Plan Note (Signed)
 Update fasting lipid panel LFTs 09/08/2024: AST 22, ALT 9 Continue Crestor  20 mg daily

## 2024-10-06 NOTE — Patient Instructions (Signed)
 Medication Instructions:  NO CHANGES *If you need a refill on your cardiac medications before your next appointment, please call your pharmacy*  Lab Work: FASTING LIPID PANEL WITHIN 1 WEEK If you have labs (blood work) drawn today and your tests are completely normal, you will receive your results only by: MyChart Message (if you have MyChart) OR A paper copy in the mail If you have any lab test that is abnormal or we need to change your treatment, we will call you to review the results.  Testing/Procedures: NO TESTING  Follow-Up: At [facility], you and your health needs are our priority.  As part of our continuing mission to provide you with exceptional heart care, our providers are all part of one team.  This team includes your primary Cardiologist (physician) and Advanced Practice Providers or APPs (Physician Assistants and Nurse Practitioners) who all work together to provide you with the care you need, when you need it.  Your next appointment:   1 month(s)  Provider:   Miriam Shams, NP   Other Instructions

## 2024-10-09 ENCOUNTER — Other Ambulatory Visit: Payer: Self-pay

## 2024-10-09 DIAGNOSIS — Z951 Presence of aortocoronary bypass graft: Secondary | ICD-10-CM

## 2024-10-10 ENCOUNTER — Ambulatory Visit (HOSPITAL_COMMUNITY)
Admission: RE | Admit: 2024-10-10 | Discharge: 2024-10-10 | Disposition: A | Discharge status: Home / Self Care | Source: Ambulatory Visit | Attending: Cardiology | Admitting: Cardiology

## 2024-10-10 ENCOUNTER — Ambulatory Visit

## 2024-10-10 VITALS — BP 138/82 | HR 92 | Resp 18 | Ht 67.0 in | Wt 180.0 lb

## 2024-10-10 DIAGNOSIS — I25119 Atherosclerotic heart disease of native coronary artery with unspecified angina pectoris: Secondary | ICD-10-CM

## 2024-10-10 DIAGNOSIS — Z951 Presence of aortocoronary bypass graft: Secondary | ICD-10-CM | POA: Diagnosis present

## 2024-10-10 LAB — LIPID PANEL
Chol/HDL Ratio: 4.1 ratio (ref 0.0–4.4)
Cholesterol, Total: 178 mg/dL (ref 100–199)
HDL: 43 mg/dL
LDL Chol Calc (NIH): 86 mg/dL (ref 0–99)
Triglycerides: 300 mg/dL — ABNORMAL HIGH (ref 0–149)
VLDL Cholesterol Cal: 49 mg/dL — ABNORMAL HIGH (ref 5–40)

## 2024-10-10 NOTE — Progress Notes (Signed)
 "     [address] [place] [address]             [phone]       HPI:  Patient returns for routine postoperative follow-up having undergone  CABG x 2 (SVG - PDA, LIMA - LAD) and endoscopic Vein Harvest  on 09/12/2024 with Dr. Daniel.  The patient's early postoperative recovery while in the hospital was notable for being routinely diuresed postoperatively.  She was resumed on home bisoprolol  10mg /hydrochlorothiazide  for heart rate and blood pressure control.  She was stable for discharge home on 09/18/2024 with home physical therapy.  She presents for continued follow up.  Her pain is well controlled and she is not requiring any medications in the day.  She does take tylenol  as needed at bed time.  She does use oxycodone  at night occasionally but this is not often.  Her incision sites have been healing appropriately.  She has been active with walking and doing the stairs in her home. She denies chest pain, shortness of breath and lower leg edema. She continues to remain vape free. .    Allergies as of 10/10/2024       Reactions   Penicillins Anaphylaxis   Aspirin  Hives   Nsaids Hives   Latex Rash        Medication List        Accurate as of October 10, 2024 12:28 PM. If you have any questions, ask your nurse or doctor.          Accu-Chek Aviva Plus test strip Generic drug: glucose blood SMARTSIG:1 Strip(s) Via Meter Every Morning   Accu-Chek Aviva Plus w/Device Kit Use 1 kit as directed once a day  to check fasting blood sugar for diabetes   Accu-Chek Softclix Lancets lancets SMARTSIG:Topical   albuterol  108 (90 Base) MCG/ACT inhaler Commonly known as: VENTOLIN  HFA Inhale 2 puffs into the lungs every 4 (four) hours as needed for wheezing or shortness of breath (cough, shortness of breath or wheezing.).   ascorbic acid 500 MG tablet Commonly known as: VITAMIN C Take 500 mg by mouth daily.   bisoprolol -hydrochlorothiazide  10-6.25 MG  tablet Commonly known as: ZIAC Take 1 tablet by mouth daily.   CINNAMON PO Take 1,000 mg by mouth daily.   clopidogrel  75 MG tablet Commonly known as: PLAVIX  Take 1 tablet (75 mg total) by mouth daily. Per Dr Sherrlyn take Plavix  until 08/27/24 then discontinue   cyanocobalamin 1000 MCG tablet Commonly known as: VITAMIN B12 Take 1,000 mcg by mouth in the morning and at bedtime.   cyclobenzaprine  5 MG tablet Commonly known as: FLEXERIL  Take 5 mg by mouth at bedtime.   doxylamine (Sleep) 25 MG tablet Commonly known as: UNISOM Take 25 mg by mouth at bedtime.   DULoxetine  60 MG capsule Commonly known as: CYMBALTA  Take 60 mg by mouth daily.   ergocalciferol  1.25 MG (50000 UT) capsule Commonly known as: VITAMIN D2 Take 50,000 Units by mouth once a week.   Farxiga 10 MG Tabs tablet Generic drug: dapagliflozin propanediol Take 10 mg by mouth daily.   gabapentin  600 MG tablet Commonly known as: NEURONTIN  Take 600 mg by mouth at bedtime.   ipratropium 0.03 % nasal spray Commonly known as: ATROVENT  Place 2 sprays into both nostrils 2 (two) times daily as needed for rhinitis.   lansoprazole 30 MG capsule Commonly known as: PREVACID Take 30 mg by mouth daily.   levocetirizine 5 MG  tablet Commonly known as: XYZAL  Take 5 mg by mouth daily.   levothyroxine  25 MCG tablet Commonly known as: SYNTHROID  Take 25 mcg by mouth daily before breakfast.   Ozempic (2 MG/DOSE) 8 MG/3ML Sopn Generic drug: Semaglutide (2 MG/DOSE) Inject 2 mg into the skin once a week.   rosuvastatin  20 MG tablet Commonly known as: CRESTOR  Take 20 mg by mouth daily.   TURMERIC PO Take 1,000 mg by mouth daily. Complex         ROS Review of Systems  Constitutional: Negative.  Negative for fever and malaise/fatigue.  Respiratory: Negative.  Negative for cough and shortness of breath.   Cardiovascular:  Negative for chest pain, palpitations and leg swelling.      BP 138/82 (BP  Location: Right Arm)   Pulse 92   Resp 18   Ht 5' 7 (1.702 m)   Wt 180 lb (81.6 kg)   SpO2 97% Comment: RA  BMI 28.19 kg/m   Physical Exam Constitutional:      Appearance: Normal appearance.  HENT:     Head: Normocephalic and atraumatic.  Cardiovascular:     Rate and Rhythm: Normal rate and regular rhythm.     Heart sounds: Normal heart sounds, S1 normal and S2 normal.  Pulmonary:     Effort: Pulmonary effort is normal.     Breath sounds: Normal breath sounds.  Musculoskeletal:     Cervical back: Normal range of motion.     Right lower leg: No edema.     Left lower leg: No edema.  Skin:    General: Skin is warm and dry.     Comments: Incision sites without erythema or drainage  Neurological:     General: No focal deficit present.     Mental Status: She is alert.       Imaging: CLINICAL DATA:  CABG x2.   EXAM: DG CHEST 2V   COMPARISON:  09/26/2024   FINDINGS: Lungs are adequately inflated with improved left base with subtle residual density which may represent atelectasis/pleural fluid. Right lung is clear. Cardiomediastinal silhouette and remainder of the exam is unchanged.   IMPRESSION: Improved left base with subtle residual density which may represent atelectasis/pleural fluid.     Electronically Signed   By: Toribio Agreste M.D.   On: 10/10/2024 09:38   Assessment/Plan:  S/P CABG x 2 -We reviewed today's chest x ray, which showed improvement of pleural effusions.  -She is to continue sternal precautions until a full 6 weeks from surgery, continue to not lift over 10 pounds until a full 3 months from surgery -We discussed driving and she is able to start. First time driving should be a short distance in the day time and she can increase from there -Discussed participation in cardiac rehab and she is cleared at this time -She can continue to increase her activity as tolerated -Congratulated her on her vaping cessation and she should continue  this -She has been seen by cardiology and she is to continue with bisoprolol -hydrochlorothiazide , Plavix , rosuvastatin , and Doreen -She has follow up with PCP on 10/11/2024 for diabetes management  -Follow up with [facility] as needed    Manuelita CHRISTELLA Rough, PA-C 12:28 PM 10/10/2024  "

## 2024-10-10 NOTE — Patient Instructions (Signed)

## 2024-10-11 ENCOUNTER — Ambulatory Visit: Payer: Self-pay | Admitting: Emergency Medicine

## 2024-10-11 DIAGNOSIS — Z79899 Other long term (current) drug therapy: Secondary | ICD-10-CM

## 2024-10-11 MED ORDER — ROSUVASTATIN CALCIUM 40 MG PO TABS: 40.0000 mg | ORAL_TABLET | Freq: Every day | ORAL | 3 refills | Status: AC

## 2024-10-11 NOTE — Telephone Encounter (Addendum)
 Called patient in regards to labs results, patient verbalized understanding of results and wants to know if she should keep appointment 2/17 or have it pushed back to after the labs are done in 2 months, told patient I would check with provider and after being advised I will inform patient of provider answer, sent prescription to pharmacy, lab order placed  ----- Message from Miriam Shams, NP sent at 10/11/2024  6:59 AM EST ----- LDL is above goal of 55.  Increase Crestor  to 40 mg daily, repeat fasting lipids and LFTs in 2 months.  Follow-up as planned.  Notify the office of any new muscle aches or fatigue following increase  in Crestor  dosing.  Result team: Please order labs and meds as above

## 2024-11-14 ENCOUNTER — Ambulatory Visit: Admitting: Emergency Medicine
